# Patient Record
Sex: Female | Born: 1935 | Race: Black or African American | Hispanic: No | Marital: Married | Smoking: Never smoker
Health system: Southern US, Community
[De-identification: ages and names within clinical notes are randomized; demographics above are authoritative.]

## PROBLEM LIST (undated history)

## (undated) ENCOUNTER — Emergency Department (HOSPITAL_COMMUNITY): Discharge status: Absent without leave | Payer: Self-pay

## (undated) DIAGNOSIS — E0590 Thyrotoxicosis, unspecified without thyrotoxic crisis or storm: Secondary | ICD-10-CM

## (undated) DIAGNOSIS — K449 Diaphragmatic hernia without obstruction or gangrene: Secondary | ICD-10-CM

## (undated) DIAGNOSIS — K284 Chronic or unspecified gastrojejunal ulcer with hemorrhage: Secondary | ICD-10-CM

## (undated) DIAGNOSIS — I712 Thoracic aortic aneurysm, without rupture: Secondary | ICD-10-CM

## (undated) DIAGNOSIS — I10 Essential (primary) hypertension: Secondary | ICD-10-CM

## (undated) DIAGNOSIS — C801 Malignant (primary) neoplasm, unspecified: Secondary | ICD-10-CM

## (undated) DIAGNOSIS — I499 Cardiac arrhythmia, unspecified: Secondary | ICD-10-CM

## (undated) DIAGNOSIS — K274 Chronic or unspecified peptic ulcer, site unspecified, with hemorrhage: Secondary | ICD-10-CM

## (undated) HISTORY — DX: Chronic or unspecified gastrojejunal ulcer with hemorrhage: K28.4

## (undated) HISTORY — DX: Thoracic aortic aneurysm, without rupture: I71.2

## (undated) HISTORY — PX: UMBILICAL HERNIA REPAIR: SHX196

## (undated) HISTORY — DX: Cardiac arrhythmia, unspecified: I49.9

## (undated) HISTORY — PX: ANTERIOR CERVICAL DECOMP/DISCECTOMY FUSION: SHX1161

## (undated) HISTORY — PX: NECK SURGERY: SHX720

## (undated) HISTORY — DX: Thyrotoxicosis, unspecified without thyrotoxic crisis or storm: E05.90

## (undated) HISTORY — PX: ROTATOR CUFF REPAIR: SHX139

## (undated) HISTORY — PX: TUBAL LIGATION: SHX77

## (undated) HISTORY — DX: Diaphragmatic hernia without obstruction or gangrene: K44.9

## (undated) HISTORY — DX: Chronic or unspecified peptic ulcer, site unspecified, with hemorrhage: K27.4

---

## 1998-12-05 ENCOUNTER — Emergency Department (HOSPITAL_COMMUNITY): Admission: EM | Admit: 1998-12-05 | Discharge: 1998-12-05 | Payer: Self-pay | Admitting: Emergency Medicine

## 1998-12-05 ENCOUNTER — Encounter: Payer: Self-pay | Admitting: Emergency Medicine

## 1999-05-08 ENCOUNTER — Emergency Department (HOSPITAL_COMMUNITY): Admission: EM | Admit: 1999-05-08 | Discharge: 1999-05-08 | Payer: Self-pay | Admitting: Emergency Medicine

## 1999-10-08 ENCOUNTER — Other Ambulatory Visit: Admission: RE | Admit: 1999-10-08 | Discharge: 1999-10-08 | Payer: Self-pay | Admitting: *Deleted

## 2000-04-12 ENCOUNTER — Other Ambulatory Visit: Admission: RE | Admit: 2000-04-12 | Discharge: 2000-04-12 | Payer: Self-pay | Admitting: Obstetrics and Gynecology

## 2000-05-10 ENCOUNTER — Encounter (INDEPENDENT_AMBULATORY_CARE_PROVIDER_SITE_OTHER): Payer: Self-pay | Admitting: Specialist

## 2000-05-10 ENCOUNTER — Other Ambulatory Visit: Admission: RE | Admit: 2000-05-10 | Discharge: 2000-05-10 | Payer: Self-pay | Admitting: Obstetrics and Gynecology

## 2000-10-10 ENCOUNTER — Other Ambulatory Visit: Admission: RE | Admit: 2000-10-10 | Discharge: 2000-10-10 | Payer: Self-pay | Admitting: Obstetrics and Gynecology

## 2002-11-19 ENCOUNTER — Encounter: Payer: Self-pay | Admitting: Internal Medicine

## 2002-11-19 ENCOUNTER — Ambulatory Visit (HOSPITAL_COMMUNITY): Admission: RE | Admit: 2002-11-19 | Discharge: 2002-11-19 | Payer: Self-pay | Admitting: Internal Medicine

## 2002-11-27 ENCOUNTER — Encounter: Payer: Self-pay | Admitting: Internal Medicine

## 2002-11-27 ENCOUNTER — Ambulatory Visit (HOSPITAL_COMMUNITY): Admission: RE | Admit: 2002-11-27 | Discharge: 2002-11-27 | Payer: Self-pay | Admitting: Internal Medicine

## 2003-05-02 ENCOUNTER — Ambulatory Visit (HOSPITAL_COMMUNITY): Admission: RE | Admit: 2003-05-02 | Discharge: 2003-05-02 | Payer: Self-pay | Admitting: Internal Medicine

## 2003-05-02 ENCOUNTER — Encounter (INDEPENDENT_AMBULATORY_CARE_PROVIDER_SITE_OTHER): Payer: Self-pay | Admitting: Specialist

## 2003-05-02 ENCOUNTER — Encounter: Payer: Self-pay | Admitting: Internal Medicine

## 2003-09-19 ENCOUNTER — Emergency Department (HOSPITAL_COMMUNITY): Admission: EM | Admit: 2003-09-19 | Discharge: 2003-09-19 | Payer: Self-pay | Admitting: Emergency Medicine

## 2004-11-03 ENCOUNTER — Ambulatory Visit (HOSPITAL_COMMUNITY): Admission: RE | Admit: 2004-11-03 | Discharge: 2004-11-03 | Payer: Self-pay | Admitting: Orthopedic Surgery

## 2004-11-26 ENCOUNTER — Encounter: Admission: RE | Admit: 2004-11-26 | Discharge: 2004-11-26 | Payer: Self-pay | Admitting: Orthopedic Surgery

## 2004-12-17 ENCOUNTER — Ambulatory Visit: Payer: Self-pay | Admitting: *Deleted

## 2004-12-17 ENCOUNTER — Encounter (INDEPENDENT_AMBULATORY_CARE_PROVIDER_SITE_OTHER): Payer: Self-pay | Admitting: *Deleted

## 2005-03-01 ENCOUNTER — Encounter: Admission: RE | Admit: 2005-03-01 | Discharge: 2005-03-01 | Payer: Self-pay | Admitting: Orthopedic Surgery

## 2005-04-30 ENCOUNTER — Emergency Department (HOSPITAL_COMMUNITY): Admission: EM | Admit: 2005-04-30 | Discharge: 2005-04-30 | Payer: Self-pay | Admitting: Emergency Medicine

## 2005-09-07 ENCOUNTER — Encounter: Admission: RE | Admit: 2005-09-07 | Discharge: 2005-09-07 | Payer: Self-pay | Admitting: Internal Medicine

## 2005-10-11 ENCOUNTER — Encounter: Admission: RE | Admit: 2005-10-11 | Discharge: 2005-10-11 | Payer: Self-pay | Admitting: Internal Medicine

## 2006-02-19 ENCOUNTER — Emergency Department (HOSPITAL_COMMUNITY): Admission: EM | Admit: 2006-02-19 | Discharge: 2006-02-19 | Payer: Self-pay | Admitting: Family Medicine

## 2006-08-18 ENCOUNTER — Emergency Department (HOSPITAL_COMMUNITY): Admission: EM | Admit: 2006-08-18 | Discharge: 2006-08-18 | Payer: Self-pay | Admitting: Family Medicine

## 2006-08-28 ENCOUNTER — Emergency Department (HOSPITAL_COMMUNITY): Admission: EM | Admit: 2006-08-28 | Discharge: 2006-08-28 | Payer: Self-pay | Admitting: Family Medicine

## 2006-09-01 ENCOUNTER — Encounter: Admission: RE | Admit: 2006-09-01 | Discharge: 2006-09-01 | Payer: Self-pay | Admitting: Internal Medicine

## 2006-10-02 ENCOUNTER — Encounter: Admission: RE | Admit: 2006-10-02 | Discharge: 2006-10-02 | Payer: Self-pay | Admitting: Internal Medicine

## 2006-10-02 ENCOUNTER — Other Ambulatory Visit: Admission: RE | Admit: 2006-10-02 | Discharge: 2006-10-02 | Payer: Self-pay | Admitting: Interventional Radiology

## 2006-10-02 ENCOUNTER — Encounter (INDEPENDENT_AMBULATORY_CARE_PROVIDER_SITE_OTHER): Payer: Self-pay | Admitting: Specialist

## 2006-11-13 ENCOUNTER — Encounter (HOSPITAL_COMMUNITY): Admission: RE | Admit: 2006-11-13 | Discharge: 2007-01-30 | Payer: Self-pay | Admitting: Endocrinology

## 2007-03-05 IMAGING — CR DG CHEST 2V
2 series · 2 of 2 positions shown · non-contrast
Comparison: No priors for comparison.

CLINICAL DATA: Cough and congestion for a couple of weeks.
CHEST- 2 VIEWS - 08/28/06:

[view not recorded (1 of 2)]
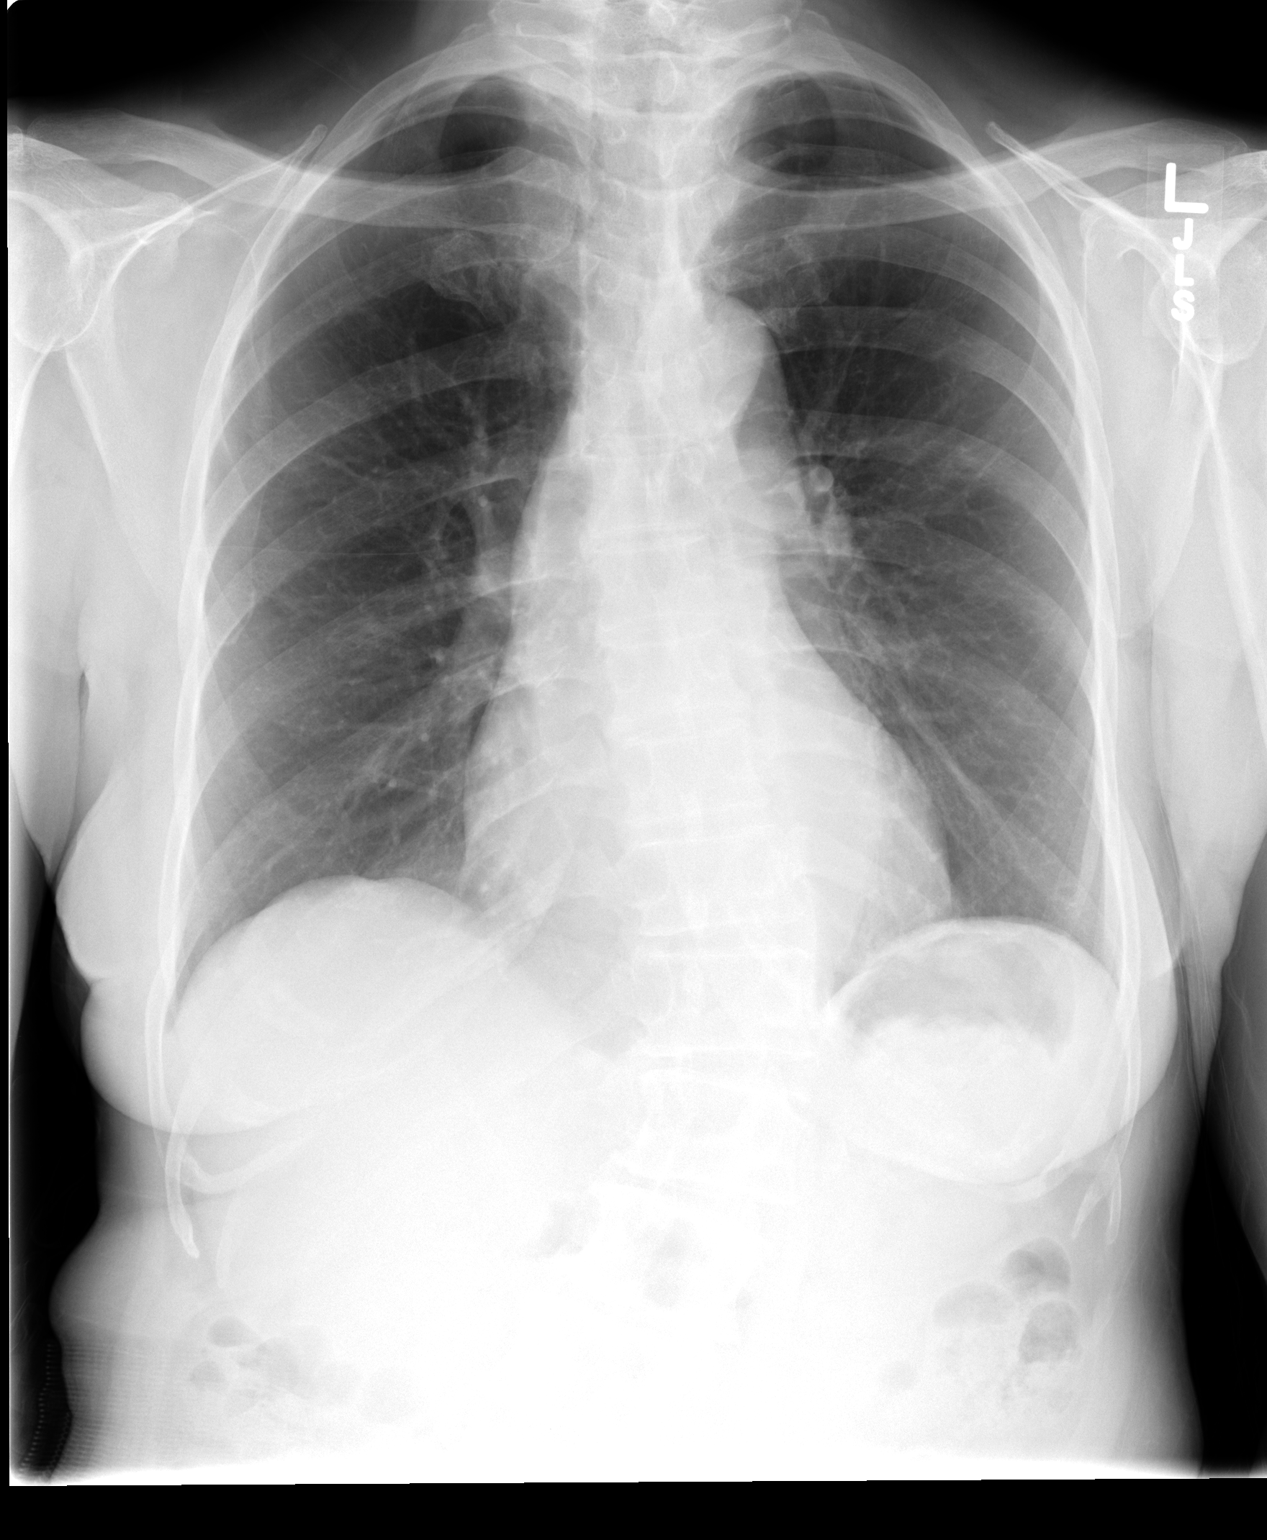

[view not recorded (2 of 2)]
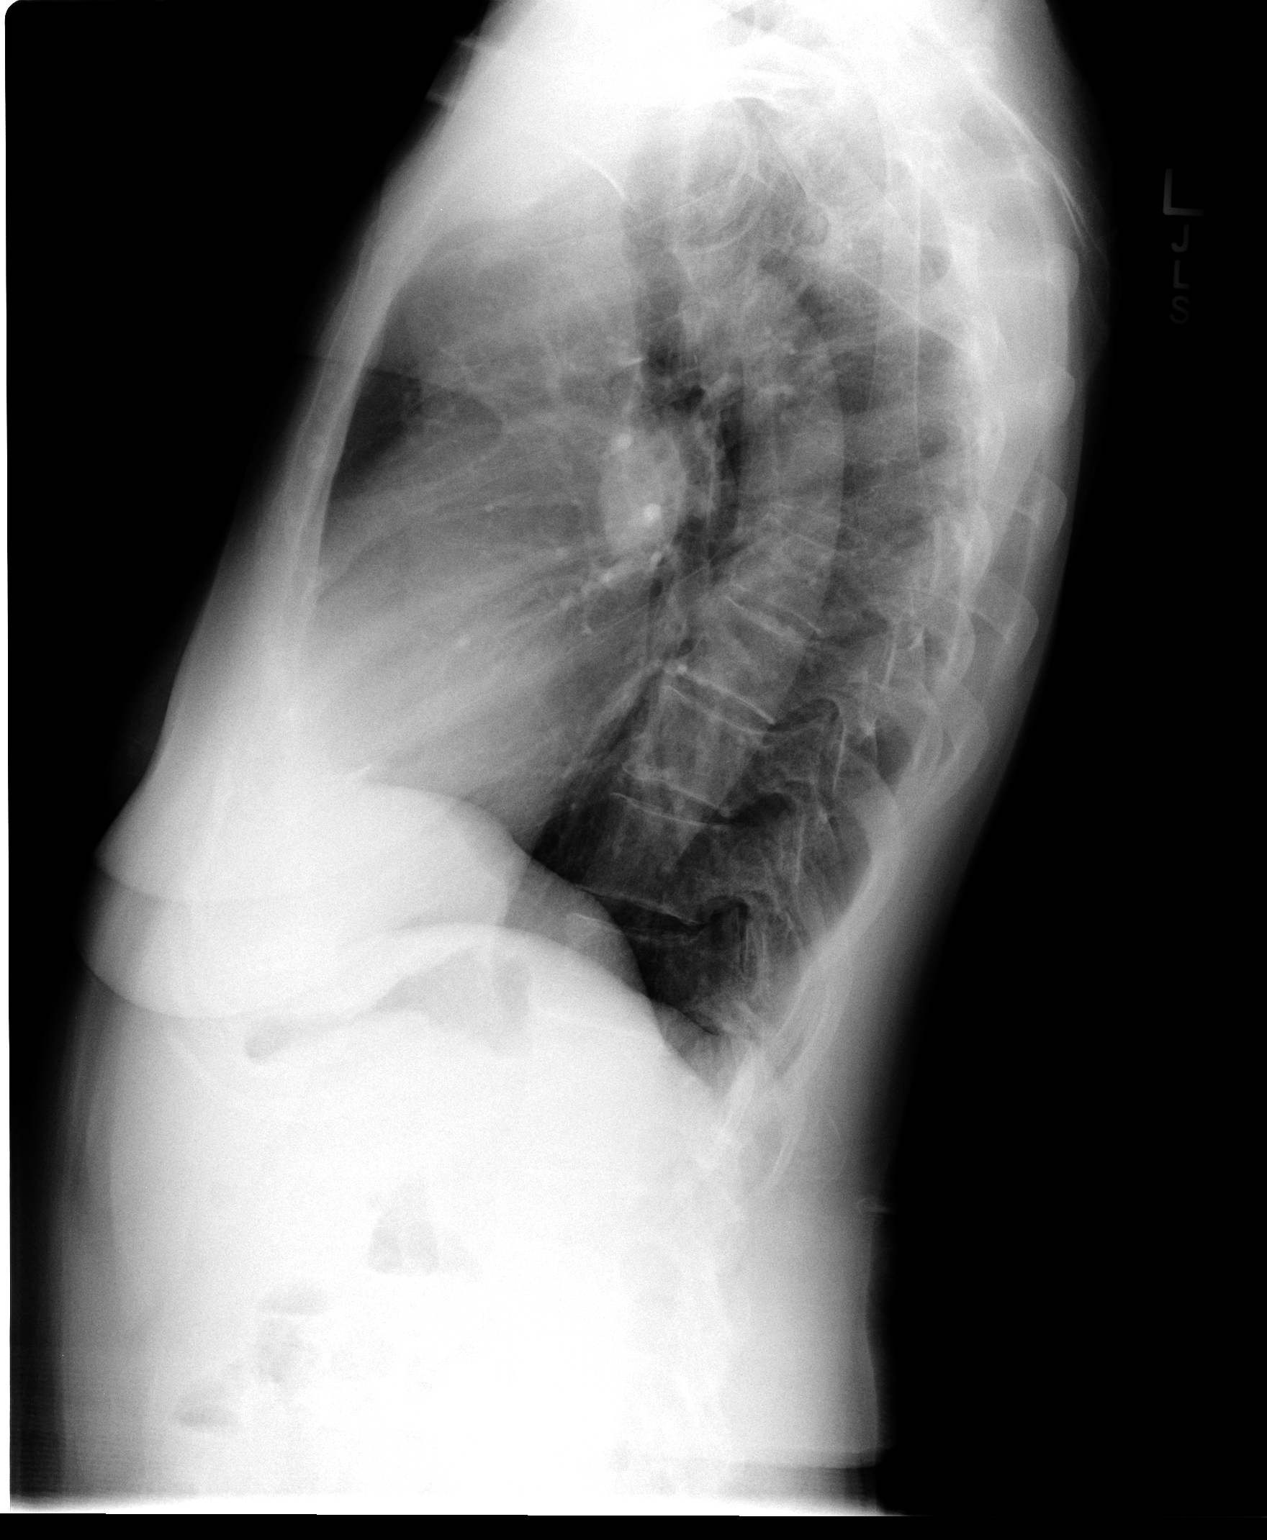

[2 of 2 positions shown; findings below may reference images not displayed]

FINDINGS: There is lower thoracic/upper lumbar rotoscoliosis convex to the left.  The lungs are well expanded and clear of an active process.  The cardiomediastinal silhouette is unremarkable.  Cardiac size is towards the upper limits of normal.
IMPRESSION: No active cardiopulmonary disease. 
Thoracolumbar scoliosis.

## 2007-08-14 ENCOUNTER — Encounter: Admission: RE | Admit: 2007-08-14 | Discharge: 2007-08-14 | Payer: Self-pay | Admitting: Internal Medicine

## 2008-05-05 ENCOUNTER — Emergency Department (HOSPITAL_COMMUNITY): Admission: EM | Admit: 2008-05-05 | Discharge: 2008-05-05 | Payer: Self-pay | Admitting: Family Medicine

## 2009-01-15 ENCOUNTER — Encounter: Admission: RE | Admit: 2009-01-15 | Discharge: 2009-01-15 | Payer: Self-pay | Admitting: Family Medicine

## 2009-01-28 ENCOUNTER — Encounter: Admission: RE | Admit: 2009-01-28 | Discharge: 2009-01-28 | Payer: Self-pay | Admitting: Family Medicine

## 2009-02-10 ENCOUNTER — Encounter: Admission: RE | Admit: 2009-02-10 | Discharge: 2009-02-10 | Payer: Self-pay | Admitting: Family Medicine

## 2009-02-10 ENCOUNTER — Encounter (INDEPENDENT_AMBULATORY_CARE_PROVIDER_SITE_OTHER): Payer: Self-pay | Admitting: Diagnostic Radiology

## 2009-02-10 HISTORY — PX: BREAST BIOPSY: SHX20

## 2009-05-25 ENCOUNTER — Encounter: Payer: Self-pay | Admitting: Cardiology

## 2010-05-25 ENCOUNTER — Emergency Department (HOSPITAL_COMMUNITY): Admission: EM | Admit: 2010-05-25 | Discharge: 2010-05-25 | Payer: Self-pay | Admitting: Family Medicine

## 2010-08-29 ENCOUNTER — Encounter: Payer: Self-pay | Admitting: Family Medicine

## 2010-08-29 ENCOUNTER — Encounter: Payer: Self-pay | Admitting: *Deleted

## 2010-09-08 ENCOUNTER — Encounter: Payer: Self-pay | Admitting: Family Medicine

## 2010-12-07 ENCOUNTER — Inpatient Hospital Stay (INDEPENDENT_AMBULATORY_CARE_PROVIDER_SITE_OTHER)
Admission: RE | Admit: 2010-12-07 | Discharge: 2010-12-07 | Disposition: A | Discharge status: Home / Self Care | Payer: Medicare Other | Source: Ambulatory Visit | Attending: Family Medicine | Admitting: Family Medicine

## 2010-12-07 DIAGNOSIS — J069 Acute upper respiratory infection, unspecified: Secondary | ICD-10-CM

## 2010-12-24 NOTE — Group Therapy Note (Signed)
NAME:  Rebecca Estes, Rebecca Estes              ACCOUNT NO.:  [id number]   MEDICAL RECORD NO.:  [id number]          PATIENT TYPE:  WOC   LOCATION:  [facility]                   FACILITY:  [facility]   PHYSICIAN:  Ellis Parents, MD    DATE OF BIRTH:  07-17-1934   DATE OF SERVICE:                                    CLINIC NOTE   HISTORY OF PRESENT ILLNESS:  A 77 year old multiparous, postmenopausal  patient who comes in for a routine Pap smear.  In addition, she complains of  some occasional mild back pain and lower abdominal fullness.  She denies any  urinary symptoms.  She has nocturia x 1.  She has no stress incontinence.  She voids well with good volumes.  Blood pressure 177/103, and the patient  is currently on an antihypertensive called Novax.  According to the patient,  she had a mammogram one year ago.   PHYSICAL EXAMINATION:  GENITOURINARY:  External genitalia are normal.  There  is a whitish discoloration on the interior surface of both of the labia  minora which is smooth and not ulcerated.  The vagina is atrophic.  The  cervix is clean.  The uterus is anterior, and normal size.  Both adnexa are  soft.  There is no evidence of any pelvic masses or induration.   MEDICAL DECISION MAKING:  The area on the labia minora appears to grossly  represent lichen sclerosus.  The patient was advised of this factor, and she  was also advised to return to her medical doctor to get her blood pressure  stabilized.      SA/MEDQ  D:  12/17/2004  T:  12/17/2004  Job:  [id number]

## 2011-03-10 ENCOUNTER — Emergency Department (HOSPITAL_COMMUNITY)
Admission: EM | Admit: 2011-03-10 | Discharge: 2011-03-11 | Disposition: A | Discharge status: Home / Self Care | Payer: Medicare Other | Attending: Emergency Medicine | Admitting: Emergency Medicine

## 2011-03-10 DIAGNOSIS — I10 Essential (primary) hypertension: Secondary | ICD-10-CM | POA: Insufficient documentation

## 2011-03-10 DIAGNOSIS — M810 Age-related osteoporosis without current pathological fracture: Secondary | ICD-10-CM | POA: Insufficient documentation

## 2011-03-10 DIAGNOSIS — K219 Gastro-esophageal reflux disease without esophagitis: Secondary | ICD-10-CM | POA: Insufficient documentation

## 2011-03-10 DIAGNOSIS — I6789 Other cerebrovascular disease: Secondary | ICD-10-CM | POA: Insufficient documentation

## 2011-03-10 DIAGNOSIS — I498 Other specified cardiac arrhythmias: Secondary | ICD-10-CM | POA: Insufficient documentation

## 2011-03-10 HISTORY — DX: Essential (primary) hypertension: I10

## 2011-03-11 ENCOUNTER — Emergency Department (HOSPITAL_COMMUNITY): Payer: Medicare Other

## 2011-03-11 ENCOUNTER — Encounter (HOSPITAL_COMMUNITY): Payer: Self-pay | Admitting: Radiology

## 2011-05-09 LAB — WET PREP, GENITAL
Trich, Wet Prep: NONE SEEN
Yeast Wet Prep HPF POC: NONE SEEN

## 2011-05-09 LAB — POCT URINALYSIS DIP (DEVICE)
Bilirubin Urine: NEGATIVE
Hgb urine dipstick: NEGATIVE
Ketones, ur: NEGATIVE
Protein, ur: NEGATIVE
Urobilinogen, UA: 0.2
pH: 5

## 2011-05-09 LAB — URINE CULTURE

## 2011-07-16 ENCOUNTER — Encounter (HOSPITAL_COMMUNITY): Payer: Self-pay | Admitting: Emergency Medicine

## 2011-07-16 ENCOUNTER — Emergency Department (INDEPENDENT_AMBULATORY_CARE_PROVIDER_SITE_OTHER)
Admission: EM | Admit: 2011-07-16 | Discharge: 2011-07-16 | Disposition: A | Discharge status: Home / Self Care | Payer: Medicare Other | Source: Home / Self Care | Attending: Family Medicine | Admitting: Family Medicine

## 2011-07-16 DIAGNOSIS — T310 Burns involving less than 10% of body surface: Secondary | ICD-10-CM

## 2011-07-16 MED ORDER — SILVER SULFADIAZINE 1 % EX CREA: TOPICAL_CREAM | Freq: Two times a day (BID) | CUTANEOUS | Status: AC
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
## 2011-10-14 ENCOUNTER — Encounter: Payer: Self-pay | Admitting: Internal Medicine

## 2012-10-25 ENCOUNTER — Encounter: Payer: Self-pay | Admitting: Cardiology

## 2015-04-06 ENCOUNTER — Encounter (HOSPITAL_COMMUNITY): Payer: Self-pay | Admitting: Emergency Medicine

## 2015-04-06 ENCOUNTER — Emergency Department (HOSPITAL_COMMUNITY): Payer: PPO

## 2015-04-06 ENCOUNTER — Emergency Department (HOSPITAL_COMMUNITY)
Admission: EM | Admit: 2015-04-06 | Discharge: 2015-04-06 | Disposition: A | Discharge status: Home / Self Care | Payer: PPO | Attending: Emergency Medicine | Admitting: Emergency Medicine

## 2015-04-06 DIAGNOSIS — M62838 Other muscle spasm: Secondary | ICD-10-CM | POA: Diagnosis not present

## 2015-04-06 DIAGNOSIS — Z8711 Personal history of peptic ulcer disease: Secondary | ICD-10-CM | POA: Insufficient documentation

## 2015-04-06 DIAGNOSIS — I10 Essential (primary) hypertension: Secondary | ICD-10-CM | POA: Insufficient documentation

## 2015-04-06 DIAGNOSIS — M810 Age-related osteoporosis without current pathological fracture: Secondary | ICD-10-CM | POA: Insufficient documentation

## 2015-04-06 DIAGNOSIS — Z8719 Personal history of other diseases of the digestive system: Secondary | ICD-10-CM | POA: Diagnosis not present

## 2015-04-06 DIAGNOSIS — Z79899 Other long term (current) drug therapy: Secondary | ICD-10-CM | POA: Diagnosis not present

## 2015-04-06 DIAGNOSIS — Z8349 Family history of other endocrine, nutritional and metabolic diseases: Secondary | ICD-10-CM | POA: Diagnosis not present

## 2015-04-06 DIAGNOSIS — Z720 Tobacco use: Secondary | ICD-10-CM | POA: Insufficient documentation

## 2015-04-06 DIAGNOSIS — M25512 Pain in left shoulder: Secondary | ICD-10-CM | POA: Diagnosis present

## 2015-04-06 MED ORDER — CYCLOBENZAPRINE HCL 10 MG PO TABS
10.0000 mg | ORAL_TABLET | Freq: Once | ORAL | Status: AC
Start: 2015-04-06 — End: 2015-04-06
  Administered 2015-04-06: 10 mg via ORAL
  Filled 2015-04-06: qty 1

## 2015-04-06 MED ORDER — CYCLOBENZAPRINE HCL 10 MG PO TABS: 10.0000 mg | ORAL_TABLET | Freq: Two times a day (BID) | ORAL | Status: DC | PRN

## 2015-04-06 NOTE — ED Provider Notes (Signed)
CSN: [id number]     Arrival date & time 04/06/15  0727 History   First MD Initiated Contact with Patient 04/06/15 [phone]     Chief Complaint  Patient presents with  . Shoulder Pain     (Consider location/radiation/quality/duration/timing/severity/associated sxs/prior Treatment) Patient is a 81 y.o. female presenting with shoulder pain. The history is provided by the patient.  Shoulder Pain Location:  Shoulder Time since incident:  6 days Injury: no   Shoulder location:  L shoulder Pain details:    Quality:  Aching   Radiates to:  Does not radiate   Severity:  Moderate   Timing:  Constant   Progression:  Unchanged Chronicity:  New Relieved by:  Nothing Worsened by:  Nothing tried Associated symptoms: no back pain, no fatigue and no fever     Past Medical History  Diagnosis Date  . Hypertension   . Hiatal hernia   . Arrhythmia   . Osteoporosis   . Bleeding ulcer   . Hyperthyroidism    Past Surgical History  Procedure Laterality Date  . Neck surgery    . Umbilical hernia repair    . Anterior cervical decomp/discectomy fusion      x3  . Rotator cuff repair      Left   Family History  Problem Relation Age of Onset  . Aneurysm Father 51    Brain  . Other Mother 49    Blood Infection  . Coronary artery disease Brother 62  . Stroke Brother 69  . Aneurysm Sister 42    Brain  . Cervical cancer Sister 5  . Kidney failure Sister 73    was on Dialysis  . Aneurysm Sister     Brain   Social History  Substance Use Topics  . Smoking status: Current Every Day Smoker  . Smokeless tobacco: None  . Alcohol Use: No   OB History    No data available     Review of Systems  Constitutional: Negative for fever and fatigue.  Respiratory: Negative for cough and shortness of breath.   Gastrointestinal: Negative for vomiting.  Musculoskeletal: Negative for back pain.  All other systems reviewed and are negative.     Allergies  Review of patient's allergies indicates  no known allergies.  Home Medications   Prior to Admission medications   Medication Sig Start Date End Date Taking? Authorizing Provider  alendronate (FOSAMAX) 10 MG tablet Take 10 mg by mouth daily before breakfast. Take with a full glass of water on an empty stomach.     Historical Provider, MD  amLODipine (NORVASC) 10 MG tablet Take 10 mg by mouth daily.      Historical Provider, MD  cholecalciferol (VITAMIN D) 1000 UNITS tablet Take 1,000 Units by mouth daily. Every 2 weeks     Historical Provider, MD  metoprolol (LOPRESSOR) 100 MG tablet Take 100 mg by mouth 1 day or 1 dose.      Historical Provider, MD   BP 167/81 mmHg  Pulse 76  Temp(Src) 97.8 F (36.6 C) (Oral)  Resp 18  Ht 5\' 5"  (1.651 m)  Wt 137 lb (62.143 kg)  BMI 22.80 kg/m2  SpO2 100% Physical Exam  Constitutional: She is oriented to person, place, and time. She appears well-developed and well-nourished. No distress.  HENT:  Head: Normocephalic and atraumatic.  Mouth/Throat: Oropharynx is clear and moist.  Eyes: EOM are normal. Pupils are equal, round, and reactive to light.  Neck: Normal range of motion. Neck supple.  Cardiovascular: Normal rate and regular rhythm.  Exam reveals no friction rub.   No murmur heard. Pulmonary/Chest: Effort normal and breath sounds normal. No respiratory distress. She has no wheezes. She has no rales.  Abdominal: Soft. She exhibits no distension. There is no tenderness. There is no rebound.  Musculoskeletal: Normal range of motion. She exhibits no edema.       Thoracic back: She exhibits tenderness (L lower trapezius, L lower latera trapezius.). She exhibits no spasm.  No rash  Neurological: She is alert and oriented to person, place, and time. No cranial nerve deficit. She exhibits normal muscle tone.  Skin: No rash noted. She is not diaphoretic.  Nursing note and vitals reviewed.   ED Course  Procedures (including critical care time) Labs Review Labs Reviewed - No data to  display  Imaging Review Dg Chest 2 View  04/06/2015   CLINICAL DATA:  Left scapular and posterior chest pain for 5 days. Initial encounter.  EXAM: CHEST  2 VIEW  COMPARISON:  PA and lateral chest 08/28/2006.  FINDINGS: The lungs are clear. Heart size is normal. No pneumothorax or pleural effusion. Convex left thoracolumbar scoliosis is unchanged.  IMPRESSION: No acute disease.   Electronically Signed   By: Inge Rise M.D.   On: 04/06/2015 08:29   I have personally reviewed and evaluated these images and lab results as part of my medical decision-making.   EKG Interpretation   Date/Time:  Monday April 06 2015 08:07:57 EDT Ventricular Rate:  67 PR Interval:  156 QRS Duration: 84 QT Interval:  406 QTC Calculation: 429 R Axis:   17 Text Interpretation:  Sinus rhythm Abnormal R-wave progression, early  transition No significant change since last tracing Confirmed by Mingo Amber   MD, Daune Divirgilio ([id number]) on 04/06/2015 8:11:00 AM      MDM   Final diagnoses:  Muscle spasm    81 year old female here with left posterior shoulder blade pain. Present for the past despite 6 days. Has been constant. Had mild relief with ibuprofen. Started after her son passed away. She denies any chest pain, fever, shortness of breath. No vomiting, abdominal pain. No falls. No exacerbating factors. Here vitals are stable. EKG is normal. She has mild left lower trapezius pain and muscular tenderness under the shoulder blade. Without any cardiac signs or symptoms, I feel like this is likely muscle tension secondary to stress with her son's recent death. We'll check a chest x-ray and give Flexeril.  CXR ok. Stable for discharge.     Evelina Bucy, MD 04/06/15 [phone]

## 2015-04-06 NOTE — ED Notes (Signed)
Pt states she normally gets this shoulder pain and she takes Ibuprofen and it goes away.  This morning the ibuprofen did not work.  Pt denies CP

## 2015-04-06 NOTE — ED Notes (Signed)
Discharge paperwork written on wrong patient MRN

## 2015-07-08 ENCOUNTER — Other Ambulatory Visit: Payer: Self-pay

## 2015-07-08 DIAGNOSIS — Z1231 Encounter for screening mammogram for malignant neoplasm of breast: Secondary | ICD-10-CM

## 2015-07-28 ENCOUNTER — Ambulatory Visit: Payer: PPO

## 2015-08-11 ENCOUNTER — Ambulatory Visit
Admission: RE | Admit: 2015-08-11 | Discharge: 2015-08-11 | Disposition: A | Discharge status: Home / Self Care | Payer: PPO | Source: Ambulatory Visit

## 2015-08-11 DIAGNOSIS — Z1231 Encounter for screening mammogram for malignant neoplasm of breast: Secondary | ICD-10-CM

## 2015-10-05 ENCOUNTER — Encounter (HOSPITAL_COMMUNITY): Payer: Self-pay | Admitting: Emergency Medicine

## 2015-10-05 ENCOUNTER — Emergency Department (INDEPENDENT_AMBULATORY_CARE_PROVIDER_SITE_OTHER)
Admission: EM | Admit: 2015-10-05 | Discharge: 2015-10-05 | Disposition: A | Discharge status: Home / Self Care | Payer: PPO | Source: Home / Self Care | Attending: Family Medicine | Admitting: Family Medicine

## 2015-10-05 ENCOUNTER — Emergency Department (INDEPENDENT_AMBULATORY_CARE_PROVIDER_SITE_OTHER): Payer: PPO

## 2015-10-05 DIAGNOSIS — R05 Cough: Secondary | ICD-10-CM | POA: Diagnosis not present

## 2015-10-05 DIAGNOSIS — J069 Acute upper respiratory infection, unspecified: Secondary | ICD-10-CM | POA: Diagnosis not present

## 2015-10-05 DIAGNOSIS — R509 Fever, unspecified: Secondary | ICD-10-CM | POA: Diagnosis not present

## 2015-10-05 MED ORDER — AMOXICILLIN 250 MG PO CAPS: 250.0000 mg | ORAL_CAPSULE | Freq: Three times a day (TID) | ORAL | Status: AC

## 2015-10-05 NOTE — ED Notes (Signed)
Pt has been suffering from a cough for one month.  She denies any other symptoms at this time and denies fever at any time.

## 2015-10-05 NOTE — ED Provider Notes (Signed)
CSN: [id number]     Arrival date & time 10/05/15  1426 History   First MD Initiated Contact with Patient 10/05/15 1623     Chief Complaint  Patient presents with  . Cough   (Consider location/radiation/quality/duration/timing/severity/associated sxs/prior Treatment) HPI Pt presents with the cc of cough for over 2 months Symptoms started months ago URI symptoms now has lingering cough Symptoms get worse with exertion Symptoms get better with rest and when she takes cough medicine No previous symptoms of this nature Pain score 1 Other symptoms include sputum production  Past Medical History  Diagnosis Date  . Hypertension   . Hiatal hernia   . Arrhythmia   . Osteoporosis   . Bleeding ulcer   . Hyperthyroidism    Past Surgical History  Procedure Laterality Date  . Neck surgery    . Umbilical hernia repair    . Anterior cervical decomp/discectomy fusion      x3  . Rotator cuff repair      Left   Family History  Problem Relation Age of Onset  . Aneurysm Father 8    Brain  . Other Mother 41    Blood Infection  . Coronary artery disease Brother 59  . Stroke Brother 28  . Aneurysm Sister 67    Brain  . Cervical cancer Sister 7  . Kidney failure Sister 61    was on Dialysis  . Aneurysm Sister     Brain   Social History  Substance Use Topics  . Smoking status: Former Research scientist (life sciences)  . Smokeless tobacco: None  . Alcohol Use: No   OB History    No data available     Review of Systems ROS +'ve cough  Denies: HEADACHE, NAUSEA, ABDOMINAL PAIN, CHEST PAIN, CONGESTION, DYSURIA, SHORTNESS OF BREATH  Allergies  Review of patient's allergies indicates no known allergies.  Home Medications   Prior to Admission medications   Medication Sig Start Date End Date Taking? Authorizing Provider  amLODipine (NORVASC) 10 MG tablet Take 10 mg by mouth daily.     Yes Historical Provider, MD  metoprolol (LOPRESSOR) 100 MG tablet Take 100 mg by mouth 1 day or 1 dose.     Yes  Historical Provider, MD  alendronate (FOSAMAX) 10 MG tablet Take 10 mg by mouth daily before breakfast. Take with a full glass of water on an empty stomach.     Historical Provider, MD  amoxicillin (AMOXIL) 250 MG capsule Take 1 capsule (250 mg total) by mouth 3 (three) times daily. 10/05/15 10/12/15  Konrad Felix, PA  cholecalciferol (VITAMIN D) 1000 UNITS tablet Take 1,000 Units by mouth daily. Every 2 weeks     Historical Provider, MD  cyclobenzaprine (FLEXERIL) 10 MG tablet Take 1 tablet (10 mg total) by mouth 2 (two) times daily as needed for muscle spasms. 04/06/15   Evelina Bucy, MD   Meds Ordered and Administered this Visit  Medications - No data to display  BP 152/85 mmHg  Pulse 57  Temp(Src) 98.2 F (36.8 C) (Oral)  Resp 16  SpO2 99% No data found.   Physical Exam NURSES NOTES AND VITAL SIGNS REVIEWED. CONSTITUTIONAL: Well developed, well nourished, no acute distress HEENT: normocephalic, atraumatic, right and left TM's are normal EYES: Conjunctiva normal NECK:normal ROM, supple, no adenopathy PULMONARY:No respiratory distress, normal effort, Lungs: CTAb/l, no wheezes, or increased work of breathing CARDIOVASCULAR: RRR, no murmur ABDOMEN: soft, ND, NT, +'ve BS MUSCULOSKELETAL: Normal ROM of all extremities,  SKIN: warm and dry  without rash PSYCHIATRIC: Mood and affect, behavior are normal  ED Course  Procedures (including critical care time)  Labs Review Labs Reviewed - No data to display  Imaging Review Dg Chest 2 View  10/05/2015  CLINICAL DATA:  Pt sick x 4 weeks, cough, chills, no fever, non smoker, hx of bronchitis, no asthma EXAM: CHEST  2 VIEW COMPARISON:  04/06/2015 FINDINGS: Cardiac silhouette top-normal in size. Aorta is mildly uncoiled. No mediastinal or hilar masses or evidence of adenopathy. Clear lungs.  No pleural effusion pneumothorax. Skeletal structures are intact. IMPRESSION: No active cardiopulmonary disease. Electronically Signed   By: Lajean Manes M.D.   On: 10/05/2015 16:34     Visual Acuity Review  Right Eye Distance:   Left Eye Distance:   Bilateral Distance:    Right Eye Near:   Left Eye Near:    Bilateral Near:        I have reviewed chest x-ray results with patient there is no acute pneumonia. Because of her age prophylaxis we'll treat with antibiotics. She should follow-up with her primary care provider also. MDM   1. URI (upper respiratory infection)    Patient is reassured that there are no issues that require transfer to higher level of care at this time.  Patient is advised to continue home symptomatic treatment. Prescription is sent to  pharmacy patient has indicated.  Patient is advised that if there are new or worsening symptoms or attend the emergency department, or contact primary care provider. Instructions of care provided discharged home in stable condition.    THIS NOTE WAS GENERATED USING A VOICE RECOGNITION SOFTWARE PROGRAM. ALL REASONABLE EFFORTS  WERE MADE TO PROOFREAD THIS DOCUMENT FOR ACCURACY.     Konrad Felix, [place] 10/05/15 [phone]

## 2015-10-05 NOTE — Discharge Instructions (Signed)

## 2016-03-10 ENCOUNTER — Encounter (HOSPITAL_COMMUNITY): Payer: Self-pay | Admitting: Emergency Medicine

## 2016-03-10 ENCOUNTER — Ambulatory Visit (HOSPITAL_COMMUNITY)
Admission: EM | Admit: 2016-03-10 | Discharge: 2016-03-10 | Disposition: A | Discharge status: Home / Self Care | Payer: PPO | Attending: Emergency Medicine | Admitting: Emergency Medicine

## 2016-03-10 DIAGNOSIS — R0982 Postnasal drip: Secondary | ICD-10-CM

## 2016-03-10 HISTORY — DX: Malignant (primary) neoplasm, unspecified: C80.1

## 2016-03-10 MED ORDER — LORATADINE 10 MG PO TABS: 10.0000 mg | ORAL_TABLET | Freq: Every day | ORAL | 0 refills | Status: DC

## 2016-03-10 MED ORDER — FLUTICASONE PROPIONATE 50 MCG/ACT NA SUSP: 2.0000 | Freq: Every day | NASAL | 0 refills | Status: DC

## 2016-03-10 MED ORDER — AMLODIPINE BESYLATE 10 MG PO TABS: 10.0000 mg | ORAL_TABLET | Freq: Every day | ORAL | 1 refills | Status: DC

## 2016-03-10 MED ORDER — METOPROLOL TARTRATE 100 MG PO TABS: 100.0000 mg | ORAL_TABLET | Freq: Every day | ORAL | 1 refills | Status: DC

## 2016-03-10 NOTE — ED Triage Notes (Signed)
PT reports a cold several weeks ago and now has ear and throat pain. PT also reports that she is off her BP med because her PCP has changed

## 2016-03-10 NOTE — ED Provider Notes (Signed)
[place]    CSN: [id number] Arrival date & time: 03/10/16  1438  First Provider Contact:  First MD Initiated Contact with Patient 03/10/16 1543        History   Chief Complaint Chief Complaint  Patient presents with  . Sore Throat    HPI LACARA Estes is a 82 y.o. female.   She is an 82 year old woman here for evaluation of throat drainage. She states since having an upper respiratory infection this winter, she has had persistent drainage. She states this is worse at night and gets better during the day. She reports feeling a lot of phlegm in her throat. Denies actual pain or difficulty swallowing. She also reports some discomfort in her ears, primarily the right ear. She states it feels stopped up. Denies any fevers. She does have a little bit of cough, but this is nonproductive. She is not currently taking anything. She also reports that she needs refills of her amlodipine and metoprolol as she is in the process of switching PCPs. She has no point with her new PCP September 11.    Past Medical History:  Diagnosis Date  . Arrhythmia   . Bleeding ulcer   . Cancer ([place])   . Hiatal hernia   . Hypertension   . Hyperthyroidism   . Osteoporosis     There are no active problems to display for this patient.   Past Surgical History:  Procedure Laterality Date  . ANTERIOR CERVICAL DECOMP/DISCECTOMY FUSION     x3  . NECK SURGERY    . ROTATOR CUFF REPAIR     Left  . UMBILICAL HERNIA REPAIR      OB History    No data available       Home Medications    Prior to Admission medications   Medication Sig Start Date End Date Taking? Authorizing Provider  alendronate (FOSAMAX) 10 MG tablet Take 10 mg by mouth daily before breakfast. Take with a full glass of water on an empty stomach.     Historical Provider, MD  amLODipine (NORVASC) 10 MG tablet Take 1 tablet (10 mg total) by mouth daily. 03/10/16   Melony Overly, MD  cholecalciferol (VITAMIN D) 1000 UNITS  tablet Take 1,000 Units by mouth daily. Every 2 weeks     Historical Provider, MD  cyclobenzaprine (FLEXERIL) 10 MG tablet Take 1 tablet (10 mg total) by mouth 2 (two) times daily as needed for muscle spasms. 04/06/15   Evelina Bucy, MD  fluticasone (FLONASE) 50 MCG/ACT nasal spray Place 2 sprays into both nostrils daily. 03/10/16   Melony Overly, MD  loratadine (CLARITIN) 10 MG tablet Take 1 tablet (10 mg total) by mouth daily. 03/10/16   Melony Overly, MD  metoprolol (LOPRESSOR) 100 MG tablet Take 1 tablet (100 mg total) by mouth daily. 03/10/16   Melony Overly, MD    Family History Family History  Problem Relation Age of Onset  . Aneurysm Father 13    Brain  . Other Mother 33    Blood Infection  . Coronary artery disease Brother 53  . Stroke Brother 64  . Aneurysm Sister 15    Brain  . Cervical cancer Sister 55  . Kidney failure Sister 43    was on Dialysis  . Aneurysm Sister     Brain    Social History Social History  Substance Use Topics  . Smoking status: Former Research scientist (life sciences)  . Smokeless tobacco: Never Used  . Alcohol  use No     Allergies   Review of patient's allergies indicates no known allergies.   Review of Systems Review of Systems  Constitutional: Negative for fever.  HENT: Positive for ear pain (right) and postnasal drip. Negative for congestion, rhinorrhea, sore throat, trouble swallowing and voice change.   Respiratory: Positive for cough. Negative for shortness of breath.      Physical Exam Triage Vital Signs ED Triage Vitals  Enc Vitals Group     BP 03/10/16 1530 167/84     Pulse Rate 03/10/16 1530 66     Resp 03/10/16 1530 16     Temp 03/10/16 1530 98.4 F (36.9 C)     Temp Source 03/10/16 1530 Oral     SpO2 03/10/16 1530 99 %     Weight --      Height --      Head Circumference --      Peak Flow --      Pain Score 03/10/16 1542 6     Pain Loc --      Pain Edu? --      Excl. in [place]? --    No data found.   Updated Vital Signs BP 167/84   Pulse 66    Temp 98.4 F (36.9 C) (Oral)   Resp 16   SpO2 99%   Visual Acuity Right Eye Distance:   Left Eye Distance:   Bilateral Distance:    Right Eye Near:   Left Eye Near:    Bilateral Near:     Physical Exam  Constitutional: She appears well-developed and well-nourished. No distress.  HENT:  Nose: Nose normal.  Mouth/Throat: No oropharyngeal exudate.  Right hip obscured by earwax. Left TM is normal. Oropharynx clear, with some postnasal drainage seen.  Cardiovascular: Normal rate, regular rhythm and normal heart sounds.   No murmur heard. Pulmonary/Chest: Effort normal and breath sounds normal. She has no wheezes. She has no rales.  Lymphadenopathy:    She has no cervical adenopathy.     UC Treatments / Results  Labs (all labs ordered are listed, but only abnormal results are displayed) Labs Reviewed - No data to display  EKG  EKG Interpretation None       Radiology No results found.  Procedures Procedures (including critical care time)  Medications Ordered in UC Medications - No data to display   Initial Impression / Assessment and Plan / UC Course  I have reviewed the triage vital signs and the nursing notes.  Pertinent labs & imaging results that were available during my care of the patient were reviewed by me and considered in my medical decision making (see chart for details).  Clinical Course    Symptoms likely coming from postnasal drainage. We'll treat with loratadine and Flonase. Right ear washed out. Prescriptions provided for her metoprolol and amlodipine to last till her new PCP appointment.  Final Clinical Impressions(s) / UC Diagnoses   Final diagnoses:  PND (post-nasal drip)    New Prescriptions New Prescriptions   FLUTICASONE (FLONASE) 50 MCG/ACT NASAL SPRAY    Place 2 sprays into both nostrils daily.   LORATADINE (CLARITIN) 10 MG TABLET    Take 1 tablet (10 mg total) by mouth daily.     Melony Overly, MD 03/10/16 [phone]

## 2016-03-10 NOTE — Discharge Instructions (Signed)
Your phlegm is coming from sinus drainage. Take loratadine daily for the next month. Use the Flonase daily for the next month.  I provided refills for your amlodipine and metoprolol to last until you see her new PCP.  Follow-up as needed.

## 2016-04-18 ENCOUNTER — Ambulatory Visit: Payer: PPO | Admitting: Family

## 2016-05-26 ENCOUNTER — Ambulatory Visit (INDEPENDENT_AMBULATORY_CARE_PROVIDER_SITE_OTHER)
Admission: RE | Admit: 2016-05-26 | Discharge: 2016-05-26 | Disposition: A | Discharge status: Home / Self Care | Payer: PPO | Source: Ambulatory Visit | Attending: Family | Admitting: Family

## 2016-05-26 ENCOUNTER — Ambulatory Visit (INDEPENDENT_AMBULATORY_CARE_PROVIDER_SITE_OTHER): Payer: PPO | Admitting: Family

## 2016-05-26 ENCOUNTER — Encounter: Payer: Self-pay | Admitting: Family

## 2016-05-26 ENCOUNTER — Other Ambulatory Visit (INDEPENDENT_AMBULATORY_CARE_PROVIDER_SITE_OTHER): Payer: PPO

## 2016-05-26 VITALS — BP 160/82 | HR 45 | Temp 98.3°F | Resp 16 | Ht 65.0 in | Wt 125.8 lb

## 2016-05-26 DIAGNOSIS — M810 Age-related osteoporosis without current pathological fracture: Secondary | ICD-10-CM

## 2016-05-26 DIAGNOSIS — E039 Hypothyroidism, unspecified: Secondary | ICD-10-CM

## 2016-05-26 DIAGNOSIS — I10 Essential (primary) hypertension: Secondary | ICD-10-CM

## 2016-05-26 DIAGNOSIS — Z136 Encounter for screening for cardiovascular disorders: Secondary | ICD-10-CM

## 2016-05-26 DIAGNOSIS — E0590 Thyrotoxicosis, unspecified without thyrotoxic crisis or storm: Secondary | ICD-10-CM | POA: Insufficient documentation

## 2016-05-26 LAB — COMPREHENSIVE METABOLIC PANEL
ALBUMIN: 4.3 g/dL (ref 3.5–5.2)
ALK PHOS: 63 U/L (ref 39–117)
ALT: 20 U/L (ref 0–35)
AST: 20 U/L (ref 0–37)
BILIRUBIN TOTAL: 0.4 mg/dL (ref 0.2–1.2)
BUN: 16 mg/dL (ref 6–23)
CALCIUM: 9.8 mg/dL (ref 8.4–10.5)
CO2: 26 meq/L (ref 19–32)
Chloride: 106 mEq/L (ref 96–112)
Creatinine, Ser: 0.69 mg/dL (ref 0.40–1.20)
GFR: 105.12 mL/min (ref >60.00)
Glucose, Bld: 98 mg/dL (ref 70–99)
Potassium: 4 mEq/L (ref 3.5–5.1)
Sodium: 143 mEq/L (ref 135–145)
Total Protein: 7.4 g/dL (ref 6.0–8.3)

## 2016-05-26 LAB — LIPID PANEL
CHOLESTEROL: 224 mg/dL — AB (ref 0–200)
HDL: 85 mg/dL (ref >39.00)
LDL Cholesterol: 124 mg/dL — ABNORMAL HIGH (ref 0–99)
NonHDL: 138.9
TRIGLYCERIDES: 76 mg/dL (ref 0.0–149.0)
Total CHOL/HDL Ratio: 3
VLDL: 15.2 mg/dL (ref 0.0–40.0)

## 2016-05-26 LAB — VITAMIN D 25 HYDROXY (VIT D DEFICIENCY, FRACTURES): VITD: 50.43 ng/mL (ref 30.00–100.00)

## 2016-05-26 LAB — TSH: TSH: 0.14 u[IU]/mL — ABNORMAL LOW (ref 0.35–4.50)

## 2016-05-26 NOTE — Assessment & Plan Note (Signed)
Per patient previously diagnosed with thyroid issues. Obtain TSH to determine current status. No current symptoms. Continue to monitor pending TSH results.

## 2016-05-26 NOTE — Patient Instructions (Addendum)
Thank you for choosing [facility].  SUMMARY AND INSTRUCTIONS:  Please continue to take your medications as prescribed.  Recommend Boost / Ensure to help with weight.  Continue with ice/heat for your hip as well as Tylenol as needed for discomfort.  We will contact you regarding the Prolia for your osteoporosis if indicated.    Medication:  Continue to take the amlodipine which has been sent to his pharmacy.   Your prescription(s) have been submitted to your pharmacy or been printed and provided for you. Please take as directed and contact our office if you believe you are having problem(s) with the medication(s) or have any questions.  Labs:  Please stop by the lab on the lower level of the building for your blood work. Your results will be released to [facility] (or called to you) after review, usually within 72 hours after test completion. If any changes need to be made, you will be notified at that same time.  1.) The lab is open from 7:30am to 5:30 pm Monday-Friday 2.) No appointment is necessary 3.) Fasting (if needed) is 6-8 hours after food and drink; black coffee and water are okay   Imaging / Radiology:  Please stop by radiology on the basement level of the building for your x-rays. Your results will be released to [facility] (or called to you) after review, usually within 72 hours after test completion. If any treatments or changes are necessary, you will be notified at that same time.  Follow up:  If your symptoms worsen or fail to improve, please contact our office for further instruction, or in case of emergency go directly to the emergency room at the closest medical facility.    Osteoporosis Osteoporosis is the thinning and loss of density in the bones. Osteoporosis makes the bones more brittle, fragile, and likely to break (fracture). Over time, osteoporosis can cause the bones to become so weak that they fracture after a simple fall. The bones most likely to  fracture are the bones in the hip, wrist, and spine. CAUSES  The exact cause is not known. RISK FACTORS Anyone can develop osteoporosis. You may be at greater risk if you have a family history of the condition or have poor nutrition. You may also have a higher risk if you are:   Female.   53 years old or older.  A smoker.  Not physically active.   White or Asian.  Slender. SIGNS AND SYMPTOMS  A fracture might be the first sign of the disease, especially if it results from a fall or injury that would not usually cause a bone to break. Other signs and symptoms include:   Low back and neck pain.  Stooped posture.  Height loss. DIAGNOSIS  To make a diagnosis, your health care provider may:  Take a medical history.  Perform a physical exam.  Order tests, such as:  A bone mineral density test.  A dual-energy X-ray absorptiometry test. TREATMENT  The goal of osteoporosis treatment is to strengthen your bones to reduce your risk of a fracture. Treatment may involve:  Making lifestyle changes, such as:  Eating a diet rich in calcium.  Doing weight-bearing and muscle-strengthening exercises.  Stopping tobacco use.  Limiting alcohol intake.  Taking medicine to slow the process of bone loss or to increase bone density.  Monitoring your levels of calcium and vitamin D. HOME CARE INSTRUCTIONS  Include calcium and vitamin D in your diet. Calcium is important for bone health, and vitamin D  helps the body absorb calcium.  Perform weight-bearing and muscle-strengthening exercises as directed by your health care provider.  Do not use any tobacco products, including cigarettes, chewing tobacco, and electronic cigarettes. If you need help quitting, ask your health care provider.  Limit your alcohol intake.  Take medicines only as directed by your health care provider.  Keep all follow-up visits as directed by your health care provider. This is important.  Take  precautions at home to lower your risk of falling, such as:  Keeping rooms well lit and clutter free.  Installing safety rails on stairs.  Using rubber mats in the bathroom and other areas that are often wet or slippery. SEEK IMMEDIATE MEDICAL CARE IF:  You fall or injure yourself.    This information is not intended to replace advice given to you by your health care provider. Make sure you discuss any questions you have with your health care provider.   Document Released: 05/04/2005 Document Revised: 08/15/2014 Document Reviewed: 01/02/2014 Elsevier Interactive Patient Education [employer].

## 2016-05-26 NOTE — Assessment & Plan Note (Signed)
Previously diagnosed with osteoporosis and prescribed Fosamax which she has not taken in several weeks. Obtain bone mineral density test. Hold Fosamax pending bone density test and possible starting of Prolia. Continue vitamin D supplementation. Obtain vitamin D levels.

## 2016-05-26 NOTE — Assessment & Plan Note (Signed)
Blood pressure remains above goal 150/90 with current regimen although has not been taking her amlodipine secondary to running out of it. Continue current dosage of amlodipine. Reduced dose of metoprolol to 50 mg given heart rate of 45. She is asymptomatic. Denies worse headache of her life or symptoms of end organ damage. Obtain complete metabolic profile to check kidney function. Encouraged to follow low-sodium diet. Continue to monitor.

## 2016-05-26 NOTE — Progress Notes (Signed)
Subjective:    Patient ID: Rebecca Estes, female    DOB: [birth year], 82 y.o.   MRN: [id number]  Chief Complaint  Patient presents with  . Establish Care    left hip pain that runs down leg to foot, fasting    HPI:  Rebecca Estes is a 82 y.o. female who  has a past medical history of Arrhythmia; Bleeding ulcer; Cancer ([place]); Hiatal hernia; Hypertension; Hyperthyroidism; and Osteoporosis. and presents today for an office visit to establish care.    1.) Hypertension - Currently prescribed metoprolol and amlodipine. Reports that she has been out of the amlodipine for about 1 week and takes the metoprolol on occasion. Metoprolol makes her sleepy on occasion. Denies worst headache of life or other symptoms of end organ damage.   BP Readings from Last 3 Encounters:  05/26/16 (!) 160/82  03/10/16 167/84  10/05/15 152/85     2.) Osteoporosis - Previously prescribed aldondranate and not currently taking it because she has been out of it. Takes it as prescribed and denies adverse side effects when she takes it. Does express deep achy pain located in her left hip. Believes that her last DEXA scan was about 4 years ago.   3.) Thyroid problems - Previously assessed for thyroid disorder and indicates that her thyroid function has been "off" however has not had any medication for it. Recalls a thyroid biopsy at one point of time. Denies temperature intolerance, fatigue, weight gain or changes to skin/hair/nails.    No Known Allergies    Outpatient Medications Prior to Visit  Medication Sig Dispense Refill  . alendronate (FOSAMAX) 10 MG tablet Take 10 mg by mouth daily before breakfast. Take with a full glass of water on an empty stomach.     Marland Kitchen amLODipine (NORVASC) 10 MG tablet Take 1 tablet (10 mg total) by mouth daily. 30 tablet 1  . cholecalciferol (VITAMIN D) 1000 UNITS tablet Take 1,000 Units by mouth daily. Every 2 weeks     . fluticasone (FLONASE) 50 MCG/ACT nasal spray Place 2  sprays into both nostrils daily. 16 g 0  . loratadine (CLARITIN) 10 MG tablet Take 1 tablet (10 mg total) by mouth daily. 30 tablet 0  . metoprolol (LOPRESSOR) 100 MG tablet Take 1 tablet (100 mg total) by mouth daily. 30 tablet 1  . cyclobenzaprine (FLEXERIL) 10 MG tablet Take 1 tablet (10 mg total) by mouth 2 (two) times daily as needed for muscle spasms. 20 tablet 0   No facility-administered medications prior to visit.      Past Medical History:  Diagnosis Date  . Arrhythmia   . Bleeding ulcer   . Cancer ([place])   . Hiatal hernia   . Hypertension   . Hyperthyroidism   . Osteoporosis       Past Surgical History:  Procedure Laterality Date  . ANTERIOR CERVICAL DECOMP/DISCECTOMY FUSION     x3  . NECK SURGERY    . ROTATOR CUFF REPAIR     Left  . UMBILICAL HERNIA REPAIR        Family History  Problem Relation Age of Onset  . Aneurysm Father 78    Brain  . Other Mother 31    Blood Infection  . Coronary artery disease Brother 38  . Stroke Brother 37  . Aneurysm Sister 46    Brain  . Cervical cancer Sister 59  . Kidney failure Sister 71    was on Dialysis  . Aneurysm Sister  Brain      Social History   Social History  . Marital status: Legally Separated    Spouse name: N/A  . Number of children: 5  . Years of education: 9   Occupational History  . Not on file.   Social History Main Topics  . Smoking status: Never Smoker  . Smokeless tobacco: Never Used  . Alcohol use No  . Drug use: No  . Sexual activity: Not on file   Other Topics Concern  . Not on file   Social History Narrative   Denies abuse and feels safe at home.       Review of Systems  Constitutional: Negative for chills and fever.  Eyes:       Negative for changes in vision  Respiratory: Negative for cough, chest tightness and wheezing.   Cardiovascular: Negative for chest pain, palpitations and leg swelling.  Neurological: Negative for dizziness, weakness and  light-headedness.       Objective:    BP (!) 160/82 (BP Location: Left Arm, Patient Position: Sitting, Cuff Size: Normal)   Pulse (!) 45   Temp 98.3 F (36.8 C) (Oral)   Resp 16   Ht 5\' 5"  (1.651 m)   Wt 125 lb 12.8 oz (57.1 kg)   SpO2 97%   BMI 20.93 kg/m  Nursing note and vital signs reviewed.  Physical Exam  Constitutional: She is oriented to person, place, and time. She appears well-developed and well-nourished. No distress.  Cardiovascular: Normal rate, regular rhythm, normal heart sounds and intact distal pulses.   Pulmonary/Chest: Effort normal and breath sounds normal.  Musculoskeletal:  Left hip - no obvious deformity, discoloration, or edema. No palpable tenderness able to be elicited. Muscle strength is 4+. Distal pulses and sensation are intact and appropriate. Negative Faber's and negative hip scouring.  Neurological: She is alert and oriented to person, place, and time.  Skin: Skin is warm and dry.  Psychiatric: She has a normal mood and affect. Her behavior is normal. Judgment and thought content normal.        Assessment & Plan:   Problem List Items Addressed This Visit      Cardiovascular and Mediastinum   Essential hypertension    Blood pressure remains above goal 150/90 with current regimen although has not been taking her amlodipine secondary to running out of it. Continue current dosage of amlodipine. Reduced dose of metoprolol to 50 mg given heart rate of 45. She is asymptomatic. Denies worse headache of her life or symptoms of end organ damage. Obtain complete metabolic profile to check kidney function. Encouraged to follow low-sodium diet. Continue to monitor.      Relevant Medications   aspirin EC 81 MG tablet   Other Relevant Orders   Comprehensive metabolic panel (Completed)     Endocrine   Hypothyroidism    Per patient previously diagnosed with thyroid issues. Obtain TSH to determine current status. No current symptoms. Continue to monitor  pending TSH results.      Relevant Orders   TSH (Completed)     Musculoskeletal and Integument   Osteoporosis    Previously diagnosed with osteoporosis and prescribed Fosamax which she has not taken in several weeks. Obtain bone mineral density test. Hold Fosamax pending bone density test and possible starting of Prolia. Continue vitamin D supplementation. Obtain vitamin D levels.      Relevant Orders   Vitamin D (25 hydroxy) (Completed)   DG Bone Density    Other Visit Diagnoses  Screening for heart disease    -  Primary   Relevant Orders   Lipid Profile (Completed)       I have discontinued Ms. Archie's cyclobenzaprine. I am also having her maintain her cholecalciferol, alendronate, amLODipine, metoprolol, loratadine, fluticasone, and aspirin EC.   Meds ordered this encounter  Medications  . aspirin EC 81 MG tablet    Sig: Take 81 mg by mouth daily.     Follow-up: Return in about 3 months (around 08/26/2016), or if symptoms worsen or fail to improve.  Mauricio Po, FNP

## 2016-05-30 ENCOUNTER — Telehealth: Payer: Self-pay | Admitting: Family

## 2016-05-30 MED ORDER — AMLODIPINE BESYLATE 10 MG PO TABS: 10.0000 mg | ORAL_TABLET | Freq: Every day | ORAL | 1 refills | Status: DC

## 2016-05-30 NOTE — Telephone Encounter (Signed)
Rx sent 

## 2016-05-30 NOTE — Telephone Encounter (Signed)
Pt called stating she is out of amLODipine (NORVASC) 10 MG tablet, please send this med into [facility] ASAP

## 2016-05-31 MED ORDER — AMLODIPINE BESYLATE 10 MG PO TABS: 10.0000 mg | ORAL_TABLET | Freq: Every day | ORAL | 1 refills | Status: DC

## 2016-05-31 NOTE — Addendum Note (Signed)
Addended by: Earnstine Regal on: 05/31/2016 11:52 AM   Modules accepted: Orders

## 2016-05-31 NOTE — Telephone Encounter (Signed)
Rec'd fax refill send to wrong pharmacy needing rx sent to [facility]. Resent to [facility]...Rebecca Estes

## 2016-06-01 ENCOUNTER — Other Ambulatory Visit: Payer: Self-pay

## 2016-06-01 DIAGNOSIS — E039 Hypothyroidism, unspecified: Secondary | ICD-10-CM

## 2016-06-11 ENCOUNTER — Emergency Department (HOSPITAL_COMMUNITY)
Admission: EM | Admit: 2016-06-11 | Discharge: 2016-06-11 | Disposition: A | Discharge status: Home / Self Care | Payer: PPO | Attending: Emergency Medicine | Admitting: Emergency Medicine

## 2016-06-11 ENCOUNTER — Encounter (HOSPITAL_COMMUNITY): Payer: Self-pay | Admitting: Emergency Medicine

## 2016-06-11 DIAGNOSIS — M62838 Other muscle spasm: Secondary | ICD-10-CM | POA: Insufficient documentation

## 2016-06-11 DIAGNOSIS — Z7982 Long term (current) use of aspirin: Secondary | ICD-10-CM | POA: Diagnosis not present

## 2016-06-11 DIAGNOSIS — M542 Cervicalgia: Secondary | ICD-10-CM | POA: Diagnosis not present

## 2016-06-11 DIAGNOSIS — E039 Hypothyroidism, unspecified: Secondary | ICD-10-CM | POA: Diagnosis not present

## 2016-06-11 DIAGNOSIS — I10 Essential (primary) hypertension: Secondary | ICD-10-CM | POA: Insufficient documentation

## 2016-06-11 MED ORDER — METHOCARBAMOL 500 MG PO TABS: 500.0000 mg | ORAL_TABLET | Freq: Three times a day (TID) | ORAL | 0 refills | Status: DC | PRN

## 2016-06-11 NOTE — ED Provider Notes (Signed)
[facility] DEPT Provider Note   CSN: [id number] Arrival date & time: 06/11/16  [id number]  History   Chief Complaint Chief Complaint  Patient presents with  . Neck Pain   HPI Rebecca Estes is a 82 y.o. female presenting today for neck pain.  HPI  Presenting with one week history of neck pain. Worsened on Wednesday. Reports she had a knot on the side of her neck on Wednesday, but this has resolved. She woke up this morning with worsening of pain to 10/10 in severity, but this has improved since she has been up and is now 3/10. Has been using [employer] with some improvement. She has also been using Aspirin as needed for the pain. Denies numbness or tingling in upper or lower extremities. Denies weakness. Denies chest pain, shortness of breath, or palpitations.   History of anterior cervical decompression/discectomy fusion eight to ten years ago.   History of Osteoporosis noted. DEXA on 05/26/16 with osteoporosis, moderate fracture risk. Vitamin D level normal at 50.43 on 05/26/16. Currently prescribed Alendronate, but does not take consistently.   Past Medical History:  Diagnosis Date  . Arrhythmia   . Bleeding ulcer   . Cancer ([place])   . Hiatal hernia   . Hypertension   . Hyperthyroidism   . Osteoporosis     Patient Active Problem List   Diagnosis Date Noted  . Essential hypertension 05/26/2016  . Osteoporosis 05/26/2016  . Hypothyroidism 05/26/2016    Past Surgical History:  Procedure Laterality Date  . ANTERIOR CERVICAL DECOMP/DISCECTOMY FUSION     x3  . NECK SURGERY    . ROTATOR CUFF REPAIR     Left  . TUBAL LIGATION    . UMBILICAL HERNIA REPAIR      OB History    No data available     Home Medications    Prior to Admission medications   Medication Sig Start Date End Date Taking? Authorizing Provider  alendronate (FOSAMAX) 10 MG tablet Take 10 mg by mouth daily before breakfast. Take with a full glass of water on an empty stomach.     Historical Provider,  MD  amLODipine (NORVASC) 10 MG tablet Take 1 tablet (10 mg total) by mouth daily. 05/31/16   Gold[place] Circle, FNP  aspirin EC 81 MG tablet Take 81 mg by mouth daily.    Historical Provider, MD  cholecalciferol (VITAMIN D) 1000 UNITS tablet Take 1,000 Units by mouth daily. Every 2 weeks     Historical Provider, MD  fluticasone (FLONASE) 50 MCG/ACT nasal spray Place 2 sprays into both nostrils daily. 03/10/16   Melony Overly, MD  loratadine (CLARITIN) 10 MG tablet Take 1 tablet (10 mg total) by mouth daily. 03/10/16   Melony Overly, MD  metoprolol (LOPRESSOR) 100 MG tablet Take 1 tablet (100 mg total) by mouth daily. 03/10/16   Melony Overly, MD   Family History Family History  Problem Relation Age of Onset  . Aneurysm Father 60    Brain  . Other Mother 17    Blood Infection  . Coronary artery disease Brother 17  . Stroke Brother 81  . Aneurysm Sister 49    Brain  . Cervical cancer Sister 73  . Kidney failure Sister 66    was on Dialysis  . Aneurysm Sister     Brain   Social History Social History  Substance Use Topics  . Smoking status: Never Smoker  . Smokeless tobacco: Never Used  . Alcohol use  No    Allergies   Review of patient's allergies indicates no known allergies.  Review of Systems Review of Systems  Respiratory: Negative for chest tightness and shortness of breath.   Cardiovascular: Negative for chest pain, palpitations and leg swelling.  Musculoskeletal: Positive for neck pain.  Skin: Negative for rash.  Neurological: Negative for dizziness, weakness, numbness and headaches.  Hematological: Negative for adenopathy.    Physical Exam Updated Vital Signs BP 154/91   Pulse 72   Temp 98.7 F (37.1 C) (Oral)   Resp 14   Ht 5\' 5"  (1.651 m)   Wt 56.7 kg   SpO2 100%   BMI 20.80 kg/m   Physical Exam  Constitutional: She appears well-developed and well-nourished. No distress.  HENT:  Head: Normocephalic and atraumatic.  Mouth/Throat: Oropharynx is clear and  moist.  Neck:  Tenderness over left trapezius, worse over insertion at base of skull. No midline tenderness. Negative Spurlings.  Cardiovascular: Normal rate and regular rhythm.   Murmur heard. Pulmonary/Chest: Effort normal. No respiratory distress. She has no wheezes.  Abdominal: Soft. She exhibits no distension. There is no tenderness.  Musculoskeletal:  Muscle strength 5/5 in upper and lower extremities.   Neurological: No cranial nerve deficit.  Sensation intact over upper and lower extremities.  Psychiatric: She has a normal mood and affect. Her behavior is normal.     EKG Interpretation None      Procedures Procedures (including critical care time)  Initial Impression / Assessment and Plan / ED Course  I have reviewed the triage vital signs and the nursing notes.  Pertinent labs & imaging results that were available during my care of the patient were reviewed by me and considered in my medical decision making (see chart for details).  Clinical Course  Pain very localized over trapezius and reproducible with palpation of left trapezius. Pain almost resolved at presentation to ED.  Final Clinical Impressions(s) / ED Diagnoses   Final diagnoses:  Trapezius muscle spasm   Stable for discharge. Recommend Tylenol and Salonpas patches over the counter for treatment of pain. May also use heat to help with muscle spasm. Follow up with primary physician. If no improvement consider muscle relaxer.   New Prescriptions New Prescriptions   No medications on file     [place], [place] 06/11/16 [id number]    Julianne Rice, MD 06/11/16 [phone]

## 2016-06-11 NOTE — ED Notes (Signed)
ED Provider at bedside. 

## 2016-06-11 NOTE — ED Triage Notes (Signed)
Pt arrives from home c/o L sided neck pain.  Pt reports hx of same with corrective fusion approx 8 years ago.  Pt reports "knot on sie of my neck" on Wednesday, pain improved with ice packs.  Pt denies CP, SOB, dizziness, N/V.  NAD noted at this time, resp e/u.

## 2016-06-11 NOTE — Discharge Instructions (Signed)
I suspect you have a mild cervical sprain with some underlying arthritis. This should continue to improve with time. You may use Tylenol or Salonpas Patches as needed for the pain. You may also try heat to help with the muscle spasm. Please follow up with your primary doctor. Please return if you develop numbness or tingling or if the pain pain signficantly worsens.

## 2016-06-13 ENCOUNTER — Inpatient Hospital Stay: Payer: PPO | Admitting: Family

## 2016-06-16 ENCOUNTER — Ambulatory Visit (INDEPENDENT_AMBULATORY_CARE_PROVIDER_SITE_OTHER): Payer: PPO | Admitting: Family

## 2016-06-16 ENCOUNTER — Encounter: Payer: Self-pay | Admitting: Family

## 2016-06-16 VITALS — BP 160/90 | HR 76 | Temp 98.3°F | Resp 16 | Ht 65.0 in | Wt 119.0 lb

## 2016-06-16 DIAGNOSIS — M810 Age-related osteoporosis without current pathological fracture: Secondary | ICD-10-CM

## 2016-06-16 DIAGNOSIS — M62838 Other muscle spasm: Secondary | ICD-10-CM | POA: Diagnosis not present

## 2016-06-16 DIAGNOSIS — K5900 Constipation, unspecified: Secondary | ICD-10-CM

## 2016-06-16 MED ORDER — ALENDRONATE SODIUM 70 MG PO TABS: 70.0000 mg | ORAL_TABLET | ORAL | 2 refills | Status: DC

## 2016-06-16 MED ORDER — METOPROLOL TARTRATE 100 MG PO TABS: 100.0000 mg | ORAL_TABLET | Freq: Every day | ORAL | 1 refills | Status: DC

## 2016-06-16 NOTE — Assessment & Plan Note (Signed)
Symptoms and exam remain consistent with trapezius muscle spasms of nontraumatic origin. Continue conservative treatment with over-the-counter medications as needed for symptom relief and supportive care. Discussed importance of ice/moist heat and home exercise therapy. Follow-up if symptoms worsen or do not improve.

## 2016-06-16 NOTE — Assessment & Plan Note (Signed)
Constipation most likely related to decreased physical activity and nutritional intake. No apparent abdominal pain or concern for obstruction. Continue over-the-counter medications as needed for symptom relief and supportive care. Follow-up if symptoms do not improve.

## 2016-06-16 NOTE — Patient Instructions (Addendum)
Thank you for choosing [facility].  SUMMARY AND INSTRUCTIONS:  Please continue to take Tylenol as needed for discomfort.   Moist heat and ice x 20 minutes every 2 hours as needed.  Stretches and exercises daily.  For constipation consider increasing fiber through Benefiber or Metamucil. May continue over the counter medications like Miralax, docusate sodium, milk of magnesia, or Ducolox products.   Medication:  Your prescription(s) have been submitted to your pharmacy or been printed and provided for you. Please take as directed and contact our office if you believe you are having problem(s) with the medication(s) or have any questions.  Follow up:  If your symptoms worsen or fail to improve, please contact our office for further instruction, or in case of emergency go directly to the emergency room at the closest medical facility.     Cervical Strain and Sprain With Rehab Cervical strain and sprain are injuries that commonly occur with "whiplash" injuries. Whiplash occurs when the neck is forcefully whipped backward or forward, such as during a motor vehicle accident or during contact sports. The muscles, ligaments, tendons, discs, and nerves of the neck are susceptible to injury when this occurs. RISK FACTORS Risk of having a whiplash injury increases if:  Osteoarthritis of the spine.  Situations that make head or neck accidents or trauma more likely.  High-risk sports (football, rugby, wrestling, hockey, auto racing, gymnastics, diving, contact karate, or boxing).  Poor strength and flexibility of the neck.  Previous neck injury.  Poor tackling technique.  Improperly fitted or padded equipment. SYMPTOMS   Pain or stiffness in the front or back of neck or both.  Symptoms may present immediately or up to 24 hours after injury.  Dizziness, headache, nausea, and vomiting.  Muscle spasm with soreness and stiffness in the neck.  Tenderness and swelling at the  injury site. PREVENTION  Learn and use proper technique (avoid tackling with the head, spearing, and head-butting; use proper falling techniques to avoid landing on the head).  Warm up and stretch properly before activity.  Maintain physical fitness:  Strength, flexibility, and endurance.  Cardiovascular fitness.  Wear properly fitted and padded protective equipment, such as padded soft collars, for participation in contact sports. PROGNOSIS  Recovery from cervical strain and sprain injuries is dependent on the extent of the injury. These injuries are usually curable in 1 week to 3 months with appropriate treatment.  RELATED COMPLICATIONS   Temporary numbness and weakness may occur if the nerve roots are damaged, and this may persist until the nerve has completely healed.  Chronic pain due to frequent recurrence of symptoms.  Prolonged healing, especially if activity is resumed too soon (before complete recovery). TREATMENT  Treatment initially involves the use of ice and medication to help reduce pain and inflammation. It is also important to perform strengthening and stretching exercises and modify activities that worsen symptoms so the injury does not get worse. These exercises may be performed at home or with a therapist. For patients who experience severe symptoms, a soft, padded collar may be recommended to be worn around the neck.  Improving your posture may help reduce symptoms. Posture improvement includes pulling your chin and abdomen in while sitting or standing. If you are sitting, sit in a firm chair with your buttocks against the back of the chair. While sleeping, try replacing your pillow with a small towel rolled to 2 inches in diameter, or use a cervical pillow or soft cervical collar. Poor sleeping positions delay healing.  For patients with nerve root damage, which causes numbness or weakness, the use of a cervical traction apparatus may be recommended. Surgery is rarely  necessary for these injuries. However, cervical strain and sprains that are present at birth (congenital) may require surgery. MEDICATION   If pain medication is necessary, nonsteroidal anti-inflammatory medications, such as aspirin and ibuprofen, or other minor pain relievers, such as acetaminophen, are often recommended.  Do not take pain medication for 7 days before surgery.  Prescription pain relievers may be given if deemed necessary by your caregiver. Use only as directed and only as much as you need. HEAT AND COLD:   Cold treatment (icing) relieves pain and reduces inflammation. Cold treatment should be applied for 10 to 15 minutes every 2 to 3 hours for inflammation and pain and immediately after any activity that aggravates your symptoms. Use ice packs or an ice massage.  Heat treatment may be used prior to performing the stretching and strengthening activities prescribed by your caregiver, physical therapist, or athletic trainer. Use a heat pack or a warm soak. SEEK MEDICAL CARE IF:   Symptoms get worse or do not improve in 2 weeks despite treatment.  New, unexplained symptoms develop (drugs used in treatment may produce side effects). EXERCISES RANGE OF MOTION (ROM) AND STRETCHING EXERCISES - Cervical Strain and Sprain These exercises may help you when beginning to rehabilitate your injury. In order to successfully resolve your symptoms, you must improve your posture. These exercises are designed to help reduce the forward-head and rounded-shoulder posture which contributes to this condition. Your symptoms may resolve with or without further involvement from your physician, physical therapist or athletic trainer. While completing these exercises, remember:   Restoring tissue flexibility helps normal motion to return to the joints. This allows healthier, less painful movement and activity.  An effective stretch should be held for at least 20 seconds, although you may need to begin  with shorter hold times for comfort.  A stretch should never be painful. You should only feel a gentle lengthening or release in the stretched tissue. STRETCH- Axial Extensors  Lie on your back on the floor. You may bend your knees for comfort. Place a rolled-up hand towel or dish towel, about 2 inches in diameter, under the part of your head that makes contact with the floor.  Gently tuck your chin, as if trying to make a "double chin," until you feel a gentle stretch at the base of your head.  Hold __________ seconds. Repeat __________ times. Complete this exercise __________ times per day.  STRETCH - Axial Extension   Stand or sit on a firm surface. Assume a good posture: chest up, shoulders drawn back, abdominal muscles slightly tense, knees unlocked (if standing) and feet hip width apart.  Slowly retract your chin so your head slides back and your chin slightly lowers. Continue to look straight ahead.  You should feel a gentle stretch in the back of your head. Be certain not to feel an aggressive stretch since this can cause headaches later.  Hold for __________ seconds. Repeat __________ times. Complete this exercise __________ times per day. STRETCH - Cervical Side Bend   Stand or sit on a firm surface. Assume a good posture: chest up, shoulders drawn back, abdominal muscles slightly tense, knees unlocked (if standing) and feet hip width apart.  Without letting your nose or shoulders move, slowly tip your right / left ear to your shoulder until your feel a gentle stretch in the muscles on  the opposite side of your neck.  Hold __________ seconds. Repeat __________ times. Complete this exercise __________ times per day. STRETCH - Cervical Rotators   Stand or sit on a firm surface. Assume a good posture: chest up, shoulders drawn back, abdominal muscles slightly tense, knees unlocked (if standing) and feet hip width apart.  Keeping your eyes level with the ground, slowly turn  your head until you feel a gentle stretch along the back and opposite side of your neck.  Hold __________ seconds. Repeat __________ times. Complete this exercise __________ times per day. RANGE OF MOTION - Neck Circles   Stand or sit on a firm surface. Assume a good posture: chest up, shoulders drawn back, abdominal muscles slightly tense, knees unlocked (if standing) and feet hip width apart.  Gently roll your head down and around from the back of one shoulder to the back of the other. The motion should never be forced or painful.  Repeat the motion 10-20 times, or until you feel the neck muscles relax and loosen. Repeat __________ times. Complete the exercise __________ times per day. STRENGTHENING EXERCISES - Cervical Strain and Sprain These exercises may help you when beginning to rehabilitate your injury. They may resolve your symptoms with or without further involvement from your physician, physical therapist, or athletic trainer. While completing these exercises, remember:   Muscles can gain both the endurance and the strength needed for everyday activities through controlled exercises.  Complete these exercises as instructed by your physician, physical therapist, or athletic trainer. Progress the resistance and repetitions only as guided.  You may experience muscle soreness or fatigue, but the pain or discomfort you are trying to eliminate should never worsen during these exercises. If this pain does worsen, stop and make certain you are following the directions exactly. If the pain is still present after adjustments, discontinue the exercise until you can discuss the trouble with your clinician. STRENGTH - Cervical Flexors, Isometric  Face a wall, standing about 6 inches away. Place a small pillow, a ball about 6-8 inches in diameter, or a folded towel between your forehead and the wall.  Slightly tuck your chin and gently push your forehead into the soft object. Push only with mild  to moderate intensity, building up tension gradually. Keep your jaw and forehead relaxed.  Hold 10 to 20 seconds. Keep your breathing relaxed.  Release the tension slowly. Relax your neck muscles completely before you start the next repetition. Repeat __________ times. Complete this exercise __________ times per day. STRENGTH- Cervical Lateral Flexors, Isometric   Stand about 6 inches away from a wall. Place a small pillow, a ball about 6-8 inches in diameter, or a folded towel between the side of your head and the wall.  Slightly tuck your chin and gently tilt your head into the soft object. Push only with mild to moderate intensity, building up tension gradually. Keep your jaw and forehead relaxed.  Hold 10 to 20 seconds. Keep your breathing relaxed.  Release the tension slowly. Relax your neck muscles completely before you start the next repetition. Repeat __________ times. Complete this exercise __________ times per day. STRENGTH - Cervical Extensors, Isometric   Stand about 6 inches away from a wall. Place a small pillow, a ball about 6-8 inches in diameter, or a folded towel between the back of your head and the wall.  Slightly tuck your chin and gently tilt your head back into the soft object. Push only with mild to moderate intensity, building up  tension gradually. Keep your jaw and forehead relaxed.  Hold 10 to 20 seconds. Keep your breathing relaxed.  Release the tension slowly. Relax your neck muscles completely before you start the next repetition. Repeat __________ times. Complete this exercise __________ times per day. POSTURE AND BODY MECHANICS CONSIDERATIONS - Cervical Strain and Sprain Keeping correct posture when sitting, standing or completing your activities will reduce the stress put on different body tissues, allowing injured tissues a chance to heal and limiting painful experiences. The following are general guidelines for improved posture. Your physician or physical  therapist will provide you with any instructions specific to your needs. While reading these guidelines, remember:  The exercises prescribed by your provider will help you have the flexibility and strength to maintain correct postures.  The correct posture provides the optimal environment for your joints to work. All of your joints have less wear and tear when properly supported by a spine with good posture. This means you will experience a healthier, less painful body.  Correct posture must be practiced with all of your activities, especially prolonged sitting and standing. Correct posture is as important when doing repetitive low-stress activities (typing) as it is when doing a single heavy-load activity (lifting). PROLONGED STANDING WHILE SLIGHTLY LEANING FORWARD When completing a task that requires you to lean forward while standing in one place for a long time, place either foot up on a stationary 2- to 4-inch high object to help maintain the best posture. When both feet are on the ground, the low back tends to lose its slight inward curve. If this curve flattens (or becomes too large), then the back and your other joints will experience too much stress, fatigue more quickly, and can cause pain.  RESTING POSITIONS Consider which positions are most painful for you when choosing a resting position. If you have pain with flexion-based activities (sitting, bending, stooping, squatting), choose a position that allows you to rest in a less flexed posture. You would want to avoid curling into a fetal position on your side. If your pain worsens with extension-based activities (prolonged standing, working overhead), avoid resting in an extended position such as sleeping on your stomach. Most people will find more comfort when they rest with their spine in a more neutral position, neither too rounded nor too arched. Lying on a non-sagging bed on your side with a pillow between your knees, or on your back with a  pillow under your knees will often provide some relief. Keep in mind, being in any one position for a prolonged period of time, no matter how correct your posture, can still lead to stiffness. WALKING Walk with an upright posture. Your ears, shoulders, and hips should all line up. OFFICE WORK When working at a desk, create an environment that supports good, upright posture. Without extra support, muscles fatigue and lead to excessive strain on joints and other tissues. CHAIR:  A chair should be able to slide under your desk when your back makes contact with the back of the chair. This allows you to work closely.  The chair's height should allow your eyes to be level with the upper part of your monitor and your hands to be slightly lower than your elbows.  Body position:  Your feet should make contact with the floor. If this is not possible, use a foot rest.  Keep your ears over your shoulders. This will reduce stress on your neck and low back.   This information is not intended to replace  advice given to you by your health care provider. Make sure you discuss any questions you have with your health care provider.   Document Released: 07/25/2005 Document Revised: 08/15/2014 Document Reviewed: 11/06/2008 Elsevier Interactive Patient Education [employer].

## 2016-06-16 NOTE — Progress Notes (Signed)
Subjective:    Patient ID: Rebecca Estes, female    DOB: September 01, 1933, 82 y.o.   MRN: [id number]  Chief Complaint  Patient presents with  . Hospitalization Follow-up    neck pain has improved a little since hospital visit, advise on the best medication to help regulate bowels    HPI:  Rebecca Estes is a 82 y.o. female who  has a past medical history of Arrhythmia; Bleeding ulcer; Cancer ([place]); Hiatal hernia; Hypertension; Hyperthyroidism; and Osteoporosis. and presents today for a hospitalization follow up.   Recently evaluated in the emergency department for worsening chronic neck pain with a severity of 10/10 but gradually improved prior to assessment. Modifying factors at the time included ice packs and aspirin which helped decrease the pain. Physical exam with tenderness over the left upper trapezius with negative Spurling's and good upper extremity strength and sensation. She was diagnosed with upper trapezius muscle spasms with the advice of Tylenol and Salonpas as needed.  Reports that the neck pain has improved since leaving the ED. She was prescribed Robaxin which did not help very much and states that it made her feel different. Describes as an old tricky type pain that generally comes and goes. There is occasional radiculopathy. Expresses now that she has been having decreased bowel movements a frequency about every 2-3 days. She reports not have eating well because of dental issues that have improved.  Allergies  Allergen Reactions  . Methocarbamol     Causes joint pain      Outpatient Medications Prior to Visit  Medication Sig Dispense Refill  . amLODipine (NORVASC) 10 MG tablet Take 1 tablet (10 mg total) by mouth daily. 90 tablet 1  . aspirin EC 81 MG tablet Take 81 mg by mouth daily.    . cholecalciferol (VITAMIN D) 1000 UNITS tablet Take 1,000 Units by mouth daily. Every 2 weeks     . fluticasone (FLONASE) 50 MCG/ACT nasal spray Place 2 sprays into both nostrils  daily. 16 g 0  . loratadine (CLARITIN) 10 MG tablet Take 1 tablet (10 mg total) by mouth daily. 30 tablet 0  . alendronate (FOSAMAX) 10 MG tablet Take 10 mg by mouth daily before breakfast. Take with a full glass of water on an empty stomach.     . methocarbamol (ROBAXIN) 500 MG tablet Take 1 tablet (500 mg total) by mouth every 8 (eight) hours as needed for muscle spasms. 20 tablet 0  . metoprolol (LOPRESSOR) 100 MG tablet Take 1 tablet (100 mg total) by mouth daily. 30 tablet 1   No facility-administered medications prior to visit.       Past Surgical History:  Procedure Laterality Date  . ANTERIOR CERVICAL DECOMP/DISCECTOMY FUSION     x3  . NECK SURGERY    . ROTATOR CUFF REPAIR     Left  . TUBAL LIGATION    . UMBILICAL HERNIA REPAIR        Past Medical History:  Diagnosis Date  . Arrhythmia   . Bleeding ulcer   . Cancer ([place])   . Hiatal hernia   . Hypertension   . Hyperthyroidism   . Osteoporosis       Review of Systems  Constitutional: Negative for chills and fever.  Gastrointestinal: Positive for constipation.  Musculoskeletal: Positive for neck pain and neck stiffness.  Neurological: Negative for weakness and numbness.      Objective:    BP (!) 160/90 (BP Location: Left Arm, Patient Position: Sitting, Cuff  Size: Normal)   Pulse 76   Temp 98.3 F (36.8 C) (Oral)   Resp 16   Ht 5\' 5"  (1.651 m)   Wt 119 lb (54 kg)   SpO2 98%   BMI 19.80 kg/m  Nursing note and vital signs reviewed.  Physical Exam  Constitutional: She is oriented to person, place, and time. She appears well-nourished. She appears cachectic. No distress.  Neck:  No obvious deformity, discoloration, or edema. There is mild tenderness along cervical spine and upper trapezius. Range of motion appears normal with some restriction in lateral bending. Negative cervical compression and Spurling's. Distal pulses and sensation are intact and appropriate.  Cardiovascular: Normal rate, regular  rhythm, normal heart sounds and intact distal pulses.   Pulmonary/Chest: Effort normal and breath sounds normal.  Neurological: She is alert and oriented to person, place, and time.  Skin: Skin is warm and dry.  Psychiatric: She has a normal mood and affect. Her behavior is normal. Judgment and thought content normal.       Assessment & Plan:   Problem List Items Addressed This Visit      Digestive   Constipation    Constipation most likely related to decreased physical activity and nutritional intake. No apparent abdominal pain or concern for obstruction. Continue over-the-counter medications as needed for symptom relief and supportive care. Follow-up if symptoms do not improve.        Musculoskeletal and Integument   Osteoporosis - Primary   Relevant Medications   alendronate (FOSAMAX) 70 MG tablet   Trapezius muscle spasm    Symptoms and exam remain consistent with trapezius muscle spasms of nontraumatic origin. Continue conservative treatment with over-the-counter medications as needed for symptom relief and supportive care. Discussed importance of ice/moist heat and home exercise therapy. Follow-up if symptoms worsen or do not improve.          I have discontinued Ms. Archie's alendronate and methocarbamol. I am also having her start on alendronate. Additionally, I am having her maintain her cholecalciferol, loratadine, fluticasone, aspirin EC, amLODipine, and metoprolol.   Meds ordered this encounter  Medications  . metoprolol (LOPRESSOR) 100 MG tablet    Sig: Take 1 tablet (100 mg total) by mouth daily.    Dispense:  30 tablet    Refill:  1  . alendronate (FOSAMAX) 70 MG tablet    Sig: Take 1 tablet (70 mg total) by mouth once a week. Take with a full glass of water on an empty stomach.    Dispense:  4 tablet    Refill:  2    Order Specific Question:   Supervising Provider    Answer:   Pricilla Holm A [id number]     Follow-up: Return if symptoms worsen or fail  to improve.  Mauricio Po, FNP

## 2016-08-28 ENCOUNTER — Ambulatory Visit (HOSPITAL_COMMUNITY)
Admission: EM | Admit: 2016-08-28 | Discharge: 2016-08-28 | Disposition: A | Discharge status: Home / Self Care | Payer: PPO | Attending: Emergency Medicine | Admitting: Emergency Medicine

## 2016-08-28 ENCOUNTER — Encounter (HOSPITAL_COMMUNITY): Payer: Self-pay | Admitting: *Deleted

## 2016-08-28 DIAGNOSIS — H6983 Other specified disorders of Eustachian tube, bilateral: Secondary | ICD-10-CM | POA: Diagnosis not present

## 2016-08-28 DIAGNOSIS — R059 Cough, unspecified: Secondary | ICD-10-CM

## 2016-08-28 DIAGNOSIS — R0982 Postnasal drip: Secondary | ICD-10-CM

## 2016-08-28 DIAGNOSIS — R05 Cough: Secondary | ICD-10-CM | POA: Diagnosis not present

## 2016-08-28 MED ORDER — CETIRIZINE HCL 10 MG PO TABS: 10.0000 mg | ORAL_TABLET | Freq: Every day | ORAL | 0 refills | Status: DC

## 2016-08-28 NOTE — ED Triage Notes (Signed)
Pt  Reports  l  Side  Chest   Hurting  X  1   Week    Pt  Reports     Pt  Reports  Pain is   Worse  On  Movement        Pt  Reports    Coughing  All  Last  Week          Yellowish   Mucous          X   Day

## 2016-08-28 NOTE — Discharge Instructions (Signed)
You have mild symptoms of a cold. This is causing some clogging of your ears. They should go away issue take your medicine and over the next few days. Take the Zyrtec 10 mg a day to help with drainage. He should drink plenty of fluids and stay well-hydrated. Follow-up with your doctor as needed next week.

## 2016-08-28 NOTE — ED Provider Notes (Signed)
CSN: [id number]     Arrival date & time 08/28/16  1201 History   First MD Initiated Contact with Patient 08/28/16 1241     No chief complaint on file.  (Consider location/radiation/quality/duration/timing/severity/associated sxs/prior Treatment) 83 year old female complaining of a cough that comes and goes and she also feels like her ears are occasionally stopped up. Denies shortness of breath or chest pain. Denies fever or chills. Symptoms started just a few days ago.      Past Medical History:  Diagnosis Date  . Arrhythmia   . Bleeding ulcer   . Cancer ([place])   . Hiatal hernia   . Hypertension   . Hyperthyroidism   . Osteoporosis    Past Surgical History:  Procedure Laterality Date  . ANTERIOR CERVICAL DECOMP/DISCECTOMY FUSION     x3  . NECK SURGERY    . ROTATOR CUFF REPAIR     Left  . TUBAL LIGATION    . UMBILICAL HERNIA REPAIR     Family History  Problem Relation Age of Onset  . Aneurysm Father 17    Brain  . Other Mother 41    Blood Infection  . Coronary artery disease Brother 72  . Stroke Brother 74  . Aneurysm Sister 47    Brain  . Cervical cancer Sister 51  . Kidney failure Sister 62    was on Dialysis  . Aneurysm Sister     Brain   Social History  Substance Use Topics  . Smoking status: Never Smoker  . Smokeless tobacco: Never Used  . Alcohol use No   OB History    No data available     Review of Systems  Constitutional: Positive for activity change. Negative for chills and fever.  HENT: Positive for postnasal drip. Negative for ear discharge, sore throat and trouble swallowing.   Respiratory: Positive for cough and shortness of breath.   Cardiovascular: Negative for chest pain.  Skin: Negative.   Neurological: Negative.   All other systems reviewed and are negative.   Allergies  Methocarbamol  Home Medications   Prior to Admission medications   Medication Sig Start Date End Date Taking? Authorizing Provider  alendronate (FOSAMAX) 70  MG tablet Take 1 tablet (70 mg total) by mouth once a week. Take with a full glass of water on an empty stomach. 06/16/16   Golden Circle, FNP  amLODipine (NORVASC) 10 MG tablet Take 1 tablet (10 mg total) by mouth daily. 05/31/16   Golden Circle, FNP  aspirin EC 81 MG tablet Take 81 mg by mouth daily.    Historical Provider, MD  cetirizine (ZYRTEC) 10 MG tablet Take 1 tablet (10 mg total) by mouth daily. For drainage 08/28/16   Janne Napoleon, NP  cholecalciferol (VITAMIN D) 1000 UNITS tablet Take 1,000 Units by mouth daily. Every 2 weeks     Historical Provider, MD  fluticasone (FLONASE) 50 MCG/ACT nasal spray Place 2 sprays into both nostrils daily. 03/10/16   Melony Overly, MD  loratadine (CLARITIN) 10 MG tablet Take 1 tablet (10 mg total) by mouth daily. 03/10/16   Melony Overly, MD  metoprolol (LOPRESSOR) 100 MG tablet Take 1 tablet (100 mg total) by mouth daily. 06/16/16   Golden Circle, FNP   Meds Ordered and Administered this Visit  Medications - No data to display  BP 154/92 (BP Location: Right Arm)   Pulse 74   Temp 98.7 F (37.1 C) (Oral)   Resp 16   SpO2 99%  No data found.   Physical Exam  Constitutional: She is oriented to person, place, and time. She appears well-developed and well-nourished. No distress.  HENT:  Head: Normocephalic and atraumatic.  Right Ear: External ear normal.  Left Ear: External ear normal.  Bilateral TMs with mild retraction. No erythema.  Oropharynx with faint posterior pharynx erythema. No swelling or exudate.  Eyes: EOM are normal.  Neck: Normal range of motion. Neck supple.  Cardiovascular: Normal rate and regular rhythm.   Murmur heard. Pulmonary/Chest: Effort normal and breath sounds normal. No respiratory distress. She has no rales.  Musculoskeletal: She exhibits no edema.  Lymphadenopathy:    She has no cervical adenopathy.  Neurological: She is alert and oriented to person, place, and time.  Skin: Skin is warm and dry.  Psychiatric:  She has a normal mood and affect.  Nursing note and vitals reviewed.   Urgent Care Course     Procedures (including critical care time)  Labs Review Labs Reviewed - No data to display  Imaging Review No results found.   Visual Acuity Review  Right Eye Distance:   Left Eye Distance:   Bilateral Distance:    Right Eye Near:   Left Eye Near:    Bilateral Near:         MDM   1. PND (post-nasal drip)   2. Cough   3. ETD (Eustachian tube dysfunction), bilateral    You have mild symptoms of a cold. This is causing some clogging of your ears. They should go away issue take your medicine and over the next few days. Take the Zyrtec 10 mg a day to help with drainage. He should drink plenty of fluids and stay well-hydrated. Follow-up with your doctor as needed next week. Meds ordered this encounter  Medications  . cetirizine (ZYRTEC) 10 MG tablet    Sig: Take 1 tablet (10 mg total) by mouth daily. For drainage    Dispense:  30 tablet    Refill:  0    Order Specific Question:   Supervising Provider    Answer:   Carmela Hurt        Janne Napoleon, NP 08/28/16 1257

## 2016-10-03 ENCOUNTER — Other Ambulatory Visit: Payer: Self-pay | Admitting: Family

## 2016-10-03 DIAGNOSIS — Z1231 Encounter for screening mammogram for malignant neoplasm of breast: Secondary | ICD-10-CM

## 2016-10-19 ENCOUNTER — Ambulatory Visit
Admission: RE | Admit: 2016-10-19 | Discharge: 2016-10-19 | Disposition: A | Discharge status: Home / Self Care | Payer: PPO | Source: Ambulatory Visit | Attending: Family | Admitting: Family

## 2016-10-19 DIAGNOSIS — Z1231 Encounter for screening mammogram for malignant neoplasm of breast: Secondary | ICD-10-CM | POA: Diagnosis not present

## 2016-10-28 ENCOUNTER — Telehealth: Payer: Self-pay

## 2016-10-28 NOTE — Telephone Encounter (Signed)
I will be submiting insurance info to get summary of benefits for prolia injections and will call patient back with those findings, can talk with Marely Apgar if any questions

## 2016-10-28 NOTE — Telephone Encounter (Signed)
Patient was previously considered for prolia/osteoporosis---not sure why she never came or got first injection----are you ok with me reaching out to patient to see if she would like to start injections---please advise, thanks

## 2016-10-28 NOTE — Telephone Encounter (Signed)
That is fine 

## 2016-11-03 ENCOUNTER — Telehealth: Payer: Self-pay

## 2016-11-03 NOTE — Telephone Encounter (Signed)
Rebecca Estes has submitted prolia information into patient's insurance---Rebecca Estes will call patient back after I get copay/coverage results 

## 2016-11-15 ENCOUNTER — Telehealth: Payer: Self-pay

## 2016-11-15 NOTE — Telephone Encounter (Signed)
Left message advising patient to call back to discuss summary of benefits for prolia injections----insurance has been verified and patient will owe estimated $250 copay----can talk with tamara if any further questions

## 2016-12-30 DIAGNOSIS — I10 Essential (primary) hypertension: Secondary | ICD-10-CM | POA: Diagnosis not present

## 2016-12-30 DIAGNOSIS — M810 Age-related osteoporosis without current pathological fracture: Secondary | ICD-10-CM | POA: Diagnosis not present

## 2016-12-30 DIAGNOSIS — K5900 Constipation, unspecified: Secondary | ICD-10-CM | POA: Diagnosis not present

## 2017-01-04 DIAGNOSIS — H40013 Open angle with borderline findings, low risk, bilateral: Secondary | ICD-10-CM | POA: Diagnosis not present

## 2017-01-04 DIAGNOSIS — H26491 Other secondary cataract, right eye: Secondary | ICD-10-CM | POA: Diagnosis not present

## 2017-01-04 DIAGNOSIS — Z961 Presence of intraocular lens: Secondary | ICD-10-CM | POA: Diagnosis not present

## 2017-01-04 NOTE — Telephone Encounter (Signed)
Tried to call patieint again to talk about prolia---mailbox is full, no way to leave message---I have called daughter, Rebecca Estes, and left message on her cellphone to have her mother call in about prolia---can talk with Rebecca Estes if patient or daughter calls back

## 2017-02-20 ENCOUNTER — Other Ambulatory Visit: Payer: Self-pay | Admitting: Family

## 2017-04-13 DIAGNOSIS — I10 Essential (primary) hypertension: Secondary | ICD-10-CM | POA: Diagnosis not present

## 2017-04-13 DIAGNOSIS — R634 Abnormal weight loss: Secondary | ICD-10-CM | POA: Diagnosis not present

## 2017-04-14 DIAGNOSIS — E039 Hypothyroidism, unspecified: Secondary | ICD-10-CM | POA: Diagnosis not present

## 2017-07-01 ENCOUNTER — Other Ambulatory Visit: Payer: Self-pay

## 2017-07-01 ENCOUNTER — Emergency Department (HOSPITAL_COMMUNITY): Payer: Medicare HMO

## 2017-07-01 ENCOUNTER — Emergency Department (HOSPITAL_COMMUNITY)
Admission: EM | Admit: 2017-07-01 | Discharge: 2017-07-01 | Disposition: A | Discharge status: Home / Self Care | Payer: Medicare HMO | Attending: Emergency Medicine | Admitting: Emergency Medicine

## 2017-07-01 ENCOUNTER — Encounter (HOSPITAL_COMMUNITY): Payer: Self-pay

## 2017-07-01 DIAGNOSIS — K5900 Constipation, unspecified: Secondary | ICD-10-CM

## 2017-07-01 DIAGNOSIS — E039 Hypothyroidism, unspecified: Secondary | ICD-10-CM | POA: Insufficient documentation

## 2017-07-01 DIAGNOSIS — K625 Hemorrhage of anus and rectum: Secondary | ICD-10-CM | POA: Diagnosis not present

## 2017-07-01 DIAGNOSIS — Z859 Personal history of malignant neoplasm, unspecified: Secondary | ICD-10-CM | POA: Diagnosis not present

## 2017-07-01 DIAGNOSIS — Z79899 Other long term (current) drug therapy: Secondary | ICD-10-CM | POA: Insufficient documentation

## 2017-07-01 DIAGNOSIS — I10 Essential (primary) hypertension: Secondary | ICD-10-CM | POA: Diagnosis not present

## 2017-07-01 LAB — COMPREHENSIVE METABOLIC PANEL
ALBUMIN: 3.8 g/dL (ref 3.5–5.0)
ALT: 23 U/L (ref 14–54)
AST: 21 U/L (ref 15–41)
Alkaline Phosphatase: 53 U/L (ref 38–126)
Anion gap: 8 (ref 5–15)
BUN: 17 mg/dL (ref 6–20)
CHLORIDE: 105 mmol/L (ref 101–111)
CO2: 26 mmol/L (ref 22–32)
CREATININE: 0.75 mg/dL (ref 0.44–1.00)
Calcium: 9.7 mg/dL (ref 8.9–10.3)
GFR calc Af Amer: >60 mL/min (ref >60)
GFR calc non Af Amer: >60 mL/min (ref >60)
GLUCOSE: 103 mg/dL — AB (ref 65–99)
POTASSIUM: 3.7 mmol/L (ref 3.5–5.1)
Sodium: 139 mmol/L (ref 135–145)
Total Bilirubin: 0.3 mg/dL (ref 0.3–1.2)
Total Protein: 6.6 g/dL (ref 6.5–8.1)

## 2017-07-01 LAB — CBC WITH DIFFERENTIAL/PLATELET
BASOS ABS: 0 10*3/uL (ref 0.0–0.1)
BASOS PCT: 0 %
EOS PCT: 1 %
Eosinophils Absolute: 0 10*3/uL (ref 0.0–0.7)
HCT: 37.2 % (ref 36.0–46.0)
Hemoglobin: 12.4 g/dL (ref 12.0–15.0)
LYMPHS PCT: 35 %
Lymphs Abs: 1.7 10*3/uL (ref 0.7–4.0)
MCH: 30.8 pg (ref 26.0–34.0)
MCHC: 33.3 g/dL (ref 30.0–36.0)
MCV: 92.5 fL (ref 78.0–100.0)
Monocytes Absolute: 0.4 10*3/uL (ref 0.1–1.0)
Monocytes Relative: 8 %
Neutro Abs: 2.8 10*3/uL (ref 1.7–7.7)
Neutrophils Relative %: 56 %
PLATELETS: 198 10*3/uL (ref 150–400)
RBC: 4.02 MIL/uL (ref 3.87–5.11)
RDW: 13.7 % (ref 11.5–15.5)
WBC: 5 10*3/uL (ref 4.0–10.5)

## 2017-07-01 LAB — POC OCCULT BLOOD, ED: Fecal Occult Bld: POSITIVE — AB

## 2017-07-01 LAB — MAGNESIUM: Magnesium: 1.9 mg/dL (ref 1.7–2.4)

## 2017-07-01 MED ORDER — DOCUSATE SODIUM 100 MG PO CAPS: 100.0000 mg | ORAL_CAPSULE | Freq: Two times a day (BID) | ORAL | 0 refills | Status: DC

## 2017-07-01 MED ORDER — POLYETHYLENE GLYCOL 3350 17 G PO PACK: 17.0000 g | PACK | Freq: Every day | ORAL | 0 refills | Status: DC

## 2017-07-01 MED ORDER — MAGNESIUM CITRATE PO SOLN: 1.0000 | Freq: Every day | ORAL | 5 refills | Status: DC | PRN

## 2017-07-01 MED ORDER — IOPAMIDOL (ISOVUE-300) INJECTION 61%
INTRAVENOUS | Status: AC
  Administered 2017-07-01: 100 mL
  Filled 2017-07-01: qty 100

## 2017-07-01 NOTE — ED Triage Notes (Signed)
Patient complains of constipation x 3 months, has been taking ex-lax for same and this am had rectal bleeding with a bowel movement and here for further evaluation

## 2017-07-01 NOTE — ED Provider Notes (Signed)
Emergency Department Provider Note   I have reviewed the triage vital signs and the nursing notes.   HISTORY  Chief Complaint Constipation   HPI Rebecca Estes is a 83 y.o. female with a history of cancer, hypertension, hypothyroidism, osteoporosis who presents to the emergency department today with constipation.  Patient states that over the last 6-9 months she has had this 25-30 pound weight loss.  She associates this to not having her correct dentures.  She says she has not been eating as much or drinking as much during this time.  She never had a colonoscopy.  Over the last couple months she has had intermittent worsening constipation.  She states that sugar 2 or 3 days without proven sometimes and she will take some small orange pill which will help her have a bowel movement.  She is tried this for the last couple of days and it did not work and she felt something hard at the edge of her rectum when she was straining to push out which worried her and brought her to the emergency department.  She has no other associated symptoms.  No other modifying symptoms.   Past Medical History:  Diagnosis Date  . Arrhythmia   . Bleeding ulcer   . Cancer ([place])   . Hiatal hernia   . Hypertension   . Hyperthyroidism   . Osteoporosis     Patient Active Problem List   Diagnosis Date Noted  . Trapezius muscle spasm 06/16/2016  . Constipation 06/16/2016  . Essential hypertension 05/26/2016  . Osteoporosis 05/26/2016  . Hypothyroidism 05/26/2016    Past Surgical History:  Procedure Laterality Date  . ANTERIOR CERVICAL DECOMP/DISCECTOMY FUSION     x3  . BREAST BIOPSY Left 02/10/2009  . NECK SURGERY    . ROTATOR CUFF REPAIR     Left  . TUBAL LIGATION    . UMBILICAL HERNIA REPAIR      Current Outpatient Rx  . Order #: [id number] Class: Normal  . Order #: [id number] Class: Normal  . Order #: [id number] Class: Historical Med  . Order #: [id number] Class: Historical Med  . Order #:  [id number] Class: Normal  . Order #: [id number] Class: Normal  . Order #: [id number] Class: Normal  . Order #: [id number] Class: Print  . Order #: [id number] Class: Normal  . Order #: [id number] Class: Print  . Order #: [id number] Class: Print    Allergies Methocarbamol  Family History  Problem Relation Age of Onset  . Aneurysm Father 90       Brain  . Other Mother 20       Blood Infection  . Coronary artery disease Brother 65  . Stroke Brother 12  . Aneurysm Sister 65       Brain  . Cervical cancer Sister 71  . Kidney failure Sister 55       was on Dialysis  . Aneurysm Sister        Brain    Social History Social History   Tobacco Use  . Smoking status: Never Smoker  . Smokeless tobacco: Never Used  Substance Use Topics  . Alcohol use: No  . Drug use: No    Review of Systems  All other systems negative except as documented in the HPI. All pertinent positives and negatives as reviewed in the HPI. ____________________________________________   PHYSICAL EXAM:  VITAL SIGNS: ED Triage Vitals [07/01/17 0921]  Enc Vitals Group     BP (!) 141/79     Pulse Rate 64     Resp  17     Temp 97.6 F (36.4 C)     Temp Source Oral     SpO2 100 %    Constitutional: Alert and oriented. Well appearing and in no acute distress. Eyes: Conjunctivae are normal. PERRL. EOMI. Head: Atraumatic. Nose: No congestion/rhinnorhea. Mouth/Throat: Mucous membranes are moist.  Oropharynx non-erythematous. Neck: No stridor.  No meningeal signs.   Cardiovascular: Normal rate, regular rhythm. Good peripheral circulation. Grossly normal heart sounds.   Respiratory: Normal respiratory effort.  No retractions. Lungs CTAB. Gastrointestinal: Soft and nontender. No distention.  Musculoskeletal: No lower extremity tenderness nor edema. No gross deformities of extremities. Neurologic:  Normal speech and language. No gross focal neurologic deficits are appreciated.  Skin:  Skin is warm, dry and intact. No rash  noted. GU: chaperoned by nursing tech, two small hemmorhoids, no obvious fissure. Did have streaks of blood on finger with DRE however.   ____________________________________________   LABS (all labs ordered are listed, but only abnormal results are displayed)  Labs Reviewed  COMPREHENSIVE METABOLIC PANEL - Abnormal; Notable for the following components:      Result Value   Glucose, Bld 103 (*)    All other components within normal limits  POC OCCULT BLOOD, ED - Abnormal; Notable for the following components:   Fecal Occult Bld POSITIVE (*)    All other components within normal limits  CBC WITH DIFFERENTIAL/PLATELET  MAGNESIUM   ____________________________________________  RADIOLOGY  Ct Abdomen Pelvis W Contrast  Result Date: 07/01/2017 CLINICAL DATA:  83 year old female with diffuse abdominal pain and intermittent constipation for a few weeks. Episode of bloody stool this morning. EXAM: CT ABDOMEN AND PELVIS WITH CONTRAST TECHNIQUE: Multidetector CT imaging of the abdomen and pelvis was performed using the standard protocol following bolus administration of intravenous contrast. CONTRAST:  157mL ISOVUE-300 IOPAMIDOL (ISOVUE-300) INJECTION 61% COMPARISON:  08/14/2007 FINDINGS: Lower chest: A 5 mm ground-glass density is identified in the right lung base. The lung bases are otherwise clear. Visualized portions of the mediastinum is grossly unremarkable. Hepatobiliary: Scattered hypodensities throughout the liver are similar to prior examination. The larger ones demonstrate fluid attenuation while some of the smaller ones are too small to definitively characterize though likely represent cysts. No other focal liver abnormality is seen. No gallstones or gallbladder wall thickening. There is mild prominence of the common bile duct up to 8.5 mm. Pancreas: Unremarkable. No pancreatic ductal dilatation or surrounding inflammatory changes. Spleen: Normal in size without focal abnormality.  Adrenals/Urinary Tract: Adrenal glands are unremarkable. A 1.4 cm hypodensity at the right intra pole demonstrates fluid attenuation and is consistent with a cyst. Kidneys are otherwise normal, without renal calculi, focal lesion, or hydronephrosis. Bladder is unremarkable. Stomach/Bowel: Stomach is within normal limits. Appendix appears normal. No evidence of bowel wall thickening, distention, or inflammatory changes. Note is made of a moderate stool burden, particularly within the rectum. Vascular/Lymphatic: No significant vascular findings are present. No enlarged abdominal or pelvic lymph nodes. Reproductive: Uterus and bilateral adnexa are unremarkable. Other: Note is again made of a fat containing supraumbilical hernia. No abdominopelvic ascites. Musculoskeletal: No acute or significant osseous findings. There are advanced multilevel discogenic degenerative changes with lumbar scoliosis. IMPRESSION: 1. Moderate stool burden, particularly within the rectum. No other acute intra-abdominal findings. 2. Mild dilatation of the common bile duct up to 8.5 mm. Recommendation is for correlation with LFTs. If LFTs are normal, findings likely represents chronic age related changes. If LFTs are abnormal, further evaluation with MRCP/ERCP recommended. This recommendation follows  ACR consensus guidelines: White Paper of the ACR Incidental Findings Committee II on Gallbladder and Biliary Findings. J Am Coll Radiol 2013:;10:953-956. 3. 5 mm ground-glass nodule at the right lung base. No follow-up recommended. This recommendation follows the consensus statement: Guidelines for Management of Incidental Pulmonary Nodules Detected on CT Images: From the Fleischner Society 2017; Radiology 2017; 284:228-243. 4. Scattered renal and hepatic hypodensities, some of which demonstrate fluid attenuation, and others of which are too small to definitively characterize. Findings are grossly similar to prior examination and most consistent  with simple cysts. 5. Other stable and chronic findings as detailed. Electronically Signed   By: Kristopher Oppenheim M.D.   On: 07/01/2017 11:31    ____________________________________________   PROCEDURES  Procedure(s) performed:   Procedures   ____________________________________________   INITIAL IMPRESSION / ASSESSMENT AND PLAN / ED COURSE  Pertinent labs & imaging results that were available during my care of the patient were reviewed by me and considered in my medical decision making (see chart for details).  With her recent weight loss, increasing constipation concern for possible malignancy especially since she is never had a colonoscopy that she knows of.  I believe the blood today is likely distal even though there is no fissure seen story supports it but will check a CT scan to make sure no evidence of malignancy. Labs for constipation.   Workup unremarkable.  Will start Colace twice a day and also some other instructions given to help with her persistent constipation.  She will need to follow-up with her primary doctor for further evaluation of the same.  She also needs to get new dentures to help with the weight loss.  No emergent conditions are suspected at this time as a cause for her symptoms.   ____________________________________________  FINAL CLINICAL IMPRESSION(S) / ED DIAGNOSES  Final diagnoses:  Constipation, unspecified constipation type  Rectal bleeding     MEDICATIONS GIVEN DURING THIS VISIT:  Medications  iopamidol (ISOVUE-300) 61 % injection (100 mLs  Contrast Given 07/01/17 1052)     NEW OUTPATIENT MEDICATIONS STARTED DURING THIS VISIT:  This SmartLink is deprecated. Use AVSMEDLIST instead to display the medication list for a patient.  Note:  This note was prepared with assistance of Dragon voice recognition software. Occasional wrong-word or sound-a-like substitutions may have occurred due to the inherent limitations of voice recognition  software.   Merrily Pew, MD 07/01/17 1236

## 2017-07-01 NOTE — Discharge Instructions (Signed)

## 2017-07-05 ENCOUNTER — Telehealth: Payer: Self-pay | Admitting: General Practice

## 2017-07-05 NOTE — Telephone Encounter (Signed)
I am not currently accepting new patients.  °

## 2017-07-05 NOTE — Telephone Encounter (Signed)
Copied from [place] [id number]. Topic: Appointment Scheduling - Prior Auth Required for Appointment >> Jul 05, 2017  3:40 PM Robina Ade, Helene Kelp D wrote: No appointment has been scheduled. Patient is requesting Dr. Billey Gosling appointment. Per scheduling protocol, this appointment requires a prior authorization prior to scheduling. Patient wants to see provider. She also needs a hospital f/u soon. Please call patient back, thanks. Route to department's PEC pool.  Patient use to see Terri Piedra. The last time she was seen was in Oct 2017. Do you accept transfer?

## 2017-07-10 NOTE — Telephone Encounter (Signed)
I have tried to call patient back to inform patient that Dr.Burns is not accepting patients. None the phone numbers are working. If patient calls back please have her set up an appointment with another provider.

## 2017-09-18 ENCOUNTER — Ambulatory Visit (INDEPENDENT_AMBULATORY_CARE_PROVIDER_SITE_OTHER): Payer: Medicare HMO | Admitting: Internal Medicine

## 2017-09-18 ENCOUNTER — Encounter: Payer: Self-pay | Admitting: Internal Medicine

## 2017-09-18 VITALS — BP 186/81 | HR 50 | Temp 97.8°F | Ht 65.0 in | Wt 111.6 lb

## 2017-09-18 DIAGNOSIS — R011 Cardiac murmur, unspecified: Secondary | ICD-10-CM | POA: Diagnosis not present

## 2017-09-18 DIAGNOSIS — Z7983 Long term (current) use of bisphosphonates: Secondary | ICD-10-CM | POA: Diagnosis not present

## 2017-09-18 DIAGNOSIS — J302 Other seasonal allergic rhinitis: Secondary | ICD-10-CM

## 2017-09-18 DIAGNOSIS — E039 Hypothyroidism, unspecified: Secondary | ICD-10-CM

## 2017-09-18 DIAGNOSIS — Z0000 Encounter for general adult medical examination without abnormal findings: Secondary | ICD-10-CM

## 2017-09-18 DIAGNOSIS — Z23 Encounter for immunization: Secondary | ICD-10-CM

## 2017-09-18 DIAGNOSIS — I10 Essential (primary) hypertension: Secondary | ICD-10-CM

## 2017-09-18 DIAGNOSIS — K5900 Constipation, unspecified: Secondary | ICD-10-CM | POA: Diagnosis not present

## 2017-09-18 DIAGNOSIS — R001 Bradycardia, unspecified: Secondary | ICD-10-CM | POA: Diagnosis not present

## 2017-09-18 DIAGNOSIS — Z79899 Other long term (current) drug therapy: Secondary | ICD-10-CM | POA: Diagnosis not present

## 2017-09-18 DIAGNOSIS — M810 Age-related osteoporosis without current pathological fracture: Secondary | ICD-10-CM

## 2017-09-18 DIAGNOSIS — M8580 Other specified disorders of bone density and structure, unspecified site: Secondary | ICD-10-CM

## 2017-09-18 LAB — GLUCOSE, CAPILLARY: Glucose-Capillary: 78 mg/dL (ref 65–99)

## 2017-09-18 LAB — POCT GLYCOSYLATED HEMOGLOBIN (HGB A1C): Hemoglobin A1C: 5.2

## 2017-09-18 MED ORDER — ALENDRONATE SODIUM 70 MG PO TABS: 70.0000 mg | ORAL_TABLET | ORAL | 2 refills | Status: DC

## 2017-09-18 MED ORDER — POLYETHYLENE GLYCOL 3350 17 G PO PACK: 17.0000 g | PACK | Freq: Every day | ORAL | 2 refills | Status: DC

## 2017-09-18 MED ORDER — METOPROLOL TARTRATE 50 MG PO TABS: 50.0000 mg | ORAL_TABLET | Freq: Every day | ORAL | 1 refills | Status: DC

## 2017-09-18 MED ORDER — FLUTICASONE PROPIONATE 50 MCG/ACT NA SUSP: 2.0000 | Freq: Every day | NASAL | 2 refills | Status: DC

## 2017-09-18 MED ORDER — AMLODIPINE BESYLATE 10 MG PO TABS: 10.0000 mg | ORAL_TABLET | Freq: Every day | ORAL | 1 refills | Status: DC

## 2017-09-18 NOTE — Patient Instructions (Addendum)
Thank you for coming to see me today. It was a pleasure. Today we talked about:   We updated your medication list and made a couple changes: 1. Decrease your metoprolol dose to 50mg  daily.  I sent in a new prescription for you 2. I refilled your amlodipine for blood pressure 3. STOP using the Afrin nasal spray every day as this can cause a rebounding of your symptoms 4.  START taking Flonase for allergies.  I have sent this to your pharmacy. 5. I have sent in MiraLax to your pharmacy.  If this is too expensive, Metamucil may be cheaper. 6. I  Have refilled your Fosamax  Please follow-up with [place] in 1 month for a blood pressure follow up appointment.  We also checked some basic labs today and will let you know if anything is abnormal.  If you have any questions or concerns, please do not hesitate to call the office at [phone].  Take Care,   Jule Ser, DO

## 2017-09-18 NOTE — Progress Notes (Signed)
CC: here for follow up HTN, osteopenia and establish care  HPI:  [name] is a 84 y.o. woman with a past medical history listed below here today for follow up of her HTN and osteopenia.  She is also here to establish care.  For details of today's visit and the status of her chronic medical issues please refer to the assessment and plan.   Past Medical History:  Diagnosis Date  . Arrhythmia   . Bleeding ulcer   . Cancer ([place])   . Hiatal hernia   . Hypertension   . Hyperthyroidism   . Osteoporosis    Review of Systems:  Please see pertinent ROS reviewed in HPI and problem based charting.   Physical Exam:  Vitals:   09/18/17 0847  BP: (!) 186/81  Pulse: (!) 50  Temp: 97.8 F (36.6 C)  TempSrc: Oral  SpO2: 100%  Weight: 111 lb 9.6 oz (50.6 kg)  Height: 5\' 5"  (1.651 m)   Physical Exam  Constitutional: She is oriented to person, place, and time and well-developed, well-nourished, and in no distress.  Thin, non-ill appearing, AA woman  HENT:  Head: Normocephalic and atraumatic.  Eyes: Conjunctivae and EOM are normal.  Neck: Normal range of motion. Neck supple. No thyromegaly present.  Cardiovascular: Intact distal pulses.  Murmur heard. Her rate is bradycardic with occasional skipped beats.  She has a soft 2/6 systolic murmur  Pulmonary/Chest: Effort normal and breath sounds normal. She has no wheezes. She has no rales.  Abdominal: Soft. Bowel sounds are normal. There is no tenderness. There is no rebound.  Musculoskeletal: Normal range of motion. She exhibits no edema.  Neurological: She is alert and oriented to person, place, and time. No cranial nerve deficit.  Skin: Skin is warm and dry.  Psychiatric: Mood and affect normal.     Assessment & Plan:   See Encounters Tab for problem based charting.  Patient discussed with Dr. Rebeca Alert.  Essential hypertension Assessment: Her BP is above goal today recorded at 186/81 with a HR of 50.  She is  asymptomatic and denies CP, SOB, lightheadedness, dizziness.  She overall feels very well.  She is prescribed metoprolol 100mg  daily and amlodipine 10mg  daily.  She took her metoprolol yesterday and it has been about 1 week since she last took amlodipine.  She has been on both medications for several years.  She has no known history of coronary artery disease or heart failure.  She had a normal nuclear stress test in 2010 where there were multiple PVC and PAC noted on EKG.  It is unclear if this is the reason for her to be on a beta blocker or not.  Plan: - Decrease metoprolol to 50mg  daily and new prescription sent - Refilled her amlodipine 10mg  daily - Check A1c today to ensure no DM - She is a never smoker - RTC in 1 month for BP follow up.  If no clear indication for beta blocker therapy, she may benefit from an alternative antihypertensive medication to gain better control  Hyperthyroidism Assessment: Hyperthyroidism noted on patient PMHx.  TSH checked in Oct 2017 with low result of 0.14 and no further labs from there.  She is on no medications for thyroid.  She does not have any symptoms of overt hypothyroid or hyperthyroidism.  Plan: - Check TSH and free T4  Osteoporosis Assessment: She has osteopenia based on DEXA from 2017 with T-score -2.5 and -1.0.  Her FRAX 10 year major osteoporotic  risk is 9.5%. 10 year hip fracture risk: 3.4%.  She is on Fosamax as a result of this FRAX score as well as calcium and vitamin D supplementation.  She has not had any recent falls.  Plan: - Continue Fosamax 70 mg weekly - Continue calcium and vitamin D supplementation  Constipation Assessment: She reports occasional constipation relieved with Miralax.  Plan: - Prescription sent in for Miralax PRN.  Advised that Metamucil is another good alternative.  Seasonal allergies Assessment: She has been using Afrin daily.  Medication list also included Zyrtec and Claritin which she reports not  taking.  Plan: - Advised to not use Afrin daily due to potential rebound effect - Prescription given for Flonase to use daily - Discontinue antihistamine and evaluate response to Flonase alone to avoid polypharmacy and potential unnecessary antihistamine use in this 84 year old patient.  Healthcare maintenance Assessment/Plan: - She was given PPSV 23 today after receiving PCV 13 in 2017. - She got the flu shot in Oct 2018. - She was given tetanus booster today - She may need shingles vaccine and this can be addressed at her next follow up appointment.

## 2017-09-18 NOTE — Assessment & Plan Note (Signed)
Assessment/Plan: - She was given PPSV 23 today after receiving PCV 13 in 2017. - She got the flu shot in Oct 2018. - She was given tetanus booster today - She may need shingles vaccine and this can be addressed at her next follow up appointment.

## 2017-09-18 NOTE — Assessment & Plan Note (Signed)
Assessment: Hyperthyroidism noted on patient PMHx.  TSH checked in Oct 2017 with low result of 0.14 and no further labs from there.  She is on no medications for thyroid.  She does not have any symptoms of overt hypothyroid or hyperthyroidism.  Plan: - Check TSH and free T4

## 2017-09-18 NOTE — Assessment & Plan Note (Signed)
Assessment: She has been using Afrin daily.  Medication list also included Zyrtec and Claritin which she reports not taking.  Plan: - Advised to not use Afrin daily due to potential rebound effect - Prescription given for Flonase to use daily - Discontinue antihistamine and evaluate response to Flonase alone to avoid polypharmacy and potential unnecessary antihistamine use in this 84 year old patient.

## 2017-09-18 NOTE — Assessment & Plan Note (Signed)
Assessment: She has osteopenia based on DEXA from 2017 with T-score -2.5 and -1.0.  Her FRAX 10 year major osteoporotic risk is 9.5%. 10 year hip fracture risk: 3.4%.  She is on Fosamax as a result of this FRAX score as well as calcium and vitamin D supplementation.  She has not had any recent falls.  Plan: - Continue Fosamax 70 mg weekly - Continue calcium and vitamin D supplementation

## 2017-09-18 NOTE — Assessment & Plan Note (Signed)
Assessment: Her BP is above goal today recorded at 186/81 with a HR of 50.  She is asymptomatic and denies CP, SOB, lightheadedness, dizziness.  She overall feels very well.  She is prescribed metoprolol 100mg  daily and amlodipine 10mg  daily.  She took her metoprolol yesterday and it has been about 1 week since she last took amlodipine.  She has been on both medications for several years.  She has no known history of coronary artery disease or heart failure.  She had a normal nuclear stress test in 2010 where there were multiple PVC and PAC noted on EKG.  It is unclear if this is the reason for her to be on a beta blocker or not.  Plan: - Decrease metoprolol to 50mg  daily and new prescription sent - Refilled her amlodipine 10mg  daily - Check A1c today to ensure no DM - She is a never smoker - RTC in 1 month for BP follow up.  If no clear indication for beta blocker therapy, she may benefit from an alternative antihypertensive medication to gain better control

## 2017-09-18 NOTE — Assessment & Plan Note (Signed)
Assessment: She reports occasional constipation relieved with Miralax.  Plan: - Prescription sent in for Miralax PRN.  Advised that Metamucil is another good alternative.

## 2017-09-19 LAB — T4, FREE: Free T4: 1.19 ng/dL (ref 0.82–1.77)

## 2017-09-19 LAB — TSH: TSH: 0.102 u[IU]/mL — ABNORMAL LOW (ref 0.450–4.500)

## 2017-09-19 NOTE — Progress Notes (Signed)
Internal Medicine Clinic Attending  Case discussed with Dr. Juleen China  at the time of the visit.  We reviewed the resident's history and exam and pertinent patient test results.  I agree with the assessment, diagnosis, and plan of care documented in the resident's note.  84 yo F here to establish care. Reported history of hypothyroidism, but unclear how much work up has been done in the past. Her TSH remains 0.1 today with normal FT4, with no overt symptoms, suggestive of subclinical hyperthyroidism (although would need T3 value to confirm). Given age and osteopenia, she is at risk for complications and thus deserves further work up. Reasonable first step would be TRAb titer, if elevated would suggest Graves and would be reasonable to treat. If TRAb is normal, would next evaluate with radioactive iodine uptake to evaluate for an autonomous nodule.   She is bradycardic and hypertensive today, will resume BP meds as per Dr. Alcario Drought plan and follow closely.  Oda Kilts, MD

## 2017-10-04 NOTE — Progress Notes (Signed)
Subjective:  HPI: Rebecca Estes is a 84 y.o. female who presents for follow up of HTN  Please see Assessment and Plan below for the status of her chronic medical problems.  Review of Systems: Review of Systems  Constitutional: Negative for fever and malaise/fatigue.  HENT: Negative for congestion, hearing loss and sinus pain.   Eyes: Negative for blurred vision.  Respiratory: Negative for cough and shortness of breath.   Cardiovascular: Positive for palpitations. Negative for chest pain, claudication, leg swelling and PND.  Gastrointestinal: Negative for abdominal pain, nausea and vomiting.  Genitourinary: Negative for frequency.  Musculoskeletal: Negative for myalgias.  Neurological: Negative for dizziness.  Psychiatric/Behavioral: Negative for depression.    Objective:  Physical Exam: Vitals:   10/05/17 0822  BP: (!) 162/80  Pulse: (!) 57  Temp: (!) 97.5 F (36.4 C)  TempSrc: Oral  SpO2: 100%  Weight: 108 lb 11.2 oz (49.3 kg)  Height: 5\' 5"  (1.651 m)   Physical Exam  Constitutional: She is well-developed, well-nourished, and in no distress.  thin  Neck: No thyromegaly present.  Cardiovascular: Normal rate and regular rhythm.  2/6 systolic murmur  Abdominal: Soft. Bowel sounds are normal.  Musculoskeletal: She exhibits no edema.  Nursing note and vitals reviewed.  Assessment & Plan:  Subclinical hyperthyroidism HPI: In review of her chart it appears she has a history of multinodular goiter, she is able to tell me she had a biopsy and was told everything was fine.  In my review of her previous records in the EHR, she had a thyroid uptake scan in 2004 with slightly increased uptake, it appears she had a FNA of a thryoid nodule in 2008 that was consistent with a benign hyperplastic nodule.  She notes that doctors have been monitoring her thyroid but she had never needed to take medication for it. She does report to me intermittent palpitations but otherwise no signs  or symptoms of hyperthyroidism.  A: Subclinical hyperthyroidism  P: Check TRA Monitor TSH and Free T4 q6 months.  Essential hypertension HPI: Asymptomatic, taking amlodipine 10mg  and metoprolol tartrate 50mg  daily.  A: Essential HTN uncontrolled  P:Unclear why she is on metoprolol tartrate only once daily.  Pulse is borderline low. - Continue amlodipine 10mg  daily -Change Metoprolol to succinate continue 50mg  once daily - Start Chlorthalidone 25mg  daily  Osteoporosis -Check Vit D  Palpitations HPI:  About 2-3 times a week she feels her heart starts racing, associated with a flutter filling in her chest.  She typically just rests till it goes away.    A: Palpitations  P; EKG obtained and reviewed: sinus bradycardia. -Arrange for cardiac event monitor - Given her systolic murmur will also obtain echo.   Medications Ordered Meds ordered this encounter  Medications  . metoprolol succinate (TOPROL XL) 50 MG 24 hr tablet    Sig: Take 1 tablet (50 mg total) by mouth daily. Take with or immediately following a meal.    Dispense:  90 tablet    Refill:  3    Discontinue metoprolol tartrate.  . chlorthalidone (HYGROTON) 25 MG tablet    Sig: Take 1 tablet (25 mg total) by mouth daily.    Dispense:  90 tablet    Refill:  3   Other Orders Orders Placed This Encounter  Procedures  . Cardiac event monitor    Standing Status:   Future    Standing Expiration Date:   10/05/2018  . EKG 12-Lead  . ECHOCARDIOGRAM COMPLETE    Palpitations,  systolic murmur    Standing Status:   Future    Standing Expiration Date:   01/03/2019    Order Specific Question:   Where should this test be performed    Answer:   [facility]    Order Specific Question:   Perflutren DEFINITY (image enhancing agent) should be administered unless hypersensitivity or allergy exist    Answer:   Administer Perflutren    Order Specific Question:   Expected Date:    Answer:   1 week  Unfortunately patient  left without labs being collected, will plan to obtain labs at follow up visit. Follow Up: Return in about 2 months (around 12/03/2017).

## 2017-10-05 ENCOUNTER — Other Ambulatory Visit: Payer: Self-pay

## 2017-10-05 ENCOUNTER — Encounter: Payer: Self-pay | Admitting: Internal Medicine

## 2017-10-05 ENCOUNTER — Ambulatory Visit (HOSPITAL_COMMUNITY)
Admission: RE | Admit: 2017-10-05 | Discharge: 2017-10-05 | Disposition: A | Discharge status: Home / Self Care | Payer: Medicare HMO | Source: Ambulatory Visit | Attending: Internal Medicine | Admitting: Internal Medicine

## 2017-10-05 ENCOUNTER — Ambulatory Visit (INDEPENDENT_AMBULATORY_CARE_PROVIDER_SITE_OTHER): Payer: Medicare HMO | Admitting: Internal Medicine

## 2017-10-05 VITALS — BP 162/80 | HR 57 | Temp 97.5°F | Ht 65.0 in | Wt 108.7 lb

## 2017-10-05 DIAGNOSIS — Z79899 Other long term (current) drug therapy: Secondary | ICD-10-CM | POA: Diagnosis not present

## 2017-10-05 DIAGNOSIS — R001 Bradycardia, unspecified: Secondary | ICD-10-CM | POA: Insufficient documentation

## 2017-10-05 DIAGNOSIS — R011 Cardiac murmur, unspecified: Secondary | ICD-10-CM | POA: Diagnosis not present

## 2017-10-05 DIAGNOSIS — M810 Age-related osteoporosis without current pathological fracture: Secondary | ICD-10-CM | POA: Diagnosis not present

## 2017-10-05 DIAGNOSIS — R002 Palpitations: Secondary | ICD-10-CM | POA: Insufficient documentation

## 2017-10-05 DIAGNOSIS — E0590 Thyrotoxicosis, unspecified without thyrotoxic crisis or storm: Secondary | ICD-10-CM | POA: Diagnosis not present

## 2017-10-05 DIAGNOSIS — I10 Essential (primary) hypertension: Secondary | ICD-10-CM | POA: Diagnosis not present

## 2017-10-05 DIAGNOSIS — Z86018 Personal history of other benign neoplasm: Secondary | ICD-10-CM | POA: Diagnosis not present

## 2017-10-05 MED ORDER — METOPROLOL SUCCINATE ER 50 MG PO TB24: 50.0000 mg | ORAL_TABLET | Freq: Every day | ORAL | 3 refills | Status: DC

## 2017-10-05 MED ORDER — CHLORTHALIDONE 25 MG PO TABS: 25.0000 mg | ORAL_TABLET | Freq: Every day | ORAL | 3 refills | Status: DC

## 2017-10-05 NOTE — Patient Instructions (Signed)
I want you to start taking chlorthalidone 25mg  daily for your blood pressure.  I have changed you metoprolol tartrate to metoprolol succinate (lasts 24 hours).

## 2017-10-05 NOTE — Assessment & Plan Note (Addendum)
HPI:  About 2-3 times a week she feels her heart starts racing, associated with a flutter filling in her chest.  She typically just rests till it goes away.    A: Palpitations  P; EKG obtained and reviewed: sinus bradycardia. -Arrange for cardiac event monitor - Given her systolic murmur will also obtain echo.

## 2017-10-05 NOTE — Assessment & Plan Note (Signed)
Check Vit D 

## 2017-10-05 NOTE — Assessment & Plan Note (Signed)
HPI: In review of her chart it appears she has a history of multinodular goiter, she is able to tell me she had a biopsy and was told everything was fine.  In my review of her previous records in the EHR, she had a thyroid uptake scan in 2004 with slightly increased uptake, it appears she had a FNA of a thryoid nodule in 2008 that was consistent with a benign hyperplastic nodule.  She notes that doctors have been monitoring her thyroid but she had never needed to take medication for it. She does report to me intermittent palpitations but otherwise no signs or symptoms of hyperthyroidism.  A: Subclinical hyperthyroidism  P: Check TRA Monitor TSH and Free T4 q6 months.

## 2017-10-05 NOTE — Assessment & Plan Note (Signed)
HPI: Asymptomatic, taking amlodipine 10mg  and metoprolol tartrate 50mg  daily.  A: Essential HTN uncontrolled  P:Unclear why she is on metoprolol tartrate only once daily.  Pulse is borderline low. - Continue amlodipine 10mg  daily -Change Metoprolol to succinate continue 50mg  once daily - Start Chlorthalidone 25mg  daily

## 2017-10-18 ENCOUNTER — Other Ambulatory Visit (HOSPITAL_COMMUNITY): Payer: Medicare HMO

## 2017-10-24 ENCOUNTER — Ambulatory Visit: Payer: Medicare HMO

## 2017-10-24 ENCOUNTER — Ambulatory Visit (HOSPITAL_COMMUNITY): Payer: Medicare HMO | Attending: Cardiology

## 2017-10-24 ENCOUNTER — Other Ambulatory Visit: Payer: Self-pay

## 2017-10-24 DIAGNOSIS — I081 Rheumatic disorders of both mitral and tricuspid valves: Secondary | ICD-10-CM | POA: Insufficient documentation

## 2017-10-24 DIAGNOSIS — R002 Palpitations: Secondary | ICD-10-CM

## 2017-10-24 DIAGNOSIS — I420 Dilated cardiomyopathy: Secondary | ICD-10-CM | POA: Diagnosis not present

## 2017-10-26 ENCOUNTER — Ambulatory Visit (INDEPENDENT_AMBULATORY_CARE_PROVIDER_SITE_OTHER): Payer: Medicare HMO

## 2017-10-26 DIAGNOSIS — R002 Palpitations: Secondary | ICD-10-CM | POA: Diagnosis not present

## 2017-11-06 ENCOUNTER — Encounter: Payer: Self-pay | Admitting: Internal Medicine

## 2017-11-06 DIAGNOSIS — I7121 Aneurysm of the ascending aorta, without rupture: Secondary | ICD-10-CM

## 2017-11-06 DIAGNOSIS — I712 Thoracic aortic aneurysm, without rupture: Secondary | ICD-10-CM

## 2017-11-06 HISTORY — DX: Thoracic aortic aneurysm, without rupture: I71.2

## 2017-11-06 HISTORY — DX: Aneurysm of the ascending aorta, without rupture: I71.21

## 2017-11-16 ENCOUNTER — Encounter: Payer: Self-pay | Admitting: Internal Medicine

## 2017-11-16 ENCOUNTER — Encounter: Payer: Medicare HMO | Admitting: Internal Medicine

## 2017-11-29 NOTE — Progress Notes (Signed)
Subjective:  HPI: [name] is a 84 y.o. female who presents for HTN and follow up of abnormal Echocardiogram.  Please see Assessment and Plan below for the status of her chronic medical problems.  Review of Systems: Review of Systems  Constitutional: Negative for fever and malaise/fatigue.  Respiratory: Negative for cough and shortness of breath.   Cardiovascular: Negative for chest pain, palpitations, claudication and leg swelling.  Musculoskeletal: Negative for joint pain and myalgias.  Neurological: Negative for sensory change, speech change and headaches.  Psychiatric/Behavioral: The patient has insomnia.     Objective:  Physical Exam: Vitals:   11/30/17 0828  BP: (!) 153/86  Pulse: (!) 53  Temp: (!) 97.5 F (36.4 C)  TempSrc: Oral  SpO2: 100%  Weight: 109 lb 1.6 oz (49.5 kg)  Height: 5\' 5"  (1.651 m)   Physical Exam  Constitutional: She appears well-developed and well-nourished.  Cardiovascular: Normal rate and regular rhythm.  2/6 systolic murmur  Pulmonary/Chest: Effort normal and breath sounds normal.  Abdominal: Soft. Bowel sounds are normal.  Musculoskeletal: She exhibits no edema.  Nursing note and vitals reviewed.  Assessment & Plan:  Thoracic ascending aortic aneurysm (HCC) HPI: had echo last month that revealed thoracic aortic aneurysm measuring 1mm.  She has a family history of intracerebral aneurysms of both her father and sister.  She is asymptomatic. She is a never smoker.  A: Thoracic ascending aortic aneurysm  P: She may be at higher risk given 2 first degree family members with history of Intracerebral aneurysms.  Will obtain CT angio of head as well as chest/abdomen to evaluate for other aneurysms. Needs better BP control Will start Pravastatin 40mg  daily  Essential hypertension HPI: she is taking chlorthalidone 25mg  daily as well as metoprolol xl 50mg  daily.  She was confused and stopped amlodipine when chlorthalidone was  started.  A: essential HTN, improved but not at goal  P: Continue metoprolol xl 50mg  daily Continue chlorthalidone 25mg  daily Restart amlodipine 10mg  daily  Osteoporosis No complaints, taking fosamax weekly  Palpitations She completed the echo, cardiac monitor still says pending, she reports she lost it at first but recently found and returned to cardiology office.  Notes palpitations improved dramatically on chlorthalidone.   Medications Ordered Meds ordered this encounter  Medications  . amLODipine (NORVASC) 10 MG tablet    Sig: Take 1 tablet (10 mg total) by mouth daily.    Dispense:  90 tablet    Refill:  3  . alendronate (FOSAMAX) 70 MG tablet    Sig: Take 1 tablet (70 mg total) by mouth once a week. Take with a full glass of water on an empty stomach.    Dispense:  12 tablet    Refill:  3  . pravastatin (PRAVACHOL) 40 MG tablet    Sig: Take 1 tablet (40 mg total) by mouth every evening.    Dispense:  90 tablet    Refill:  3   Other Orders Orders Placed This Encounter  Procedures  . CT Angio Abd/Pel w/ and/or w/o    Standing Status:   Future    Standing Expiration Date:   01/31/2019    Order Specific Question:   If indicated for the ordered procedure, I authorize the administration of contrast media per Radiology protocol    Answer:   Yes    Order Specific Question:   Preferred imaging location?    Answer:   [facility]    Order Specific Question:   Radiology Contrast  Protocol - do NOT remove file path    Answer:   \\[email or web address]  . CT ANGIO HEAD W OR WO CONTRAST    Standing Status:   Future    Standing Expiration Date:   01/31/2019    Order Specific Question:   If indicated for the ordered procedure, I authorize the administration of contrast media per Radiology protocol    Answer:   Yes    Order Specific Question:   Preferred imaging location?    Answer:   [facility]    Order Specific Question:   Radiology Contrast  Protocol - do NOT remove file path    Answer:   \\[email or web address]    Order Specific Question:   Reason for Exam additional comments    Answer:   Known thoracic aortic anyeruysm, family history of intracerebral aneurysm.  . BMP8+Anion Gap  . Lipid Profile   Follow Up: Return in about 2 months (around 01/30/2018).

## 2017-11-30 ENCOUNTER — Encounter: Payer: Self-pay | Admitting: Internal Medicine

## 2017-11-30 ENCOUNTER — Ambulatory Visit (INDEPENDENT_AMBULATORY_CARE_PROVIDER_SITE_OTHER): Payer: Medicare HMO | Admitting: Internal Medicine

## 2017-11-30 ENCOUNTER — Other Ambulatory Visit: Payer: Self-pay

## 2017-11-30 VITALS — BP 153/86 | HR 53 | Temp 97.5°F | Ht 65.0 in | Wt 109.1 lb

## 2017-11-30 DIAGNOSIS — M810 Age-related osteoporosis without current pathological fracture: Secondary | ICD-10-CM | POA: Diagnosis not present

## 2017-11-30 DIAGNOSIS — Z79899 Other long term (current) drug therapy: Secondary | ICD-10-CM

## 2017-11-30 DIAGNOSIS — R011 Cardiac murmur, unspecified: Secondary | ICD-10-CM

## 2017-11-30 DIAGNOSIS — G4700 Insomnia, unspecified: Secondary | ICD-10-CM

## 2017-11-30 DIAGNOSIS — R002 Palpitations: Secondary | ICD-10-CM | POA: Diagnosis not present

## 2017-11-30 DIAGNOSIS — Z8249 Family history of ischemic heart disease and other diseases of the circulatory system: Secondary | ICD-10-CM | POA: Diagnosis not present

## 2017-11-30 DIAGNOSIS — I712 Thoracic aortic aneurysm, without rupture, unspecified: Secondary | ICD-10-CM

## 2017-11-30 DIAGNOSIS — I10 Essential (primary) hypertension: Secondary | ICD-10-CM | POA: Diagnosis not present

## 2017-11-30 DIAGNOSIS — I7121 Aneurysm of the ascending aorta, without rupture: Secondary | ICD-10-CM

## 2017-11-30 DIAGNOSIS — Z7983 Long term (current) use of bisphosphonates: Secondary | ICD-10-CM | POA: Diagnosis not present

## 2017-11-30 MED ORDER — AMLODIPINE BESYLATE 10 MG PO TABS: 10.0000 mg | ORAL_TABLET | Freq: Every day | ORAL | 3 refills | Status: DC

## 2017-11-30 MED ORDER — PRAVASTATIN SODIUM 40 MG PO TABS: 40.0000 mg | ORAL_TABLET | Freq: Every evening | ORAL | 3 refills | Status: DC

## 2017-11-30 MED ORDER — ALENDRONATE SODIUM 70 MG PO TABS: 70.0000 mg | ORAL_TABLET | ORAL | 3 refills | Status: DC

## 2017-11-30 NOTE — Assessment & Plan Note (Signed)
No complaints, taking fosamax weekly

## 2017-11-30 NOTE — Assessment & Plan Note (Signed)
HPI: she is taking chlorthalidone 25mg  daily as well as metoprolol xl 50mg  daily.  She was confused and stopped amlodipine when chlorthalidone was started.  A: essential HTN, improved but not at goal  P: Continue metoprolol xl 50mg  daily Continue chlorthalidone 25mg  daily Restart amlodipine 10mg  daily

## 2017-11-30 NOTE — Assessment & Plan Note (Signed)
She completed the echo, cardiac monitor still says pending, she reports she lost it at first but recently found and returned to cardiology office.  Notes palpitations improved dramatically on chlorthalidone.

## 2017-11-30 NOTE — Assessment & Plan Note (Addendum)
HPI: had echo last month that revealed thoracic aortic aneurysm measuring 11mm.  She has a family history of intracerebral aneurysms of both her father and sister.  She is asymptomatic. She is a never smoker.  A: Thoracic ascending aortic aneurysm  P: She may be at higher risk given 2 first degree family members with history of Intracerebral aneurysms.  Will obtain CT angio of head as well as chest/abdomen to evaluate for other aneurysms. Needs better BP control Will start Pravastatin 40mg  daily

## 2017-11-30 NOTE — Patient Instructions (Addendum)
I ordered a CT scan to evaluate your aorta for other aneurysms.

## 2017-12-01 LAB — BMP8+ANION GAP
ANION GAP: 16 mmol/L (ref 10.0–18.0)
BUN / CREAT RATIO: 29 — AB (ref 12–28)
BUN: 23 mg/dL (ref 8–27)
CHLORIDE: 101 mmol/L (ref 96–106)
CO2: 25 mmol/L (ref 20–29)
CREATININE: 0.78 mg/dL (ref 0.57–1.00)
Calcium: 10.3 mg/dL (ref 8.7–10.3)
GFR calc Af Amer: 82 mL/min/{1.73_m2} (ref >59)
GFR calc non Af Amer: 71 mL/min/{1.73_m2} (ref >59)
GLUCOSE: 90 mg/dL (ref 65–99)
Potassium: 4.1 mmol/L (ref 3.5–5.2)
SODIUM: 142 mmol/L (ref 134–144)

## 2017-12-01 LAB — LIPID PANEL
CHOLESTEROL TOTAL: 231 mg/dL — AB (ref 100–199)
Chol/HDL Ratio: 2.2 ratio (ref 0.0–4.4)
HDL: 103 mg/dL (ref >39)
LDL CALC: 117 mg/dL — AB (ref 0–99)
TRIGLYCERIDES: 56 mg/dL (ref 0–149)
VLDL Cholesterol Cal: 11 mg/dL (ref 5–40)

## 2018-01-03 ENCOUNTER — Encounter: Payer: Self-pay | Admitting: *Deleted

## 2018-01-25 ENCOUNTER — Ambulatory Visit: Payer: Medicare HMO | Admitting: Internal Medicine

## 2018-01-31 NOTE — Progress Notes (Signed)
Subjective:  HPI: Rebecca Estes is a 84 y.o. female who presents for HTN and Thoracic anyeursym.  Please see Assessment and Plan below for the status of her chronic medical problems.  Review of Systems: Review of Systems  Constitutional: Negative for fever, malaise/fatigue and weight loss.  Respiratory: Negative for cough and shortness of breath.   Cardiovascular: Negative for chest pain.  Gastrointestinal: Negative for heartburn.  Genitourinary: Negative for dysuria.  Neurological: Negative for dizziness.  Psychiatric/Behavioral: Negative for depression. The patient has insomnia.     Objective:  Physical Exam: Vitals:   02/01/18 0835  BP: 133/69  Pulse: (!) 42  Temp: 97.7 F (36.5 C)  TempSrc: Oral  SpO2: 100%  Weight: 110 lb (49.9 kg)  Height: 5\' 5"  (1.651 m)   Physical Exam  Constitutional: She appears well-developed and well-nourished.  HENT:  Head: Normocephalic and atraumatic.  Cardiovascular: Regular rhythm. Bradycardia present.  Murmur heard.  Systolic murmur is present with a grade of 2/6. Pulmonary/Chest: Effort normal and breath sounds normal.  Abdominal: Soft. Bowel sounds are normal.  Musculoskeletal: She exhibits no edema.   Assessment & Plan:  Essential hypertension HPI: brings pill bottles has metoprolol tartrate 50mg  (taking once daily) and amlodipine 10mg  daily, no taking chlorthalidone.  No side effects reported. No syncope, near syncope, not further palpitations  A: essential HTN, at goal  P: Continue metoprolol xl 50mg  daily  (discussed change from tartrate to succinate)- patient is bradycardic but not symptomatic, recent cardiac monitor showed PACs. Discontinue  chlorthalidone 25mg  daily (not taking Continue  amlodipine 10mg  daily  Thoracic ascending aortic aneurysm (HCC) HPI: As previously noted she was found to have a toracic ascending aortic aneurysm. We had discussed obtaining imaging of her abdominal aorta and cerebral vasculature  (family history of cerebral anyursms) however she never went for these studies, did not fully understand why they were needed since she is asymptomatic.  A: TAA  P: -I have again discussed in detail why we need additional imaging for other anyrusms at this point it would also be appropriate to reimage her thoraic anyursm.  Therefore I have ordered CTA of Chest/Abd/Pelvis and head.  I reordered a BMP to check serum creatine.  Palpitations Report back from cardiac monitor, NSR, occasional PAC. Patient reports less palpitations.  Body mass index (BMI) 19.9 or less, adult Patient is underweight, discussed she would beneift from gaining about 10 lbs.  She does have a low TSH however this is being monitored, currently subclinical hyperthyroidism.  I think the bigger cause may be poor fitting dentures making protein difficult to chew.  We discussed seeing the dentist for evaluation of dentures and adding protein shake supplements.   Medications Ordered Meds ordered this encounter  Medications  . metoprolol succinate (TOPROL XL) 50 MG 24 hr tablet    Sig: Take 1 tablet (50 mg total) by mouth daily. Take with or immediately following a meal.    Dispense:  90 tablet    Refill:  3    Discontinue metoprolol tartrate.  Marland Kitchen amLODipine (NORVASC) 10 MG tablet    Sig: Take 1 tablet (10 mg total) by mouth daily.    Dispense:  90 tablet    Refill:  3   Other Orders Orders Placed This Encounter  Procedures  . CTA CHEST/ABD/PEL DISSECTION W&/OR WO & GATING    Standing Status:   Future    Standing Expiration Date:   04/04/2019    Order Specific Question:   If indicated for  the ordered procedure, I authorize the administration of contrast media per Radiology protocol    Answer:   Yes  . CT ANGIO HEAD W OR WO CONTRAST    Standing Status:   Future    Standing Expiration Date:   04/04/2019    Order Specific Question:   If indicated for the ordered procedure, I authorize the administration of contrast media  per Radiology protocol    Answer:   Yes    Order Specific Question:   Preferred imaging location?    Answer:   [facility]    Order Specific Question:   Radiology Contrast Protocol - do NOT remove file path    Answer:   \\[email or web address]  . BMP8+Anion Gap   Follow Up: Return 4-6 months with Dr Heber [place].

## 2018-02-01 ENCOUNTER — Encounter: Payer: Self-pay | Admitting: Internal Medicine

## 2018-02-01 ENCOUNTER — Ambulatory Visit (INDEPENDENT_AMBULATORY_CARE_PROVIDER_SITE_OTHER): Payer: Medicare HMO | Admitting: Internal Medicine

## 2018-02-01 ENCOUNTER — Other Ambulatory Visit: Payer: Self-pay

## 2018-02-01 VITALS — BP 133/69 | HR 42 | Temp 97.7°F | Ht 65.0 in | Wt 110.0 lb

## 2018-02-01 DIAGNOSIS — Z972 Presence of dental prosthetic device (complete) (partial): Secondary | ICD-10-CM | POA: Diagnosis not present

## 2018-02-01 DIAGNOSIS — R002 Palpitations: Secondary | ICD-10-CM | POA: Diagnosis not present

## 2018-02-01 DIAGNOSIS — I10 Essential (primary) hypertension: Secondary | ICD-10-CM | POA: Diagnosis not present

## 2018-02-01 DIAGNOSIS — Z681 Body mass index (BMI) 19 or less, adult: Secondary | ICD-10-CM

## 2018-02-01 DIAGNOSIS — G4700 Insomnia, unspecified: Secondary | ICD-10-CM

## 2018-02-01 DIAGNOSIS — R011 Cardiac murmur, unspecified: Secondary | ICD-10-CM | POA: Diagnosis not present

## 2018-02-01 DIAGNOSIS — R636 Underweight: Secondary | ICD-10-CM | POA: Diagnosis not present

## 2018-02-01 DIAGNOSIS — I712 Thoracic aortic aneurysm, without rupture, unspecified: Secondary | ICD-10-CM

## 2018-02-01 DIAGNOSIS — R001 Bradycardia, unspecified: Secondary | ICD-10-CM | POA: Diagnosis not present

## 2018-02-01 DIAGNOSIS — Z79899 Other long term (current) drug therapy: Secondary | ICD-10-CM | POA: Diagnosis not present

## 2018-02-01 DIAGNOSIS — Z8249 Family history of ischemic heart disease and other diseases of the circulatory system: Secondary | ICD-10-CM

## 2018-02-01 DIAGNOSIS — K0889 Other specified disorders of teeth and supporting structures: Secondary | ICD-10-CM

## 2018-02-01 DIAGNOSIS — I7121 Aneurysm of the ascending aorta, without rupture: Secondary | ICD-10-CM

## 2018-02-01 MED ORDER — METOPROLOL SUCCINATE ER 50 MG PO TB24: 50.0000 mg | ORAL_TABLET | Freq: Every day | ORAL | 3 refills | Status: DC

## 2018-02-01 MED ORDER — AMLODIPINE BESYLATE 10 MG PO TABS: 10.0000 mg | ORAL_TABLET | Freq: Every day | ORAL | 3 refills | Status: DC

## 2018-02-01 NOTE — Patient Instructions (Signed)
I want to see you back in about 6 months. I do want you to get those CT scans of your blood vessels done.

## 2018-02-02 LAB — BMP8+ANION GAP
Anion Gap: 15 mmol/L (ref 10.0–18.0)
BUN / CREAT RATIO: 31 — AB (ref 12–28)
BUN: 20 mg/dL (ref 8–27)
CALCIUM: 9.7 mg/dL (ref 8.7–10.3)
CHLORIDE: 107 mmol/L — AB (ref 96–106)
CO2: 21 mmol/L (ref 20–29)
Creatinine, Ser: 0.65 mg/dL (ref 0.57–1.00)
GFR calc non Af Amer: 83 mL/min/{1.73_m2} (ref >59)
GFR, EST AFRICAN AMERICAN: 96 mL/min/{1.73_m2} (ref >59)
GLUCOSE: 93 mg/dL (ref 65–99)
POTASSIUM: 4.2 mmol/L (ref 3.5–5.2)
SODIUM: 143 mmol/L (ref 134–144)

## 2018-02-02 NOTE — Assessment & Plan Note (Signed)
Report back from cardiac monitor, NSR, occasional PAC. Patient reports less palpitations.

## 2018-02-02 NOTE — Assessment & Plan Note (Signed)
HPI: brings pill bottles has metoprolol tartrate 50mg  (taking once daily) and amlodipine 10mg  daily, no taking chlorthalidone.  No side effects reported. No syncope, near syncope, not further palpitations  A: essential HTN, at goal  P: Continue metoprolol xl 50mg  daily  (discussed change from tartrate to succinate)- patient is bradycardic but not symptomatic, recent cardiac monitor showed PACs. Discontinue  chlorthalidone 25mg  daily (not taking Continue  amlodipine 10mg  daily

## 2018-02-02 NOTE — Assessment & Plan Note (Signed)
HPI: As previously noted she was found to have a toracic ascending aortic aneurysm. We had discussed obtaining imaging of her abdominal aorta and cerebral vasculature (family history of cerebral anyursms) however she never went for these studies, did not fully understand why they were needed since she is asymptomatic.  A: TAA  P: -I have again discussed in detail why we need additional imaging for other anyrusms at this point it would also be appropriate to reimage her thoraic anyursm.  Therefore I have ordered CTA of Chest/Abd/Pelvis and head.  I reordered a BMP to check serum creatine.

## 2018-02-02 NOTE — Assessment & Plan Note (Signed)
Patient is underweight, discussed she would beneift from gaining about 10 lbs.  She does have a low TSH however this is being monitored, currently subclinical hyperthyroidism.  I think the bigger cause may be poor fitting dentures making protein difficult to chew.  We discussed seeing the dentist for evaluation of dentures and adding protein shake supplements.

## 2018-02-23 ENCOUNTER — Ambulatory Visit (HOSPITAL_COMMUNITY): Payer: Medicare HMO

## 2018-03-05 ENCOUNTER — Ambulatory Visit (HOSPITAL_COMMUNITY)
Admission: RE | Admit: 2018-03-05 | Discharge: 2018-03-05 | Disposition: A | Discharge status: Home / Self Care | Payer: Medicare HMO | Source: Ambulatory Visit | Attending: Internal Medicine | Admitting: Internal Medicine

## 2018-03-05 ENCOUNTER — Encounter (HOSPITAL_COMMUNITY): Payer: Self-pay

## 2018-03-05 DIAGNOSIS — I712 Thoracic aortic aneurysm, without rupture, unspecified: Secondary | ICD-10-CM

## 2018-03-05 DIAGNOSIS — I719 Aortic aneurysm of unspecified site, without rupture: Secondary | ICD-10-CM | POA: Diagnosis not present

## 2018-03-05 DIAGNOSIS — I7121 Aneurysm of the ascending aorta, without rupture: Secondary | ICD-10-CM

## 2018-03-05 DIAGNOSIS — I739 Peripheral vascular disease, unspecified: Secondary | ICD-10-CM | POA: Diagnosis not present

## 2018-03-05 DIAGNOSIS — J320 Chronic maxillary sinusitis: Secondary | ICD-10-CM | POA: Insufficient documentation

## 2018-03-05 MED ORDER — IOPAMIDOL (ISOVUE-370) INJECTION 76%
50.0000 mL | Freq: Once | INTRAVENOUS | Status: AC | PRN
  Administered 2018-03-05: 50 mL via INTRAVENOUS

## 2018-03-05 MED ORDER — IOPAMIDOL (ISOVUE-370) INJECTION 76%
INTRAVENOUS | Status: AC
  Filled 2018-03-05: qty 125

## 2018-03-05 MED ORDER — IOPAMIDOL (ISOVUE-370) INJECTION 76%
75.0000 mL | Freq: Once | INTRAVENOUS | Status: AC | PRN
  Administered 2018-03-05: 75 mL via INTRAVENOUS

## 2018-03-22 ENCOUNTER — Ambulatory Visit (HOSPITAL_COMMUNITY)
Admission: RE | Admit: 2018-03-22 | Discharge: 2018-03-22 | Disposition: A | Discharge status: Home / Self Care | Payer: Medicare HMO | Source: Ambulatory Visit | Attending: Internal Medicine | Admitting: Internal Medicine

## 2018-03-22 DIAGNOSIS — I712 Thoracic aortic aneurysm, without rupture, unspecified: Secondary | ICD-10-CM

## 2018-03-22 DIAGNOSIS — I700 Atherosclerosis of aorta: Secondary | ICD-10-CM | POA: Insufficient documentation

## 2018-03-22 MED ORDER — IOPAMIDOL (ISOVUE-370) INJECTION 76%
100.0000 mL | Freq: Once | INTRAVENOUS | Status: AC | PRN
  Administered 2018-03-22: 100 mL via INTRAVENOUS

## 2018-04-15 ENCOUNTER — Encounter: Payer: Self-pay | Admitting: Internal Medicine

## 2018-04-24 ENCOUNTER — Other Ambulatory Visit: Payer: Self-pay

## 2018-04-24 ENCOUNTER — Ambulatory Visit (INDEPENDENT_AMBULATORY_CARE_PROVIDER_SITE_OTHER): Payer: Medicare HMO | Admitting: Internal Medicine

## 2018-04-24 VITALS — BP 156/72 | HR 50 | Temp 98.0°F | Ht 65.0 in | Wt 103.0 lb

## 2018-04-24 DIAGNOSIS — R29898 Other symptoms and signs involving the musculoskeletal system: Secondary | ICD-10-CM

## 2018-04-24 DIAGNOSIS — M810 Age-related osteoporosis without current pathological fracture: Secondary | ICD-10-CM | POA: Diagnosis not present

## 2018-04-24 DIAGNOSIS — I712 Thoracic aortic aneurysm, without rupture: Secondary | ICD-10-CM

## 2018-04-24 DIAGNOSIS — I491 Atrial premature depolarization: Secondary | ICD-10-CM | POA: Diagnosis not present

## 2018-04-24 DIAGNOSIS — I10 Essential (primary) hypertension: Secondary | ICD-10-CM

## 2018-04-24 DIAGNOSIS — Z7983 Long term (current) use of bisphosphonates: Secondary | ICD-10-CM

## 2018-04-24 DIAGNOSIS — Z79899 Other long term (current) drug therapy: Secondary | ICD-10-CM | POA: Diagnosis not present

## 2018-04-24 MED ORDER — ALENDRONATE SODIUM 70 MG PO TABS: 70.0000 mg | ORAL_TABLET | ORAL | 3 refills | Status: DC

## 2018-04-24 NOTE — Patient Instructions (Signed)
I have refilled your fosamax.  Let us know if you start having any new symptoms including chest pain, shortness of breath, episodes of passing out, weakness on one side of your body

## 2018-04-24 NOTE — Assessment & Plan Note (Addendum)
Patient with short, intermittent episodes of leg fatigue with ambulation, however is otherwise a very active 84 yo. She denies classic claudication or cardiac symptoms. She has a stable ascending aortic aneurysm; had reassuring echo (10/2017) other than ascending aorta dilation and increased PA pressure to 50 and halter monitor which only captured PACs in 10/2017.   Etiology could be atypical presentation of cardiac disease or arrhythmia.  Will monitor for now and she will call if she has any new symptoms, symptoms are progressive and more frequent, interfere with daily activities; otherwise f/u in 3 months with PCP. Can consider stress test in the future.

## 2018-04-24 NOTE — Assessment & Plan Note (Signed)
Refill provided for alendronate. She has not had any falls since last visit.

## 2018-04-24 NOTE — Progress Notes (Signed)
   CC: fatigue  HPI:  [name] is a 84 y.o. with a PMH of thoracic ascending aortic aneurysm, osteoporosis, HTN, PACs presenting to clinic for evaluation of fatigue.  Patient is accompanied by her new fiance.  Patient states that she is very active at baseline and will walk at least 2 miles every day over hilly terrain and does not have issues with leg cramps, chest pain, shortness of breath, or fatigue most days. However she reports occasionally she will walk ~100-200 yards and have to stop due to bil leg fatigue which resolves soon after sitting down; she can carry on with the rest of her day w/o symptoms. She denies any other symptoms during these episodes; further more she in general denies palpitations, headaches, vision or hearing changes, dizziness, nausea, vomiting.   Please see problem based Assessment and Plan for status of patients chronic conditions.  Past Medical History:  Diagnosis Date  . Arrhythmia   . Bleeding ulcer   . Cancer ([place])   . Hiatal hernia   . Hypertension   . Hyperthyroidism   . Osteoporosis   . Thoracic ascending aortic aneurysm ([place]) 11/06/2017   39mm diagnosed by echo 10/24/17.    Review of Systems:   Per HPI  Physical Exam:  Vitals:   04/24/18 0943 04/24/18 1114  BP: (!) 159/72 (!) 156/72  Pulse: 65 (!) 50  Temp: 98 F (36.7 C)   TempSrc: Oral   SpO2: 100%   Weight: 103 lb (46.7 kg)   Height: 5\' 5"  (1.651 m)    GENERAL- alert, co-operative, appears as stated age, not in any distress. HEENT- EOMI, oral mucosa appears moist. CARDIAC- RRR, no murmurs, rubs or gallops. RESP- Moving equal volumes of air, and clear to auscultation bilaterally, no wheezes or crackles. ABDOMEN- Soft, nontender, bowel sounds present. NEURO- CN 2-12 grossly intact, strength and sensation intact throughout EXTREMITIES- pulse 2+ radial and PT/DP, symmetric, no pedal edema. SKIN- Warm, dry, no rash or lesion. PSYCH- Normal mood and affect, appropriate  thought content and speech.  Assessment & Plan:   See Encounters Tab for problem based charting.   Patient discussed with Dr. Moshe Cipro, MD Internal Medicine PGY-3

## 2018-04-24 NOTE — Assessment & Plan Note (Signed)
BP mildly elevated but patient had not yet taken her amlodipine and metoprolol. Will re-assess at f/u visit.

## 2018-04-25 NOTE — Progress Notes (Signed)
Internal Medicine Clinic Attending  Case discussed with Dr. Svalina at the time of the visit.  We reviewed the resident's history and exam and pertinent patient test results.  I agree with the assessment, diagnosis, and plan of care documented in the resident's note.  Alexander Raines, M.D., Ph.D.  

## 2018-05-10 ENCOUNTER — Ambulatory Visit (HOSPITAL_COMMUNITY)
Admission: EM | Admit: 2018-05-10 | Discharge: 2018-05-10 | Disposition: A | Discharge status: Home / Self Care | Payer: Medicare HMO | Attending: Family Medicine | Admitting: Family Medicine

## 2018-05-10 ENCOUNTER — Encounter (HOSPITAL_COMMUNITY): Payer: Self-pay | Admitting: Emergency Medicine

## 2018-05-10 DIAGNOSIS — J302 Other seasonal allergic rhinitis: Secondary | ICD-10-CM

## 2018-05-10 MED ORDER — TETRACAINE HCL 0.5 % OP SOLN
OPHTHALMIC | Status: AC
  Filled 2018-05-10: qty 4

## 2018-05-10 MED ORDER — FLUTICASONE PROPIONATE 50 MCG/ACT NA SUSP: 1.0000 | Freq: Every day | NASAL | 2 refills | Status: DC

## 2018-05-10 MED ORDER — CETIRIZINE HCL 5 MG PO TABS: 5.0000 mg | ORAL_TABLET | Freq: Every day | ORAL | 1 refills | Status: DC

## 2018-05-10 NOTE — ED Triage Notes (Signed)
Pt here for URI sx with cough worse at night

## 2018-05-10 NOTE — ED Provider Notes (Signed)
[facility]    CSN: [id number] Arrival date & time: 05/10/18  3664     History   Chief Complaint Chief Complaint  Patient presents with  . URI    HPI Rebecca Estes is a 84 y.o. female.    URI  Presenting symptoms: congestion, cough, rhinorrhea and sore throat   Presenting symptoms: no ear pain, no facial pain, no fatigue and no fever   Severity:  Mild Onset quality:  Gradual Duration:  2 weeks Timing:  Intermittent Progression:  Waxing and waning Chronicity:  New Relieved by:  OTC medications Worsened by:  Nothing Associated symptoms: sinus pain and swollen glands   Associated symptoms: no arthralgias, no headaches, no myalgias, no neck pain, no sneezing and no wheezing   Risk factors: no recent illness, no recent travel and no sick contacts     Past Medical History:  Diagnosis Date  . Arrhythmia   . Bleeding ulcer   . Cancer ([place])   . Hiatal hernia   . Hypertension   . Hyperthyroidism   . Osteoporosis   . Thoracic ascending aortic aneurysm ([place]) 11/06/2017   64mm diagnosed by echo 10/24/17.    Patient Active Problem List   Diagnosis Date Noted  . Leg fatigue 04/24/2018  . Body mass index (BMI) 19.9 or less, adult 02/01/2018  . Difficulty chewing due to dentures 02/01/2018  . Thoracic ascending aortic aneurysm ([place]) 11/06/2017  . Palpitations 10/05/2017  . Seasonal allergies 09/18/2017  . Healthcare maintenance 09/18/2017  . Essential hypertension 05/26/2016  . Osteoporosis 05/26/2016  . Subclinical hyperthyroidism 05/26/2016    Past Surgical History:  Procedure Laterality Date  . ANTERIOR CERVICAL DECOMP/DISCECTOMY FUSION     x3  . BREAST BIOPSY Left 02/10/2009  . NECK SURGERY    . ROTATOR CUFF REPAIR     Left  . TUBAL LIGATION    . UMBILICAL HERNIA REPAIR      OB History   None      Home Medications    Prior to Admission medications   Medication Sig Start Date End Date Taking? Authorizing Provider  alendronate (FOSAMAX)  70 MG tablet Take 1 tablet (70 mg total) by mouth once a week. Take with a full glass of water on an empty stomach. 04/24/18   Alphonzo Grieve, MD  amLODipine (NORVASC) 10 MG tablet Take 1 tablet (10 mg total) by mouth daily. 02/01/18   Lucious Groves, DO  aspirin EC 325 MG tablet Take 325 mg by mouth every morning.     [provider]  cetirizine (ZYRTEC) 5 MG tablet Take 1 tablet (5 mg total) by mouth daily. 05/10/18   Loura Halt A, NP  Cholecalciferol (VITAMIN D3) 1000 units CAPS Vitamin D3 1,000 unit capsule  Take 1 capsule every day by oral route.    [provider]  fluticasone (FLONASE) 50 MCG/ACT nasal spray Place 1 spray into both nostrils daily. 05/10/18   Loura Halt A, NP  metoprolol succinate (TOPROL XL) 50 MG 24 hr tablet Take 1 tablet (50 mg total) by mouth daily. Take with or immediately following a meal. 02/01/18 02/01/19  Lucious Groves, DO  polyethylene glycol (MIRALAX / GLYCOLAX) packet Take 17 g by mouth daily. 09/18/17   Jule Ser, DO  pravastatin (PRAVACHOL) 40 MG tablet Take 1 tablet (40 mg total) by mouth every evening. 11/30/17 11/30/18  Lucious Groves, DO    Family History Family History  Problem Relation Age of Onset  . Aneurysm  Father 29       Brain  . Other Mother 36       Blood Infection  . Coronary artery disease Brother 68  . Stroke Brother 5  . Aneurysm Sister 24       Brain  . Cervical cancer Sister 33  . Kidney failure Sister 39       was on Dialysis  . Aneurysm Sister        Brain    Social History Social History   Tobacco Use  . Smoking status: Never Smoker  . Smokeless tobacco: Never Used  Substance Use Topics  . Alcohol use: No  . Drug use: No     Allergies   Methocarbamol   Review of Systems Review of Systems  Constitutional: Negative for fatigue and fever.  HENT: Positive for congestion, rhinorrhea, sinus pain and sore throat. Negative for ear pain and sneezing.   Respiratory: Positive for cough. Negative  for wheezing.   Musculoskeletal: Negative for arthralgias, myalgias and neck pain.  Neurological: Negative for headaches.     Physical Exam Triage Vital Signs ED Triage Vitals  Enc Vitals Group     BP 05/10/18 1037 138/76     Pulse Rate 05/10/18 1037 69     Resp 05/10/18 1037 18     Temp 05/10/18 1037 97.7 F (36.5 C)     Temp Source 05/10/18 1037 Oral     SpO2 05/10/18 1037 95 %     Weight --      Height --      Head Circumference --      Peak Flow --      Pain Score 05/10/18 1038 0     Pain Loc --      Pain Edu? --      Excl. in [place]? --    No data found.  Updated Vital Signs BP 138/76 (BP Location: Left Arm)   Pulse 69   Temp 97.7 F (36.5 C) (Oral)   Resp 18   SpO2 95%   Visual Acuity Right Eye Distance:   Left Eye Distance:   Bilateral Distance:    Right Eye Near:   Left Eye Near:    Bilateral Near:     Physical Exam  Constitutional: She is oriented to person, place, and time. She appears well-developed and well-nourished.  Very pleasant. Non toxic or ill appearing.     HENT:  Bilateral TMs normal.  External ears normal.  Without posterior oropharyngeal erythema, tonsillar swelling or exudates. No lesions.  Moderate drainage to posterior oropharynx. Nasal turbinate swelling Clear drainage from nares.  No lymphadenopathy.     Eyes: Conjunctivae are normal.  Neck: Normal range of motion.  Cardiovascular: Normal rate, regular rhythm and normal heart sounds.  Pulmonary/Chest: Effort normal and breath sounds normal.  Lungs clear in all fields. No dyspnea or distress. No retractions or nasal flaring.    Musculoskeletal: Normal range of motion.  Lymphadenopathy:    She has no cervical adenopathy.  Neurological: She is alert and oriented to person, place, and time.  Skin: Skin is warm and dry.  Psychiatric: She has a normal mood and affect.  Nursing note and vitals reviewed.    UC Treatments / Results  Labs (all labs ordered are listed, but only  abnormal results are displayed) Labs Reviewed - No data to display  EKG None  Radiology No results found.  Procedures Procedures (including critical care time)  Medications Ordered in UC Medications -  No data to display  Initial Impression / Assessment and Plan / UC Course  I have reviewed the triage vital signs and the nursing notes.  Pertinent labs & imaging results that were available during my care of the patient were reviewed by me and considered in my medical decision making (see chart for details).     I believe this is allergy related Flonase and Zyrtec for symptoms Continue the Robitussin for cough as needed Follow up as needed for continued or worsening symptoms  Final Clinical Impressions(s) / UC Diagnoses   Final diagnoses:  Seasonal allergies     Discharge Instructions     It was nice meeting you!!  I believe that your symptoms are related to allergies.  We will try Flonase nasal spray and zyrtec for symptoms Continue the Robitussin for cough Follow up as needed for continued or worsening symptoms     ED Prescriptions    Medication Sig Dispense Auth. Provider   fluticasone (FLONASE) 50 MCG/ACT nasal spray Place 1 spray into both nostrils daily. 16 g Jaishawn Witzke A, NP   cetirizine (ZYRTEC) 5 MG tablet Take 1 tablet (5 mg total) by mouth daily. 30 tablet Loura Halt A, NP     Controlled Substance Prescriptions [place] Controlled Substance Registry consulted? Not Applicable   Orvan July, NP 05/10/18 1117

## 2018-05-10 NOTE — Discharge Instructions (Addendum)
It was nice meeting you!!  I believe that your symptoms are related to allergies.  We will try Flonase nasal spray and zyrtec for symptoms Continue the Robitussin for cough Follow up as needed for continued or worsening symptoms

## 2018-07-24 ENCOUNTER — Encounter: Payer: Self-pay | Admitting: Internal Medicine

## 2018-07-24 ENCOUNTER — Ambulatory Visit (INDEPENDENT_AMBULATORY_CARE_PROVIDER_SITE_OTHER): Payer: Medicare HMO | Admitting: Internal Medicine

## 2018-07-24 ENCOUNTER — Other Ambulatory Visit: Payer: Self-pay

## 2018-07-24 VITALS — BP 153/81 | HR 120 | Temp 98.0°F | Ht 62.0 in | Wt 103.1 lb

## 2018-07-24 DIAGNOSIS — M25511 Pain in right shoulder: Secondary | ICD-10-CM | POA: Diagnosis not present

## 2018-07-24 DIAGNOSIS — M25512 Pain in left shoulder: Secondary | ICD-10-CM

## 2018-07-24 DIAGNOSIS — G8929 Other chronic pain: Secondary | ICD-10-CM | POA: Diagnosis not present

## 2018-07-24 DIAGNOSIS — I700 Atherosclerosis of aorta: Secondary | ICD-10-CM | POA: Insufficient documentation

## 2018-07-24 MED ORDER — METOPROLOL SUCCINATE ER 50 MG PO TB24: 50.0000 mg | ORAL_TABLET | Freq: Every day | ORAL | 3 refills | Status: DC

## 2018-07-24 MED ORDER — DICLOFENAC SODIUM 1 % TD GEL: 2.0000 g | Freq: Four times a day (QID) | TRANSDERMAL | 2 refills | Status: DC

## 2018-07-24 MED ORDER — AMLODIPINE BESYLATE 10 MG PO TABS: 10.0000 mg | ORAL_TABLET | Freq: Every day | ORAL | 3 refills | Status: DC

## 2018-07-24 NOTE — Patient Instructions (Signed)
Ms. Rebecca Estes,  I am sorry to hear about your shoulder pain. I want you to try using Voltaren gel up to four times daily on your shoulders. You can also try taking Tylenol 650 mg every 6 hours as needed. If your pain worsens I want you to come back to the clinic and consider getting a steroid injection.

## 2018-07-24 NOTE — Assessment & Plan Note (Addendum)
Patient reported she has been having bilateral shoulder pain for the past 6 months that is worsened over the last 2 months.  She described the pain has dull and worse at night.  She said it improves in the morning and worsens throughout the day.  She reported aspirin makes it feel better.  She did have a rotator cuff injury to the left shoulder many years ago that was operated on.  Denies any recent injuries to her shoulders.  We discussed that this is most likely arthritic changes and I recommended that she try Voltaren gel along with NSAIDs.  Point-of-care ultrasound showed arthritic changes in the left shoulder and some inflammation of the right shoulder.  She was instructed to come back to clinic if her pain worsens for possible steroid injection.  Plan: - Voltaren gel up to 4 times daily on bilateral shoulders and Tylenol as needed - If pain worsens patient was instructed to return to clinic for possible steroid injection

## 2018-07-31 NOTE — Progress Notes (Deleted)
Hey Dr. Heber Franklin, not sure how that happened! Just sent it! Thanks

## 2018-07-31 NOTE — Progress Notes (Signed)
   CC: bilateral shoulder pain  HPI:  Rebecca Estes is a 84 y.o. female with a PMHx listed below presenting for bilateral shoulder pain. For details of today's visit and the status of his chronic medical issues please refer to the assessment and plan.   Past Medical History:  Diagnosis Date  . Arrhythmia   . Bleeding ulcer   . Cancer ([place])   . Hiatal hernia   . Hypertension   . Hyperthyroidism   . Osteoporosis   . Thoracic ascending aortic aneurysm ([place]) 11/06/2017   75mm diagnosed by echo 10/24/17.   Review of Systems:   Review of Systems  Musculoskeletal: Positive for joint pain and myalgias. Negative for back pain, falls and neck pain.  Neurological: Negative for tingling, tremors, sensory change, focal weakness and weakness.  All other systems reviewed and are negative.    Physical Exam:  Vitals:   07/24/18 0938  BP: (!) 153/81  Pulse: (!) 120  Temp: 98 F (36.7 C)  TempSrc: Oral  Weight: 103 lb 1.6 oz (46.8 kg)  Height: 5\' 2"  (1.575 m)   Physical Exam  Constitutional: She is oriented to person, place, and time and well-developed, well-nourished, and in no distress.  Cardiovascular: Normal rate, regular rhythm and normal heart sounds.  No murmur heard. Pulmonary/Chest: Effort normal and breath sounds normal. No respiratory distress. She has no wheezes.  Abdominal: Soft. Bowel sounds are normal. She exhibits no distension. There is no abdominal tenderness.  Musculoskeletal: Normal range of motion.        General: No tenderness, deformity or edema.  Neurological: She is alert and oriented to person, place, and time.  Skin: Skin is warm and dry.  Psychiatric: Memory, affect and judgment normal.    Assessment & Plan:   See Encounters Tab for problem based charting.  Patient seen with Dr. Angelia Mould

## 2018-08-03 NOTE — Progress Notes (Signed)
Internal Medicine Clinic Attending  I saw and evaluated the patient.  I personally confirmed the key portions of the history and exam documented by Dr. Laural Golden and I reviewed pertinent patient test results.  The assessment, diagnosis, and plan were formulated together and I agree with the documentation in the resident's note.     Also of note, I had the chance to discuss with her the findings of her CT a few months ago re ? Of right silent sinus syndrome.  She has no current sinus symptoms no diplopia, no tendneress of sinuses on exam.  EOMI and no changes of appearance of eyes.  Discussed possible referral to ENT for eval but given she is asymptomatic and this was a possible incidental finding she would rather just follow.

## 2018-09-19 ENCOUNTER — Encounter (HOSPITAL_COMMUNITY): Payer: Self-pay | Admitting: Emergency Medicine

## 2018-09-19 ENCOUNTER — Ambulatory Visit (HOSPITAL_COMMUNITY)
Admission: EM | Admit: 2018-09-19 | Discharge: 2018-09-19 | Disposition: A | Discharge status: Home / Self Care | Payer: Medicare HMO | Attending: Internal Medicine | Admitting: Internal Medicine

## 2018-09-19 DIAGNOSIS — B9789 Other viral agents as the cause of diseases classified elsewhere: Secondary | ICD-10-CM

## 2018-09-19 DIAGNOSIS — J069 Acute upper respiratory infection, unspecified: Secondary | ICD-10-CM

## 2018-09-19 MED ORDER — DEXTROMETHORPHAN HBR 10 MG/15ML PO SYRP: 10.0000 mL | ORAL_SOLUTION | ORAL | 0 refills | Status: DC | PRN

## 2018-09-19 NOTE — ED Triage Notes (Signed)
Pt c/o cough x3 days, states "my eyes are running my nose is running and I dont like this cough".

## 2018-09-19 NOTE — ED Provider Notes (Signed)
[facility]    CSN: [id number] Arrival date & time: 09/19/18  1016     History   Chief Complaint Chief Complaint  Patient presents with  . Cough    HPI Rebecca Estes is a 85 y.o. female with a history of hypertension, controlled and hyperlipidemia on statin therapy comes to the urgent care department on account of cough  of 3 days duration.  Cough is nonproductive.  Patient has tried some home remedies with no relief.  No fever or chills.  No chest pain or chest pressure.  No wheezing.  It is associated with runny nose and watering eyes.  Patient denies any change in appetite.  No nausea, vomiting or diarrhea.  HPI  Past Medical History:  Diagnosis Date  . Arrhythmia   . Bleeding ulcer   . Cancer ([place])   . Hiatal hernia   . Hypertension   . Hyperthyroidism   . Osteoporosis   . Thoracic ascending aortic aneurysm ([place]) 11/06/2017   23mm diagnosed by echo 10/24/17.    Patient Active Problem List   Diagnosis Date Noted  . Aortic atherosclerosis ([place]) 07/24/2018  . Chronic pain of both shoulders 07/24/2018  . Leg fatigue 04/24/2018  . Body mass index (BMI) 19.9 or less, adult 02/01/2018  . Difficulty chewing due to dentures 02/01/2018  . Thoracic ascending aortic aneurysm ([place]) 11/06/2017  . Palpitations 10/05/2017  . Seasonal allergies 09/18/2017  . Healthcare maintenance 09/18/2017  . Essential hypertension 05/26/2016  . Osteoporosis 05/26/2016  . Subclinical hyperthyroidism 05/26/2016    Past Surgical History:  Procedure Laterality Date  . ANTERIOR CERVICAL DECOMP/DISCECTOMY FUSION     x3  . BREAST BIOPSY Left 02/10/2009  . NECK SURGERY    . ROTATOR CUFF REPAIR     Left  . TUBAL LIGATION    . UMBILICAL HERNIA REPAIR      OB History   No obstetric history on file.      Home Medications    Prior to Admission medications   Medication Sig Start Date End Date Taking? Authorizing Provider  alendronate (FOSAMAX) 70 MG tablet Take 1 tablet  (70 mg total) by mouth once a week. Take with a full glass of water on an empty stomach. 04/24/18   Alphonzo Grieve, MD  amLODipine (NORVASC) 10 MG tablet Take 1 tablet (10 mg total) by mouth daily. 07/24/18   Rehman, Areeg N, DO  aspirin EC 325 MG tablet Take 325 mg by mouth every morning.     [provider]  cetirizine (ZYRTEC) 5 MG tablet Take 1 tablet (5 mg total) by mouth daily. 05/10/18   Loura Halt A, NP  Cholecalciferol (VITAMIN D3) 1000 units CAPS Vitamin D3 1,000 unit capsule  Take 1 capsule every day by oral route.    [provider]  diclofenac sodium (VOLTAREN) 1 % GEL Apply 2 g topically 4 (four) times daily. 07/24/18   Rehman, Areeg N, DO  fluticasone (FLONASE) 50 MCG/ACT nasal spray Place 1 spray into both nostrils daily. 05/10/18   Loura Halt A, NP  metoprolol succinate (TOPROL XL) 50 MG 24 hr tablet Take 1 tablet (50 mg total) by mouth daily. Take with or immediately following a meal. 07/24/18 07/24/19  Rehman, Areeg N, DO  polyethylene glycol (MIRALAX / GLYCOLAX) packet Take 17 g by mouth daily. 09/18/17   Jule Ser, DO  pravastatin (PRAVACHOL) 40 MG tablet Take 1 tablet (40 mg total) by mouth every evening. 11/30/17 11/30/18  Lucious Groves,  DO    Family History Family History  Problem Relation Age of Onset  . Aneurysm Father 89       Brain  . Other Mother 58       Blood Infection  . Coronary artery disease Brother 64  . Stroke Brother 72  . Aneurysm Sister 65       Brain  . Cervical cancer Sister 25  . Kidney failure Sister 5       was on Dialysis  . Aneurysm Sister        Brain    Social History Social History   Tobacco Use  . Smoking status: Never Smoker  . Smokeless tobacco: Never Used  Substance Use Topics  . Alcohol use: No  . Drug use: No     Allergies   Methocarbamol   Review of Systems Review of Systems  Constitutional: Negative for activity change, appetite change and fever.  HENT: Positive for congestion. Negative  for ear pain, mouth sores, postnasal drip, sinus pressure, sneezing, sore throat and trouble swallowing.   Respiratory: Negative for chest tightness, shortness of breath and wheezing.      Physical Exam Triage Vital Signs ED Triage Vitals  Enc Vitals Group     BP 09/19/18 1102 (!) 144/74     Pulse Rate 09/19/18 1102 60     Resp 09/19/18 1102 18     Temp 09/19/18 1102 98.2 F (36.8 C)     Temp src --      SpO2 09/19/18 1102 100 %     Weight --      Height --      Head Circumference --      Peak Flow --      Pain Score 09/19/18 1103 0     Pain Loc --      Pain Edu? --      Excl. in [place]? --    No data found.  Updated Vital Signs BP (!) 144/74   Pulse 60   Temp 98.2 F (36.8 C)   Resp 18   SpO2 100%   Visual Acuity Right Eye Distance:   Left Eye Distance:   Bilateral Distance:    Right Eye Near:   Left Eye Near:    Bilateral Near:     Physical Exam Constitutional:      General: She is not in acute distress.    Appearance: She is not ill-appearing.  HENT:     Right Ear: Tympanic membrane normal.     Left Ear: Tympanic membrane normal.     Nose: Nose normal. No rhinorrhea.     Mouth/Throat:     Mouth: Mucous membranes are moist.     Pharynx: No oropharyngeal exudate or posterior oropharyngeal erythema.  Eyes:     General:        Right eye: No discharge.        Left eye: No discharge.     Extraocular Movements: Extraocular movements intact.     Conjunctiva/sclera: Conjunctivae normal.  Pulmonary:     Effort: Pulmonary effort is normal. No respiratory distress.     Breath sounds: No wheezing or rhonchi.  Abdominal:     General: There is no distension.     Palpations: There is no mass.  Lymphadenopathy:     Cervical: No cervical adenopathy.  Neurological:     Mental Status: She is alert.      UC Treatments / Results  Labs (all labs ordered are listed, but  only abnormal results are displayed) Labs Reviewed - No data to  display  EKG None  Radiology No results found.  Procedures Procedures (including critical care time)  Medications Ordered in UC Medications - No data to display  Initial Impression / Assessment and Plan / UC Course  I have reviewed the triage vital signs and the nursing notes.  Pertinent labs & imaging results that were available during my care of the patient were reviewed by me and considered in my medical decision making (see chart for details).     1.  Acute viral nasopharyngitis: Tylenol/NSAIDs for pain/fever Continue oral fluid intake  2.  Hypertension, suboptimally controlled: Continue current antihypertensive medication regimen  3.  Hyperlipidemia-controlled on pravastatin. Final Clinical Impressions(s) / UC Diagnoses   Final diagnoses:  None   Discharge Instructions   None    ED Prescriptions    None     Controlled Substance Prescriptions [place] Controlled Substance Registry consulted? No   Chase Picket, MD 09/20/18 [phone]

## 2018-10-10 ENCOUNTER — Ambulatory Visit (INDEPENDENT_AMBULATORY_CARE_PROVIDER_SITE_OTHER): Payer: Medicare HMO | Admitting: Internal Medicine

## 2018-10-10 ENCOUNTER — Other Ambulatory Visit: Payer: Self-pay

## 2018-10-10 ENCOUNTER — Encounter: Payer: Self-pay | Admitting: Internal Medicine

## 2018-10-10 VITALS — BP 147/78 | HR 59 | Temp 97.6°F | Ht 62.0 in | Wt 103.0 lb

## 2018-10-10 DIAGNOSIS — M19011 Primary osteoarthritis, right shoulder: Secondary | ICD-10-CM

## 2018-10-10 DIAGNOSIS — G8929 Other chronic pain: Secondary | ICD-10-CM | POA: Diagnosis not present

## 2018-10-10 DIAGNOSIS — R05 Cough: Secondary | ICD-10-CM | POA: Diagnosis not present

## 2018-10-10 DIAGNOSIS — M19012 Primary osteoarthritis, left shoulder: Secondary | ICD-10-CM

## 2018-10-10 DIAGNOSIS — M25512 Pain in left shoulder: Principal | ICD-10-CM

## 2018-10-10 DIAGNOSIS — K439 Ventral hernia without obstruction or gangrene: Secondary | ICD-10-CM

## 2018-10-10 DIAGNOSIS — M25511 Pain in right shoulder: Principal | ICD-10-CM

## 2018-10-10 DIAGNOSIS — R059 Cough, unspecified: Secondary | ICD-10-CM

## 2018-10-10 NOTE — Progress Notes (Signed)
   CC: left arm pain, cough and abdominal hernia  HPI:  [name] is a 85 y.o. female with history noted below that presents to the acute care clinic for follow-up on left arm pain, cough and abdominal hernia. Please see problem based charting for the status of patient's chronic medical conditions.  Past Medical History:  Diagnosis Date  . Arrhythmia   . Bleeding ulcer   . Cancer ([place])   . Hiatal hernia   . Hypertension   . Hyperthyroidism   . Osteoporosis   . Thoracic ascending aortic aneurysm ([place]) 11/06/2017   77mm diagnosed by echo 10/24/17.    Review of Systems:  Review of Systems  Constitutional: Negative for chills and fever.  Respiratory: Positive for cough. Negative for sputum production and shortness of breath.   Cardiovascular: Negative for chest pain.     Physical Exam:  Vitals:   10/10/18 0911  BP: (!) 147/78  Pulse: (!) 59  Temp: 97.6 F (36.4 C)  TempSrc: Oral  SpO2: 100%  Weight: 103 lb (46.7 kg)  Height: 5\' 2"  (1.575 m)   Physical Exam  Constitutional: She is well-developed, well-nourished, and in no distress.  Cardiovascular: Normal rate, regular rhythm and normal heart sounds. Exam reveals no gallop and no friction rub.  No murmur heard. Pulmonary/Chest: Effort normal and breath sounds normal. No respiratory distress. She has no wheezes. She has no rales.  Abdominal: Soft. She exhibits no distension and no mass. There is no abdominal tenderness. There is no rebound and no guarding.  Musculoskeletal:        General: No edema.     Comments: Decreased range of motion on abduction of the left shoulder  Skin: Skin is warm and dry. No erythema.     Assessment & Plan:   See encounters tab for problem based medical decision making.   Patient discussed with Dr. Angelia Mould

## 2018-10-10 NOTE — Patient Instructions (Signed)
Rebecca Estes,  It is a pleasure meeting you today. A referral has been made to surgery to assess your hernia.  Medication has been sent to your pharmacy to help with her cough.  Please call the clinic if your symptoms worsen.

## 2018-10-12 DIAGNOSIS — K439 Ventral hernia without obstruction or gangrene: Secondary | ICD-10-CM | POA: Insufficient documentation

## 2018-10-12 DIAGNOSIS — R05 Cough: Secondary | ICD-10-CM | POA: Insufficient documentation

## 2018-10-12 DIAGNOSIS — R059 Cough, unspecified: Secondary | ICD-10-CM | POA: Insufficient documentation

## 2018-10-12 MED ORDER — BENZONATATE 100 MG PO CAPS: 100.0000 mg | ORAL_CAPSULE | Freq: Three times a day (TID) | ORAL | 0 refills | Status: DC | PRN

## 2018-10-12 NOTE — Assessment & Plan Note (Signed)
History of present illness:   Patient was seen at urgent care on 09/19/2018 for nonproductive cough. Vitals were stable and she was afebrile at that time.  She was treated for acute viral nasopharyngitis with Tylenol.  She states she currently takes over-the-counter cough medication for her symptoms. She denies sputum production, fever/chills or shortness of breath.  She states she occasionally has a cough that has been nagging for the past month.  Assessment: Persistent cough Patient appears well on exam. Lungs are clear. Her cough is improving and occasionally happens. This is likely from her viral URI a few weeks earlier. At this time will prescribe Tessalon Perles.  Plan -Tessalon Perles

## 2018-10-12 NOTE — Assessment & Plan Note (Signed)
History of present illness:  Patient states that she continues to have left arm pain that is worse when she lays on it at night and moves in her bed. She was seen in the acute care clinic in December 2019 and prescribed Voltaren gel. She states she has tried it with little benefit. She mentioned that she was offered a steroid injection and would like that today.  Assessment: Osteoarthritis of left shoulder Patient may be having referred pain from her osteoarthritis. The pain is located to the middle of the arm with no acute findings noted on exam. Patient has decreased of motion on abduction of her left arm. At this time will try a steroid injection to see if this will help with her symptoms.  Plan -Steroid injection of left shoulder  PROCEDURE NOTE  PROCEDURE: left shoulder joint steroid injection.  PREOPERATIVE DIAGNOSIS: osteoarthritis of left shoulder.  POSTOPERATIVE DIAGNOSIS: osteoarthritis of left shoulder.  PROCEDURE: The patient was apprised of the risks and the benefits of the procedure and informed consent was obtained, as witnessed by Dr. Joni Reining. Time-out procedure was performed, with confirmation of the patient's name, date of birth, and correct identification of the left shoulder to be injected. The patient's shoulder was then marked at the appropriate site for injection placement. The shoulder was sterilely prepped with Betadine. A 1 mg solution of Kenalog was drawn up into a 3 mL syringe with a 2 mL of 1% lidocaine. The patient was injected with a 25 gauge needle at the lateral  aspect of her  left shoulder. There were no complications. The patient tolerated the procedure well. There was minimal bleeding. The patient was instructed to ice her shoulder upon leaving clinic and refrain from overuse over the next 3 days. The patient was instructed to go to the emergency room with any usual pain, swelling, or redness occurred in the injected area. The patient was given a followup  appointment to evaluate response to the injection to his increased range of motion and reduction of pain.  The procedure was supervised by attending physician, Dr. Angelia Mould.

## 2018-10-12 NOTE — Assessment & Plan Note (Signed)
History of present illness: Patient states she had abdominal hernia repair over 30 years ago. She states that occasionally her hernia protrudes and states out for periods of time and this elicits pain. She is currently not having those symptoms. She would like referral to general surgery for further evaluation.  Assessment: Abdominal hernia On exam hernia was not appreciated.   Plan -Gen. Surgery referral

## 2018-10-15 ENCOUNTER — Telehealth: Payer: Self-pay | Admitting: *Deleted

## 2018-10-15 NOTE — Telephone Encounter (Signed)
UNABLE TO GET IN CONTACT WITH PATIENT. HER PHONE LINE IS STAYING CONSTANTLY BUSY.. WILL ATTEMPT TO CONTACT PATIENT TOMORROW.

## 2018-10-15 NOTE — Progress Notes (Signed)
Internal Medicine Clinic Attending  I saw and evaluated the patient.  I personally confirmed the key portions of the history and exam documented by Dr. Heber [place] and I reviewed pertinent patient test results.  The assessment, diagnosis, and plan were formulated together and I agree with the documentation in the resident's note.   I was present for the entire procedure.

## 2018-10-16 ENCOUNTER — Telehealth: Payer: Self-pay | Admitting: *Deleted

## 2018-10-16 NOTE — Telephone Encounter (Signed)
STILL UNABLE TO CONTACT PATIENT BY PHONE. LINE STILL CONTINUE TO STAY BUST\Y. UNABLE TO LEAVE MESSAGE. DAUGHTER'S PHONE NUMBER IS NOT IN SERVICE.

## 2018-10-17 ENCOUNTER — Encounter: Payer: Self-pay | Admitting: *Deleted

## 2018-10-17 ENCOUNTER — Telehealth: Payer: Self-pay | Admitting: *Deleted

## 2018-10-17 NOTE — Telephone Encounter (Signed)
Still unable to get in contact with patient by phone.  Phone line continue to stay busy.  Daughter's phone not in service. Sending letter.

## 2018-11-19 NOTE — Addendum Note (Signed)
Addended by: Hulan Fray on: 11/19/2018 03:44 PM   Modules accepted: Orders

## 2018-11-27 ENCOUNTER — Telehealth: Payer: Self-pay | Admitting: *Deleted

## 2018-11-27 DIAGNOSIS — K439 Ventral hernia without obstruction or gangrene: Secondary | ICD-10-CM

## 2018-11-27 NOTE — Telephone Encounter (Signed)
Dr. Belia Heman place the surgical referral and chose the [facility] office when she entered the referral. I eventually was able to clarify with Dr. Heber [place], she did not necessarily need referral to go to [facility] and we could not reach pt by phone. I sent pt a letter to contact our office. Apparently she just walked in. We will now focus on sending her referral to [facility], which will need to be authorized by [place] for her insurance and then scheduled per [facility] COVID restriction scheduling criteria. Yvonna Alanis, RN, 11/27/18, 4:16P

## 2018-11-27 NOTE — Telephone Encounter (Signed)
I am unclear from the last referral what exactly happened when she was referred. It seems Epic took the referral over to [place], I have placed a new referral can we see that it gets to [facility] [place] [facility].

## 2018-11-27 NOTE — Telephone Encounter (Signed)
Pt walked in today, she states she wants to have [facility]nia surgery [facility], she states she was told she just needs to inform her pcp and the pcp office will arrange everything. Informing lela s., dr Heber [place], sharon p.

## 2018-11-29 ENCOUNTER — Telehealth: Payer: Self-pay | Admitting: *Deleted

## 2018-11-29 ENCOUNTER — Other Ambulatory Visit: Payer: Self-pay | Admitting: Internal Medicine

## 2018-11-29 DIAGNOSIS — M25511 Pain in right shoulder: Principal | ICD-10-CM

## 2018-11-29 DIAGNOSIS — M25512 Pain in left shoulder: Principal | ICD-10-CM

## 2018-11-29 DIAGNOSIS — G8929 Other chronic pain: Secondary | ICD-10-CM

## 2018-11-29 NOTE — Telephone Encounter (Signed)
done

## 2018-12-04 ENCOUNTER — Other Ambulatory Visit: Payer: Self-pay | Admitting: *Deleted

## 2018-12-04 MED ORDER — PRAVASTATIN SODIUM 40 MG PO TABS: 40.0000 mg | ORAL_TABLET | Freq: Every evening | ORAL | 3 refills | Status: DC

## 2018-12-04 NOTE — Telephone Encounter (Signed)
Spoke with the patient.  Appt sch for 12/27/2018 with PCP.

## 2018-12-10 DIAGNOSIS — K439 Ventral hernia without obstruction or gangrene: Secondary | ICD-10-CM | POA: Diagnosis not present

## 2018-12-27 ENCOUNTER — Encounter: Payer: Self-pay | Admitting: Internal Medicine

## 2018-12-27 ENCOUNTER — Ambulatory Visit (INDEPENDENT_AMBULATORY_CARE_PROVIDER_SITE_OTHER): Payer: Medicare HMO | Admitting: Internal Medicine

## 2018-12-27 ENCOUNTER — Other Ambulatory Visit: Payer: Self-pay

## 2018-12-27 VITALS — BP 135/79 | HR 60 | Temp 98.2°F | Ht 65.0 in | Wt 107.3 lb

## 2018-12-27 DIAGNOSIS — K439 Ventral hernia without obstruction or gangrene: Secondary | ICD-10-CM

## 2018-12-27 DIAGNOSIS — M810 Age-related osteoporosis without current pathological fracture: Secondary | ICD-10-CM | POA: Diagnosis not present

## 2018-12-27 DIAGNOSIS — Z7983 Long term (current) use of bisphosphonates: Secondary | ICD-10-CM

## 2018-12-27 DIAGNOSIS — I10 Essential (primary) hypertension: Secondary | ICD-10-CM | POA: Diagnosis not present

## 2018-12-27 DIAGNOSIS — E0590 Thyrotoxicosis, unspecified without thyrotoxic crisis or storm: Secondary | ICD-10-CM

## 2018-12-27 DIAGNOSIS — R7989 Other specified abnormal findings of blood chemistry: Secondary | ICD-10-CM | POA: Diagnosis not present

## 2018-12-27 DIAGNOSIS — R252 Cramp and spasm: Secondary | ICD-10-CM | POA: Diagnosis not present

## 2018-12-27 DIAGNOSIS — Z79899 Other long term (current) drug therapy: Secondary | ICD-10-CM | POA: Diagnosis not present

## 2018-12-27 NOTE — Assessment & Plan Note (Signed)
HPI: She continues to take Fosamax once weekly.  Assessment osteoporosis  Plan Recheck vitamin D level.  It looks like she has been on Fosamax since at least 2017.  I discussed that normally after about 5-year interval we can consider a drug holiday.  It is been 3 years since her last DEXA scan.  In the next 6 months to a year out like to repeat a DEXA reasonable to postpone for now due to the coronavirus pandemic.

## 2018-12-27 NOTE — Progress Notes (Signed)
  Subjective:  HPI: [name] is a 85 y.o. female who presents for f/u HTN  Please see Assessment and Plan below for the status of her chronic medical problems.  Review of Systems: Review of Systems  Constitutional: Negative for fever, malaise/fatigue and weight loss.  Cardiovascular: Negative for chest pain.  Gastrointestinal: Negative for abdominal pain.  Genitourinary: Negative for dysuria.  Neurological: Negative for dizziness.    Objective:  Physical Exam: Vitals:   12/27/18 0943  BP: 135/79  Pulse: 60  Temp: 98.2 F (36.8 C)  SpO2: 100%  Weight: 107 lb 4.8 oz (48.7 kg)  Height: 5\' 5"  (1.651 m)   Body mass index is 17.86 kg/m. Physical Exam Constitutional:      Appearance: She is not ill-appearing.     Comments: thin  Cardiovascular:     Rate and Rhythm: Normal rate and regular rhythm.  Abdominal:     General: Abdomen is flat. There is no distension.     Palpations: Abdomen is soft.     Hernia: No hernia is present.  Musculoskeletal:     Right lower leg: No edema.     Left lower leg: No edema.    Assessment & Plan:  Essential hypertension HPI: brings pill bottles has metoprolol tartrate 50mg  (taking once daily) and amlodipine 10mg  daily.  No side effects reported. No syncope, or near syncope.  A: essential HTN, at goal  P: Continue metoprolol xl 50mg  daily  Continue  amlodipine 10mg  daily  Subclinical hyperthyroidism Repeat TSH and Free T4  Osteoporosis HPI: She continues to take Fosamax once weekly.  Assessment osteoporosis  Plan Recheck vitamin D level.  It looks like she has been on Fosamax since at least 2017.  I discussed that normally after about 5-year interval we can consider a drug holiday.  It is been 3 years since her last DEXA scan.  In the next 6 months to a year out like to repeat a DEXA reasonable to postpone for now due to the coronavirus pandemic.  Muscle cramp, nocturnal She notes occasional cramping of her muscles.   Sometimes it is her left arm sometimes it is her legs acutely worse at night and improved with movement.  Assessment muscle cramping  Plan recheck electrolytes, vitamin D, TSH.  Hernia of abdominal wall She was seen by general surgery.  She reports that more recently she is not been bothered by her hernia.  She reports for the most part is easily reducible when it does protrude.  She feels comforted that the surgeon told her that if it becomes painful on cannot be reduced she can give him a call and be seen quickly.  Assessment hernia  Plan stable continue to monitor patient following with general surgery if symptomatic.   Medications Ordered No orders of the defined types were placed in this encounter.  Other Orders Orders Placed This Encounter  Procedures  . Vitamin D (25 hydroxy)  . CMP14 + Anion Gap  . Lipid Profile  . CBC with Diff  . Ferritin  . Magnesium  . TSH  . T4, Free   Follow Up: Return 4-6 months.

## 2018-12-27 NOTE — Assessment & Plan Note (Signed)
She notes occasional cramping of her muscles.  Sometimes it is her left arm sometimes it is her legs acutely worse at night and improved with movement.  Assessment muscle cramping  Plan recheck electrolytes, vitamin D, TSH.

## 2018-12-27 NOTE — Assessment & Plan Note (Signed)
HPI: brings pill bottles has metoprolol tartrate 50mg  (taking once daily) and amlodipine 10mg  daily.  No side effects reported. No syncope, or near syncope.  A: essential HTN, at goal  P: Continue metoprolol xl 50mg  daily  Continue  amlodipine 10mg  daily

## 2018-12-27 NOTE — Assessment & Plan Note (Signed)
Repeat TSH and Free T4

## 2018-12-27 NOTE — Assessment & Plan Note (Signed)
She was seen by general surgery.  She reports that more recently she is not been bothered by her hernia.  She reports for the most part is easily reducible when it does protrude.  She feels comforted that the surgeon told her that if it becomes painful on cannot be reduced she can give him a call and be seen quickly.  Assessment hernia  Plan stable continue to monitor patient following with general surgery if symptomatic.

## 2018-12-28 LAB — CMP14 + ANION GAP
ALT: 20 IU/L (ref 0–32)
AST: 20 IU/L (ref 0–40)
Albumin/Globulin Ratio: 2 (ref 1.2–2.2)
Albumin: 4.5 g/dL (ref 3.6–4.6)
Alkaline Phosphatase: 59 IU/L (ref 39–117)
Anion Gap: 14 mmol/L (ref 10.0–18.0)
BUN/Creatinine Ratio: 23 (ref 12–28)
BUN: 17 mg/dL (ref 8–27)
Bilirubin Total: 0.3 mg/dL (ref 0.0–1.2)
CO2: 24 mmol/L (ref 20–29)
Calcium: 10 mg/dL (ref 8.7–10.3)
Chloride: 103 mmol/L (ref 96–106)
Creatinine, Ser: 0.74 mg/dL (ref 0.57–1.00)
GFR calc Af Amer: 87 mL/min/{1.73_m2} (ref >59)
GFR calc non Af Amer: 75 mL/min/{1.73_m2} (ref >59)
Globulin, Total: 2.3 g/dL (ref 1.5–4.5)
Glucose: 89 mg/dL (ref 65–99)
Potassium: 4.7 mmol/L (ref 3.5–5.2)
Sodium: 141 mmol/L (ref 134–144)
Total Protein: 6.8 g/dL (ref 6.0–8.5)

## 2018-12-28 LAB — CBC WITH DIFFERENTIAL/PLATELET
Basophils Absolute: 0 10*3/uL (ref 0.0–0.2)
Basos: 0 %
EOS (ABSOLUTE): 0 10*3/uL (ref 0.0–0.4)
Eos: 0 %
Hematocrit: 34.4 % (ref 34.0–46.6)
Hemoglobin: 12.1 g/dL (ref 11.1–15.9)
Immature Grans (Abs): 0 10*3/uL (ref 0.0–0.1)
Immature Granulocytes: 0 %
Lymphocytes Absolute: 1.8 10*3/uL (ref 0.7–3.1)
Lymphs: 39 %
MCH: 30.9 pg (ref 26.6–33.0)
MCHC: 35.2 g/dL (ref 31.5–35.7)
MCV: 88 fL (ref 79–97)
Monocytes Absolute: 0.4 10*3/uL (ref 0.1–0.9)
Monocytes: 9 %
Neutrophils Absolute: 2.2 10*3/uL (ref 1.4–7.0)
Neutrophils: 52 %
Platelets: 223 10*3/uL (ref 150–450)
RBC: 3.91 x10E6/uL (ref 3.77–5.28)
RDW: 13.2 % (ref 11.7–15.4)
WBC: 4.5 10*3/uL (ref 3.4–10.8)

## 2018-12-28 LAB — LIPID PANEL
Chol/HDL Ratio: 2.1 ratio (ref 0.0–4.4)
Cholesterol, Total: 219 mg/dL — ABNORMAL HIGH (ref 100–199)
HDL: 102 mg/dL (ref >39)
LDL Calculated: 105 mg/dL — ABNORMAL HIGH (ref 0–99)
Triglycerides: 58 mg/dL (ref 0–149)
VLDL Cholesterol Cal: 12 mg/dL (ref 5–40)

## 2018-12-28 LAB — T4, FREE: Free T4: 1.17 ng/dL (ref 0.82–1.77)

## 2018-12-28 LAB — TSH: TSH: 0.127 u[IU]/mL — ABNORMAL LOW (ref 0.450–4.500)

## 2018-12-28 LAB — MAGNESIUM: Magnesium: 2.1 mg/dL (ref 1.6–2.3)

## 2018-12-28 LAB — FERRITIN: Ferritin: 361 ng/mL — ABNORMAL HIGH (ref 15–150)

## 2018-12-28 LAB — VITAMIN D 25 HYDROXY (VIT D DEFICIENCY, FRACTURES): Vit D, 25-Hydroxy: 47.2 ng/mL (ref 30.0–100.0)

## 2019-02-08 ENCOUNTER — Other Ambulatory Visit: Payer: Self-pay

## 2019-02-08 ENCOUNTER — Encounter (HOSPITAL_COMMUNITY): Payer: Self-pay | Admitting: Emergency Medicine

## 2019-02-08 ENCOUNTER — Ambulatory Visit (HOSPITAL_COMMUNITY)
Admission: EM | Admit: 2019-02-08 | Discharge: 2019-02-08 | Disposition: A | Discharge status: Home / Self Care | Payer: Medicare HMO | Attending: Family Medicine | Admitting: Family Medicine

## 2019-02-08 DIAGNOSIS — M546 Pain in thoracic spine: Secondary | ICD-10-CM

## 2019-02-08 MED ORDER — KETOROLAC TROMETHAMINE 30 MG/ML IJ SOLN
30.0000 mg | Freq: Once | INTRAMUSCULAR | Status: AC
  Administered 2019-02-08: 13:00:00 30 mg via INTRAMUSCULAR

## 2019-02-08 MED ORDER — KETOROLAC TROMETHAMINE 30 MG/ML IJ SOLN
INTRAMUSCULAR | Status: AC
  Filled 2019-02-08: qty 1

## 2019-02-08 NOTE — ED Provider Notes (Signed)
[facility]    CSN: [id number] Arrival date & time: 02/08/19  1100     History   Chief Complaint Chief Complaint  Patient presents with  . Back Pain  . Cough    HPI Rebecca Estes is a 85 y.o. female with history of scoliosis, osteoporosis, hypertension presenting for acute concern of left-sided back pain.  Patient states that she noticed this worse yesterday morning, thought that she slept on it weird.  Patient states that she notices her dull ache more at night.  Patient denies radiation of pain, chest pain, shortness of breath.  Patient states certain movements with her left arm will exacerbate this pain.  Has tried ibuprofen OTC without much relief.  Of note, patient is currently routinely active, going for 2 mile walks daily.  Patient denies shortness of chest pain or dyspnea during activity, despite back pain.  Patient denies fall or trauma to the area.    Past Medical History:  Diagnosis Date  . Arrhythmia   . Bleeding ulcer   . Cancer ([place])   . Hiatal hernia   . Hypertension   . Hyperthyroidism   . Osteoporosis   . Thoracic ascending aortic aneurysm ([place]) 11/06/2017   69mm diagnosed by echo 10/24/17.    Patient Active Problem List   Diagnosis Date Noted  . Muscle cramp, nocturnal 12/27/2018  . Cough 10/12/2018  . Hernia of abdominal wall 10/12/2018  . Aortic atherosclerosis ([place]) 07/24/2018  . Chronic pain of both shoulders 07/24/2018  . Leg fatigue 04/24/2018  . Body mass index (BMI) 19.9 or less, adult 02/01/2018  . Difficulty chewing due to dentures 02/01/2018  . Thoracic ascending aortic aneurysm ([place]) 11/06/2017  . Palpitations 10/05/2017  . Seasonal allergies 09/18/2017  . Healthcare maintenance 09/18/2017  . Essential hypertension 05/26/2016  . Osteoporosis 05/26/2016  . Subclinical hyperthyroidism 05/26/2016    Past Surgical History:  Procedure Laterality Date  . ANTERIOR CERVICAL DECOMP/DISCECTOMY FUSION     x3  . BREAST BIOPSY  Left 02/10/2009  . NECK SURGERY    . ROTATOR CUFF REPAIR     Left  . TUBAL LIGATION    . UMBILICAL HERNIA REPAIR      OB History   No obstetric history on file.      Home Medications    Prior to Admission medications   Medication Sig Start Date End Date Taking? Authorizing Provider  alendronate (FOSAMAX) 70 MG tablet Take 1 tablet (70 mg total) by mouth once a week. Take with a full glass of water on an empty stomach. 04/24/18   Alphonzo Grieve, MD  amLODipine (NORVASC) 10 MG tablet Take 1 tablet (10 mg total) by mouth daily. 07/24/18   Rehman, Areeg N, DO  aspirin EC 325 MG tablet Take 325 mg by mouth every morning.     [provider]  benzonatate (TESSALON) 100 MG capsule TAKE 1 CAPSULE 3 TIMES DAILY AS NEEDED FOR COUGH. 11/29/18   Lucious Groves, DO  cetirizine (ZYRTEC) 5 MG tablet Take 1 tablet (5 mg total) by mouth daily. 05/10/18   Loura Halt A, NP  Cholecalciferol (VITAMIN D3) 1000 units CAPS Vitamin D3 1,000 unit capsule  Take 1 capsule every day by oral route.    [provider]  Dextromethorphan HBr 10 MG/15ML SYRP Take 10 mLs (6.6667 mg total) by mouth every 4 (four) hours as needed. 09/19/18   Lamptey, Myrene Galas, MD  diclofenac sodium (VOLTAREN) 1 % GEL APPLY 2 GRAMS TOPICALLY FOUR  TIMES DAILY 11/29/18   Lucious Groves, DO  docusate sodium (COLACE) 100 MG capsule Take 100 mg by mouth 2 (two) times daily.    [provider]  fluticasone (FLONASE) 50 MCG/ACT nasal spray Place 1 spray into both nostrils daily. 05/10/18   Loura Halt A, NP  metoprolol succinate (TOPROL XL) 50 MG 24 hr tablet Take 1 tablet (50 mg total) by mouth daily. Take with or immediately following a meal. 07/24/18 07/24/19  Rehman, Areeg N, DO  polyethylene glycol (MIRALAX / GLYCOLAX) packet Take 17 g by mouth daily. Patient not taking: Reported on 12/27/2018 09/18/17   Jule Ser, DO  pravastatin (PRAVACHOL) 40 MG tablet Take 1 tablet (40 mg total) by mouth every evening.  12/04/18 12/04/19  Lucious Groves, DO    Family History Family History  Problem Relation Age of Onset  . Aneurysm Father 73       Brain  . Other Mother 1       Blood Infection  . Coronary artery disease Brother 46  . Stroke Brother 4  . Aneurysm Sister 35       Brain  . Cervical cancer Sister 71  . Kidney failure Sister 55       was on Dialysis  . Aneurysm Sister        Brain    Social History Social History   Tobacco Use  . Smoking status: Never Smoker  . Smokeless tobacco: Never Used  Substance Use Topics  . Alcohol use: No  . Drug use: No     Allergies   Methocarbamol   Review of Systems As per HPI   Physical Exam Triage Vital Signs ED Triage Vitals [02/08/19 1143]  Enc Vitals Group     BP (!) 144/92     Pulse Rate 72     Resp 18     Temp 98.4 F (36.9 C)     Temp Source Oral     SpO2 100 %     Weight      Height      Head Circumference      Peak Flow      Pain Score 5     Pain Loc      Pain Edu?      Excl. in [place]?    No data found.  Updated Vital Signs BP (!) 144/92 (BP Location: Right Arm)   Pulse 72   Temp 98.4 F (36.9 C) (Oral)   Resp 18   SpO2 100%   Visual Acuity Right Eye Distance:   Left Eye Distance:   Bilateral Distance:    Right Eye Near:   Left Eye Near:    Bilateral Near:     Physical Exam Constitutional:      General: She is not in acute distress. HENT:     Head: Normocephalic and atraumatic.  Eyes:     General: No scleral icterus.    Pupils: Pupils are equal, round, and reactive to light.  Cardiovascular:     Rate and Rhythm: Normal rate.  Pulmonary:     Effort: Pulmonary effort is normal.  Musculoskeletal:     Comments: Left scapula without significant bony abnormality, ecchymosis, edema.  No lesions or bony tenderness to palpation.  Patient does have scoliosis and is tender between left scapula and spine.  No vertebral or paraspinal tenderness.  Full active range of motion of both upper extremities  bilaterally symmetric with 5/5 strength.  Grip strength 5/5 bilaterally.  Neurovascularly intact bilaterally.  Skin:    Coloration: Skin is not jaundiced or pale.  Neurological:     Mental Status: She is alert and oriented to person, place, and time.      UC Treatments / Results  Labs (all labs ordered are listed, but only abnormal results are displayed) Labs Reviewed - No data to display  EKG   Radiology No results found.  Procedures Procedures (including critical care time)  Medications Ordered in UC Medications  ketorolac (TORADOL) 30 MG/ML injection 30 mg (30 mg Intramuscular Given 02/08/19 1230)  ketorolac (TORADOL) 30 MG/ML injection (has no administration in time range)    Initial Impression / Assessment and Plan / UC Course  I have reviewed the triage vital signs and the nursing notes.  Pertinent labs & imaging results that were available during my care of the patient were reviewed by me and considered in my medical decision making (see chart for details).     85 year old female with history of scoliosis, hypertension presenting for left-sided back pain.  Patient's blood pressure is elevated, did not yet take her blood pressure medication and is asymptomatic.  Sickle exam is reassuring, likely musculoskeletal complicated by underlying scoliosis.  Patient given low-dose Toradol in office which he tolerated well.  Patient encouraged to use heating pad/hot compresses in conjunction with OTC NSAIDs or Tylenol for additional relief.  Return precautions discussed, patient verbalized understanding. Final Clinical Impressions(s) / UC Diagnoses   Final diagnoses:  Acute left-sided thoracic back pain     Discharge Instructions     May use heating pad and ibuprofen for additional relief. Return if your pain worsens, you get fever, chest pain, or difficulty breathing.    ED Prescriptions    None     Controlled Substance Prescriptions [place] Controlled Substance Registry  consulted? Not Applicable   Quincy Sheehan, [place] 02/08/19 1744

## 2019-02-08 NOTE — Discharge Instructions (Signed)
May use heating pad and ibuprofen for additional relief. Return if your pain worsens, you get fever, chest pain, or difficulty breathing.

## 2019-02-08 NOTE — ED Triage Notes (Signed)
Pt here with mid back pain and some cough; denies fever

## 2019-02-11 ENCOUNTER — Encounter (HOSPITAL_COMMUNITY): Payer: Self-pay

## 2019-02-11 ENCOUNTER — Emergency Department (HOSPITAL_COMMUNITY): Payer: Medicare HMO

## 2019-02-11 ENCOUNTER — Other Ambulatory Visit: Payer: Self-pay

## 2019-02-11 ENCOUNTER — Emergency Department (HOSPITAL_COMMUNITY)
Admission: EM | Admit: 2019-02-11 | Discharge: 2019-02-11 | Disposition: A | Discharge status: Home / Self Care | Payer: Medicare HMO | Attending: Emergency Medicine | Admitting: Emergency Medicine

## 2019-02-11 DIAGNOSIS — I10 Essential (primary) hypertension: Secondary | ICD-10-CM | POA: Diagnosis not present

## 2019-02-11 DIAGNOSIS — Z7982 Long term (current) use of aspirin: Secondary | ICD-10-CM | POA: Diagnosis not present

## 2019-02-11 DIAGNOSIS — M25512 Pain in left shoulder: Secondary | ICD-10-CM | POA: Diagnosis not present

## 2019-02-11 DIAGNOSIS — Z79899 Other long term (current) drug therapy: Secondary | ICD-10-CM | POA: Insufficient documentation

## 2019-02-11 DIAGNOSIS — M62830 Muscle spasm of back: Secondary | ICD-10-CM | POA: Diagnosis not present

## 2019-02-11 DIAGNOSIS — W19XXXD Unspecified fall, subsequent encounter: Secondary | ICD-10-CM | POA: Insufficient documentation

## 2019-02-11 DIAGNOSIS — S4992XA Unspecified injury of left shoulder and upper arm, initial encounter: Secondary | ICD-10-CM | POA: Diagnosis not present

## 2019-02-11 MED ORDER — LIDOCAINE 5 % EX PTCH: 1.0000 | MEDICATED_PATCH | CUTANEOUS | 0 refills | Status: DC

## 2019-02-11 NOTE — ED Provider Notes (Signed)
[facility] EMERGENCY DEPARTMENT Provider Note   CSN: [id number] Arrival date & time: 02/11/19  1617    History   Chief Complaint Chief Complaint  Patient presents with  . Fall    HPI Rebecca Estes is a 85 y.o. female presented for evaluation after fall.  Patient states 1 week ago she fell, landing on her buttock.  She was evaluated at that time, has a healing contusion of her right buttock.  However, 3 days ago she started develop intermittent muscle spasms over her left shoulder blade.  Spasms are more likely to happen when she bends forward, no association with exertion.  She has been taking Tylenol, ibuprofen, and using heat without improvement of symptoms.  She denies associated shortness of breath, palpitations, diaphoresis, nausea, vomiting.  Patient states every once in a while feels like the pain shoots forward.  He denies numbness or tingling of the left arm.  She denies neck pain.  She has a history of arrhythmia, hypertension, hypothyroidism, thoracic aneurysm.     HPI  Past Medical History:  Diagnosis Date  . Arrhythmia   . Bleeding ulcer   . Cancer ([place])   . Hiatal hernia   . Hypertension   . Hyperthyroidism   . Osteoporosis   . Thoracic ascending aortic aneurysm ([place]) 11/06/2017   6mm diagnosed by echo 10/24/17.    Patient Active Problem List   Diagnosis Date Noted  . Muscle cramp, nocturnal 12/27/2018  . Cough 10/12/2018  . Hernia of abdominal wall 10/12/2018  . Aortic atherosclerosis ([place]) 07/24/2018  . Chronic pain of both shoulders 07/24/2018  . Leg fatigue 04/24/2018  . Body mass index (BMI) 19.9 or less, adult 02/01/2018  . Difficulty chewing due to dentures 02/01/2018  . Thoracic ascending aortic aneurysm ([place]) 11/06/2017  . Palpitations 10/05/2017  . Seasonal allergies 09/18/2017  . Healthcare maintenance 09/18/2017  . Essential hypertension 05/26/2016  . Osteoporosis 05/26/2016  . Subclinical hyperthyroidism 05/26/2016     Past Surgical History:  Procedure Laterality Date  . ANTERIOR CERVICAL DECOMP/DISCECTOMY FUSION     x3  . BREAST BIOPSY Left 02/10/2009  . NECK SURGERY    . ROTATOR CUFF REPAIR     Left  . TUBAL LIGATION    . UMBILICAL HERNIA REPAIR       OB History   No obstetric history on file.      Home Medications    Prior to Admission medications   Medication Sig Start Date End Date Taking? Authorizing Provider  alendronate (FOSAMAX) 70 MG tablet Take 1 tablet (70 mg total) by mouth once a week. Take with a full glass of water on an empty stomach. 04/24/18   Alphonzo Grieve, MD  amLODipine (NORVASC) 10 MG tablet Take 1 tablet (10 mg total) by mouth daily. 07/24/18   Rehman, Areeg N, DO  aspirin EC 325 MG tablet Take 325 mg by mouth every morning.     [provider]  benzonatate (TESSALON) 100 MG capsule TAKE 1 CAPSULE 3 TIMES DAILY AS NEEDED FOR COUGH. 11/29/18   Lucious Groves, DO  cetirizine (ZYRTEC) 5 MG tablet Take 1 tablet (5 mg total) by mouth daily. 05/10/18   Loura Halt A, NP  Cholecalciferol (VITAMIN D3) 1000 units CAPS Vitamin D3 1,000 unit capsule  Take 1 capsule every day by oral route.    [provider]  Dextromethorphan HBr 10 MG/15ML SYRP Take 10 mLs (6.6667 mg total) by mouth every 4 (four) hours as needed.  09/19/18   LampteyMyrene Galas, MD  diclofenac sodium (VOLTAREN) 1 % GEL APPLY 2 GRAMS TOPICALLY FOUR TIMES DAILY 11/29/18   Lucious Groves, DO  docusate sodium (COLACE) 100 MG capsule Take 100 mg by mouth 2 (two) times daily.    [provider]  fluticasone (FLONASE) 50 MCG/ACT nasal spray Place 1 spray into both nostrils daily. 05/10/18   Bast, Tressia Miners A, NP  lidocaine (LIDODERM) 5 % Place 1 patch onto the skin daily. Remove & Discard patch within 12 hours or as directed by MD 02/11/19   Tamir Wallman, PA-C  metoprolol succinate (TOPROL XL) 50 MG 24 hr tablet Take 1 tablet (50 mg total) by mouth daily. Take with or immediately following a  meal. 07/24/18 07/24/19  Rehman, Areeg N, DO  polyethylene glycol (MIRALAX / GLYCOLAX) packet Take 17 g by mouth daily. Patient not taking: Reported on 12/27/2018 09/18/17   Jule Ser, DO  pravastatin (PRAVACHOL) 40 MG tablet Take 1 tablet (40 mg total) by mouth every evening. 12/04/18 12/04/19  Lucious Groves, DO    Family History Family History  Problem Relation Age of Onset  . Aneurysm Father 86       Brain  . Other Mother 69       Blood Infection  . Coronary artery disease Brother 55  . Stroke Brother 51  . Aneurysm Sister 66       Brain  . Cervical cancer Sister 54  . Kidney failure Sister 11       was on Dialysis  . Aneurysm Sister        Brain    Social History Social History   Tobacco Use  . Smoking status: Never Smoker  . Smokeless tobacco: Never Used  Substance Use Topics  . Alcohol use: No  . Drug use: No     Allergies   Methocarbamol   Review of Systems Review of Systems  Musculoskeletal: Positive for back pain.  All other systems reviewed and are negative.    Physical Exam Updated Vital Signs BP 110/74 (BP Location: Right Arm)   Pulse 66   Temp 98.6 F (37 C) (Oral)   Resp 18   Ht 5\' 6"  (1.676 m)   Wt 49 kg   SpO2 99%   BMI 17.43 kg/m   Physical Exam Vitals signs and nursing note reviewed.  Constitutional:      General: She is not in acute distress.    Appearance: She is well-developed.     Comments: Appears nontoxic  HENT:     Head: Normocephalic and atraumatic.  Eyes:     Conjunctiva/sclera: Conjunctivae normal.     Pupils: Pupils are equal, round, and reactive to light.  Neck:     Musculoskeletal: Normal range of motion and neck supple.     Comments: No tenderness palpation of the midline C-spine Cardiovascular:     Rate and Rhythm: Normal rate and regular rhythm.     Pulses: Normal pulses.  Pulmonary:     Effort: Pulmonary effort is normal. No respiratory distress.     Breath sounds: Normal breath sounds. No wheezing.      Comments: Speaking in full sentences.  Clear lung sounds in all fields. Abdominal:     General: There is no distension.     Palpations: Abdomen is soft. There is no mass.     Tenderness: There is no abdominal tenderness. There is no guarding or rebound.  Musculoskeletal: Normal range of motion.  Back:     Comments: Discomfort is reproducible with palpation of the left upper back musculature.  No obvious deformities.  Strength of upper extremities intact bilaterally.  Radial pulses intact bilaterally.  Skin:    General: Skin is warm and dry.  Neurological:     Mental Status: She is alert and oriented to person, place, and time.      ED Treatments / Results  Labs (all labs ordered are listed, but only abnormal results are displayed) Labs Reviewed - No data to display  EKG None  Radiology Dg Scapula Left  Result Date: 02/11/2019 CLINICAL DATA:  Fall with 1 week of left scapular pain. EXAM: LEFT SCAPULA - 2+ VIEWS COMPARISON:  None. FINDINGS: No convincing fracture. Located glenohumeral and acromioclavicular joints. Mild degenerative spurring soft tissue mineralization about the AC joint. IMPRESSION: 1. No acute finding. 2. Mild spurring at the [facility] joint. Electronically Signed   By: Monte Fantasia M.D.   On: 02/11/2019 17:50    Procedures Procedures (including critical care time)  Medications Ordered in ED Medications - No data to display   Initial Impression / Assessment and Plan / ED Course  I have reviewed the triage vital signs and the nursing notes.  Pertinent labs & imaging results that were available during my care of the patient were reviewed by me and considered in my medical decision making (see chart for details).        Patient presenting for evaluation of left shoulder pain.  Physical exam shows patient who appears nontoxic.  Discomfort is reproducible with palpation of the musculature.  Likely muscle spasm.  X-rays viewed interpreted by me, no fracture  or dislocation.  Lower suspicion for cardiac cause for patient's symptoms.  Discussed symptomatic treatment with lidocaine patch as well as continued Tylenol and ibuprofen.  Case discussed with attending, Dr. Ralene Bathe agrees to plan. Will have pt f/u with pcp as needed if sxs not improving. At this time, patient appears safe for discharged.  Return precautions given.  Patient states she understands and agrees to plan.   Final Clinical Impressions(s) / ED Diagnoses   Final diagnoses:  Back muscle spasm  Fall, subsequent encounter    ED Discharge Orders         Ordered    lidocaine (LIDODERM) 5 %  Every 24 hours     02/11/19 2134           Franchot Heidelberg, PA-C 02/12/19 Bradd Burner, MD 02/12/19 [phone]

## 2019-02-11 NOTE — ED Notes (Signed)
Husband [facility])-- [phone]   Spouse went home, call his cell phone will updates and discharge

## 2019-02-11 NOTE — Discharge Instructions (Addendum)
Continue taking medications for pain including, ibuprofen. Use lidocaine patches to help with pain. Follow-up with your primary care doctor in 1 week if symptoms not improving. Return to emergency room immediately if you developing chest pain, shortness of breath, your pain is occurring with lightheadedness/sweatiness/weakness, or with any new, worsening, or concerning symptoms.

## 2019-02-11 NOTE — ED Triage Notes (Signed)
Pt arrives POV for eval of fall 3-4 days PTA. Pt reports she was walking and bent over to pick up a dollar, lost her balance and then fell backwards landing on her back. Pt reports L sided subscapular pain as well as R buttock pain. Pt is ambulatory, no c/o pelvic/hip pain, hematoma to R buttock appears to be healing. Pt denies striking head, denies anticoagulation

## 2019-02-15 ENCOUNTER — Ambulatory Visit (INDEPENDENT_AMBULATORY_CARE_PROVIDER_SITE_OTHER): Payer: Medicare HMO | Admitting: Internal Medicine

## 2019-02-15 DIAGNOSIS — M810 Age-related osteoporosis without current pathological fracture: Secondary | ICD-10-CM

## 2019-02-15 DIAGNOSIS — Z791 Long term (current) use of non-steroidal anti-inflammatories (NSAID): Secondary | ICD-10-CM

## 2019-02-15 DIAGNOSIS — M62830 Muscle spasm of back: Secondary | ICD-10-CM

## 2019-02-15 NOTE — Progress Notes (Signed)
Internal Medicine Clinic Attending  I saw and evaluated the patient.  I personally confirmed the key portions of the history and exam documented by Dr. Aslam and I reviewed pertinent patient test results.  The assessment, diagnosis, and plan were formulated together and I agree with the documentation in the resident's note.     

## 2019-02-15 NOTE — Assessment & Plan Note (Signed)
Patient with left upper back muscle spasms after recent fall for which she was seen in the ED. X-ray ruled out any fractures. She reports improvement in symptoms with ibuprofen and heating pad to the area. On exam, there is some discomfort on palpation of left upper back between the inner left scapula and spine. The pain does not radiate along the spine. No tenderness to palpation of the cervical or thoracic spine. No limitations in ROM of the left upper extremity and 5/5 strength appreciated in bilateral upper extremities. At this time, patient is improving and will continue to recommend symptomatic treatment and monitor.  -Voltaren 1% gel BID  - Heating pad to the area

## 2019-02-15 NOTE — Patient Instructions (Addendum)
Rebecca Estes,  It was a pleasure seeing you in clinic today. We saw you today regarding your back muscle spasm.  - At this time, use the voltaren gel you have at home and apply it to your left back twice a day.  -Continue to use your heating pad.  - Please contact us if your symptoms do not improve or worsen.

## 2019-02-15 NOTE — Progress Notes (Signed)
   CC: back muscle spasm  HPI:  [name] is a 85 y.o. female with PMHx of osteoporosis presenting to clinic for left upper back muscle spasm. She was seen in the ED on 7/6 for the same. She reports a recent fall after which she developed intermittent muscle spasms of her left upper back that are more prominent when she is standing and are relieved with sitting or lying down. No fracture was appreciated on X-ray. She was advised to take ibuprofen. She reports that she has been taking ibuprofen and applying heat to the area which has resulted in symptomatic relief. She denies any shortness of breath, radiation of pain along the spine, focal weakness of left extremity, or neck pain.   Past Medical History:  Diagnosis Date  . Arrhythmia   . Bleeding ulcer   . Cancer ([place])   . Hiatal hernia   . Hypertension   . Hyperthyroidism   . Osteoporosis   . Thoracic ascending aortic aneurysm ([place]) 11/06/2017   63mm diagnosed by echo 10/24/17.   Review of Systems:  Review of Systems  Constitutional: Negative for chills, fever and malaise/fatigue.  Respiratory: Negative for shortness of breath.   Cardiovascular: Negative for chest pain and palpitations.  Musculoskeletal: Positive for myalgias.       Left sided upper back pain  Neurological: Negative for tingling, sensory change and focal weakness.     Physical Exam: Physical Exam  Constitutional: She is oriented to person, place, and time and well-developed, well-nourished, and in no distress.  HENT:  Head: Normocephalic and atraumatic.  Neck: Normal range of motion. Neck supple.  Cardiovascular:  Pulses:      Radial pulses are 2+ on the right side and 2+ on the left side.  Musculoskeletal: Normal range of motion.        General: No deformity.       Arms:     Comments: No tenderness along the cervical or thoracic spine noted. Some discomfort present with palpation of left upper back between inner left scapula and spine. No obvious  deformity noted.  No limitation in ROM of left upper extremity. Negative empty can test.  Strength 5/5 in bilateral upper extremities   Neurological: She is alert and oriented to person, place, and time. She has normal sensation and normal strength.     Vitals:   02/15/19 1106  BP: (!) 153/74  Pulse: 60  Temp: 97.7 F (36.5 C)  TempSrc: Oral  SpO2: 100%  Weight: 106 lb 11.2 oz (48.4 kg)  Height: 5\' 5"  (1.651 m)   Assessment & Plan:   See Encounters Tab for problem based charting.  Patient seen with Dr. Evette Doffing

## 2019-03-06 ENCOUNTER — Ambulatory Visit (INDEPENDENT_AMBULATORY_CARE_PROVIDER_SITE_OTHER): Payer: Medicare HMO | Admitting: Internal Medicine

## 2019-03-06 ENCOUNTER — Other Ambulatory Visit: Payer: Self-pay

## 2019-03-06 ENCOUNTER — Encounter: Payer: Self-pay | Admitting: Internal Medicine

## 2019-03-06 DIAGNOSIS — I10 Essential (primary) hypertension: Secondary | ICD-10-CM | POA: Diagnosis not present

## 2019-03-06 DIAGNOSIS — M62830 Muscle spasm of back: Secondary | ICD-10-CM | POA: Diagnosis not present

## 2019-03-06 DIAGNOSIS — Z79899 Other long term (current) drug therapy: Secondary | ICD-10-CM

## 2019-03-06 DIAGNOSIS — M810 Age-related osteoporosis without current pathological fracture: Secondary | ICD-10-CM

## 2019-03-06 DIAGNOSIS — Z7983 Long term (current) use of bisphosphonates: Secondary | ICD-10-CM

## 2019-03-06 DIAGNOSIS — Z791 Long term (current) use of non-steroidal anti-inflammatories (NSAID): Secondary | ICD-10-CM | POA: Diagnosis not present

## 2019-03-06 DIAGNOSIS — J302 Other seasonal allergic rhinitis: Secondary | ICD-10-CM

## 2019-03-06 DIAGNOSIS — M25511 Pain in right shoulder: Secondary | ICD-10-CM

## 2019-03-06 DIAGNOSIS — G8929 Other chronic pain: Secondary | ICD-10-CM

## 2019-03-06 MED ORDER — VITAMIN D3 25 MCG (1000 UT) PO CAPS: ORAL_CAPSULE | ORAL | 2 refills | Status: AC

## 2019-03-06 MED ORDER — METOPROLOL SUCCINATE ER 50 MG PO TB24: 50.0000 mg | ORAL_TABLET | Freq: Every day | ORAL | 3 refills | Status: DC

## 2019-03-06 MED ORDER — DICLOFENAC SODIUM 1 % TD GEL: TRANSDERMAL | 0 refills | Status: DC

## 2019-03-06 MED ORDER — ALENDRONATE SODIUM 70 MG PO TABS: 70.0000 mg | ORAL_TABLET | ORAL | 3 refills | Status: DC

## 2019-03-06 MED ORDER — FLUTICASONE PROPIONATE 50 MCG/ACT NA SUSP: 1.0000 | Freq: Every day | NASAL | 2 refills | Status: DC

## 2019-03-06 MED ORDER — CYCLOBENZAPRINE HCL 5 MG PO TABS: 5.0000 mg | ORAL_TABLET | ORAL | 0 refills | Status: DC | PRN

## 2019-03-06 NOTE — Assessment & Plan Note (Signed)
Refilled alendronate 70mg  weekly and vitamin D3 1000UT caps

## 2019-03-06 NOTE — Assessment & Plan Note (Signed)
BP 153/83 at this visit. Patient reports home BP readings of 120s/70-80. Patient on metoprolol 50mg  daily and amlodipine 10mg  daily. Tolerating well and requesting refills.   - Refilled metoprolol 50mg , amlodipine 10mg  daily

## 2019-03-06 NOTE — Assessment & Plan Note (Signed)
Patient presented for follow up of left upper back muscle spasms. She was seen in clinic on 7/10 for this and was prescribed voltaren 1% gel bid with heating pad and ibuprofen use. Patient reports improvement in symptoms but some continued muscle tightness especially after standing for an extended period of time. She reports relief with laying down. She denies any radiation of pain along scapula or spine, fever, shortness of breath or head/neck pain. On examinations, there is slight discomfort and muscle tightness with palpation at the medial aspect of the left scapula without any paravertebral muscle tenderness or tenderness along the spinous processes. No limitations in ROM or any sensory or motor deficits noted.   - Trial of cyclobenzaprine 5mg  for muscle relaxation  - Continue voltaren 1% gel and ibuprofen

## 2019-03-06 NOTE — Assessment & Plan Note (Signed)
Patient reports not taking cetirizine 5mg  daily. She reports using flonase nasal spray daily and is requesting refills on this.  - Refilled Flonase 36mcg/act nasal spray

## 2019-03-06 NOTE — Patient Instructions (Addendum)
Rebecca Estes,  It was a pleasure seeing you in clinic today. Today, we discussed your shoulder pain.   You've made great progress so far! To help you with the pain, we would like you to start Flexeril 5mg  as needed.  We have sent prescription refills to the [facility].  Please contact us if you have any concerns.  Thank you!

## 2019-03-06 NOTE — Progress Notes (Signed)
   CC: back muscle spasm f/u   HPI:  [name] is a 85 y.o. female with PMHx of osteoporosis presenting to clinic for follow up on her left upper back muscle spasm. She was seen in clinic on 7/10 for this and recommended use of diclofenac 1% gel and heating pad. Patient reports she has been using the gel and taking ibuprofen which has provided relief. She denies any radiation of pain along scapula or spine, shortness of breath, fever, head or neck pain.   Past Medical History:  Diagnosis Date  . Arrhythmia   . Bleeding ulcer   . Cancer ([place])   . Hiatal hernia   . Hypertension   . Hyperthyroidism   . Osteoporosis   . Thoracic ascending aortic aneurysm ([place]) 11/06/2017   1mm diagnosed by echo 10/24/17.   Review of Systems:  Review of Systems  Constitutional: Negative for chills, fever and malaise/fatigue.  Respiratory: Negative for cough and shortness of breath.   Cardiovascular: Negative for chest pain and palpitations.  Musculoskeletal: Positive for back pain. Negative for falls, joint pain, myalgias and neck pain.       Left upper back, improved  Neurological: Negative for dizziness, sensory change, focal weakness and headaches.     Physical Exam:  There were no vitals filed for this visit. Physical Exam  Constitutional: She is well-developed, well-nourished, and in no distress.  HENT:  Head: Normocephalic and atraumatic.  Neck: Normal range of motion. Neck supple.  Cardiovascular: Normal rate, regular rhythm, normal heart sounds and intact distal pulses. Exam reveals no gallop and no friction rub.  No murmur heard. Pulmonary/Chest: Effort normal and breath sounds normal. No respiratory distress. She has no wheezes. She has no rales.  Musculoskeletal: Normal range of motion.        General: Tenderness present. No deformity or edema.     Comments: Slight discomfort with palpation of left upper back at medial aspect of left scapula. No tenderness to palpation at  the paravertebral muscles. No obvious scapular or spinal deformity noted. No tenderness to palpation along the cervical or thoracic spine.  Normal ROM of bilateral upper extremities. Strength 5/5 in bilateral upper extremities and sensation to pinprick and dull sensation intact.      Assessment & Plan:   See Encounters Tab for problem based charting.  Patient seen with Dr. Dareen Piano

## 2019-03-08 NOTE — Addendum Note (Signed)
Addended by: Aldine Contes on: 03/08/2019 03:43 PM   Modules accepted: Level of Service

## 2019-03-08 NOTE — Progress Notes (Signed)
Internal Medicine Clinic Attending  I saw and evaluated the patient.  I personally confirmed the key portions of the history and exam documented by Dr. Aslam and I reviewed pertinent patient test results.  The assessment, diagnosis, and plan were formulated together and I agree with the documentation in the resident's note.     

## 2019-07-24 ENCOUNTER — Other Ambulatory Visit: Payer: Self-pay | Admitting: Internal Medicine

## 2019-09-11 ENCOUNTER — Telehealth: Payer: Self-pay | Admitting: Internal Medicine

## 2019-09-11 NOTE — Telephone Encounter (Signed)
Attempted to call pt back and give her dr Jodene Nam message, lm for rtc

## 2019-09-11 NOTE — Telephone Encounter (Signed)
Absolutely!, I think she would be a great candidate for either vaccine [employer] or Moderna.  COVID-19 Vaccine Information can be found at: ShippingScam.co.uk For questions related to vaccine distribution or appointments, please email [email or web address] or call [phone].

## 2019-09-11 NOTE — Telephone Encounter (Signed)
please advise, thank you.

## 2019-09-11 NOTE — Telephone Encounter (Signed)
Pt is wanting to know if she can get the covid vaccine [phone]

## 2019-09-17 NOTE — Telephone Encounter (Signed)
Tried to call again, lm for rtc

## 2019-10-03 ENCOUNTER — Encounter: Payer: Self-pay | Admitting: Internal Medicine

## 2019-10-03 ENCOUNTER — Ambulatory Visit (INDEPENDENT_AMBULATORY_CARE_PROVIDER_SITE_OTHER): Payer: Medicare HMO | Admitting: Internal Medicine

## 2019-10-03 ENCOUNTER — Other Ambulatory Visit: Payer: Self-pay

## 2019-10-03 VITALS — BP 148/76 | HR 54 | Temp 98.0°F | Ht 65.0 in | Wt 111.0 lb

## 2019-10-03 DIAGNOSIS — I712 Thoracic aortic aneurysm, without rupture, unspecified: Secondary | ICD-10-CM

## 2019-10-03 DIAGNOSIS — E0590 Thyrotoxicosis, unspecified without thyrotoxic crisis or storm: Secondary | ICD-10-CM

## 2019-10-03 DIAGNOSIS — H6121 Impacted cerumen, right ear: Secondary | ICD-10-CM | POA: Diagnosis not present

## 2019-10-03 DIAGNOSIS — R002 Palpitations: Secondary | ICD-10-CM | POA: Diagnosis not present

## 2019-10-03 DIAGNOSIS — I700 Atherosclerosis of aorta: Secondary | ICD-10-CM | POA: Diagnosis not present

## 2019-10-03 DIAGNOSIS — I7121 Aneurysm of the ascending aorta, without rupture: Secondary | ICD-10-CM

## 2019-10-03 DIAGNOSIS — I10 Essential (primary) hypertension: Secondary | ICD-10-CM | POA: Diagnosis not present

## 2019-10-03 DIAGNOSIS — Z79899 Other long term (current) drug therapy: Secondary | ICD-10-CM | POA: Diagnosis not present

## 2019-10-03 DIAGNOSIS — Z681 Body mass index (BMI) 19 or less, adult: Secondary | ICD-10-CM

## 2019-10-03 NOTE — Patient Instructions (Signed)
I will call with the lab results.  I am ordering a CT scan to take another look at your Aorta.

## 2019-10-04 DIAGNOSIS — H6121 Impacted cerumen, right ear: Secondary | ICD-10-CM | POA: Insufficient documentation

## 2019-10-04 LAB — CBC
Hematocrit: 36.1 % (ref 34.0–46.6)
Hemoglobin: 12.5 g/dL (ref 11.1–15.9)
MCH: 31.3 pg (ref 26.6–33.0)
MCHC: 34.6 g/dL (ref 31.5–35.7)
MCV: 90 fL (ref 79–97)
Platelets: 248 10*3/uL (ref 150–450)
RBC: 4 x10E6/uL (ref 3.77–5.28)
RDW: 12.6 % (ref 11.7–15.4)
WBC: 4 10*3/uL (ref 3.4–10.8)

## 2019-10-04 LAB — T4, FREE: Free T4: 1.08 ng/dL (ref 0.82–1.77)

## 2019-10-04 LAB — LIPID PANEL
Chol/HDL Ratio: 2.2 ratio (ref 0.0–4.4)
Cholesterol, Total: 213 mg/dL — ABNORMAL HIGH (ref 100–199)
HDL: 98 mg/dL (ref >39)
LDL Chol Calc (NIH): 107 mg/dL — ABNORMAL HIGH (ref 0–99)
Triglycerides: 44 mg/dL (ref 0–149)
VLDL Cholesterol Cal: 8 mg/dL (ref 5–40)

## 2019-10-04 LAB — BMP8+ANION GAP
Anion Gap: 12 mmol/L (ref 10.0–18.0)
BUN/Creatinine Ratio: 22 (ref 12–28)
BUN: 16 mg/dL (ref 8–27)
CO2: 24 mmol/L (ref 20–29)
Calcium: 9.6 mg/dL (ref 8.7–10.3)
Chloride: 105 mmol/L (ref 96–106)
Creatinine, Ser: 0.73 mg/dL (ref 0.57–1.00)
GFR calc Af Amer: 88 mL/min/{1.73_m2} (ref >59)
GFR calc non Af Amer: 76 mL/min/{1.73_m2} (ref >59)
Glucose: 80 mg/dL (ref 65–99)
Potassium: 4.4 mmol/L (ref 3.5–5.2)
Sodium: 141 mmol/L (ref 134–144)

## 2019-10-04 LAB — TSH: TSH: 0.108 u[IU]/mL — ABNORMAL LOW (ref 0.450–4.500)

## 2019-10-04 NOTE — Progress Notes (Signed)
  Subjective:  HPI: [name] is a 86 y.o. female who presents for f/u aortic aneurysm, right ear fullness  Please see Assessment and Plan below for the status of her chronic medical problems.  Review of Systems: Review of Systems  Constitutional: Negative for chills and fever.  Respiratory: Negative for cough and shortness of breath.   Cardiovascular: Positive for palpitations. Negative for chest pain and leg swelling.  Gastrointestinal: Negative for abdominal pain.  Genitourinary: Negative for dysuria.  Musculoskeletal: Negative for myalgias.  Neurological: Negative for dizziness.  Psychiatric/Behavioral: Negative for substance abuse.    Objective:  Physical Exam: Vitals:   10/03/19 0857  BP: (!) 148/76  Pulse: (!) 54  Temp: 98 F (36.7 C)  TempSrc: Oral  SpO2: 100%  Weight: 111 lb (50.3 kg)  Height: 5\' 5"  (1.651 m)   Body mass index is 18.47 kg/m. Physical Exam Vitals and nursing note reviewed.  Constitutional:      Appearance: Normal appearance.  HENT:     Right Ear: Tympanic membrane normal.     Left Ear: Tympanic membrane, ear canal and external ear normal.     Ears:     Comments: Cerumen and white debres in right ear canal, lavage of limited success, was able to manually remove with curette, right external ear canal erythematous    Mouth/Throat:     Mouth: Mucous membranes are moist.     Comments: PND Eyes:     Conjunctiva/sclera: Conjunctivae normal.  Cardiovascular:     Rate and Rhythm: Normal rate and regular rhythm.     Heart sounds: No murmur.  Pulmonary:     Effort: Pulmonary effort is normal.     Breath sounds: Normal breath sounds. No wheezing or rhonchi.  Abdominal:     Palpations: Abdomen is soft.     Tenderness: There is no abdominal tenderness.  Musculoskeletal:     Right lower leg: No edema.     Left lower leg: No edema.  Neurological:     Mental Status: She is alert.    Assessment & Plan:  See Encounters Tab for problem  based charting.  Medications Ordered No orders of the defined types were placed in this encounter.  Other Orders Orders Placed This Encounter  Procedures  . CT ANGIO CHEST AORTA W/CM & OR WO/CM    Standing Status:   Future    Standing Expiration Date:   11/30/2020    Order Specific Question:   If indicated for the ordered procedure, I authorize the administration of contrast media per Radiology protocol    Answer:   Yes    Order Specific Question:   Preferred imaging location?    Answer:   [facility]    Order Specific Question:   Radiology Contrast Protocol - do NOT remove file path    Answer:   [email or web address]  . BMP8+Anion Gap  . TSH  . T4, Free  . CBC no Diff  . Lipid Profile   Follow Up: No follow-ups on file.

## 2019-10-04 NOTE — Assessment & Plan Note (Signed)
Chronic and stable continue pravastatin 40 mg daily 

## 2019-10-04 NOTE — Assessment & Plan Note (Signed)
HPI: She reports some right ear fullness she reports that she has been using hydroperoxide and Debrox in her ear.  Denies decreased hearing or drainage from the ear does not have any fever.  Assessment ceruminosis of the right ear  Plan Attempted to lavage cerumen out I had to additionally manually remove dried cerumen her external ear canal is mildly irritated in the area suspect from overuse of hydrogen peroxide.  Recommended no use of Debrox for hydroperoxide for the next several days and to call me if she does not feel better.

## 2019-10-04 NOTE — Assessment & Plan Note (Signed)
She has gained 5 pounds since her last visit.

## 2019-10-04 NOTE — Assessment & Plan Note (Signed)
HPI: taking  metoprolol tartrate 50mg  and amlodipine 10mg  daily.  No side effects reported. No syncope, or near syncope.  A: essential HTN, mildly above goal  P: Continue metoprolol xl 50mg  daily  Continue  amlodipine 10mg  daily If remains elevated will likely add ACEi

## 2019-10-04 NOTE — Assessment & Plan Note (Signed)
HPI: She reports to me an episode of palpitations.  Her husband is also present he tried to check her pulse and thought it was regular.  She felt more that she had some pounding in her chest.  We discussed that we had previously worked this up with a heart monitor as well as echocardiogram.  She was noted to have PVCs.  I discussed that her metoprolol helps to decrease the rate of PACs and she admitted that she had forgotten to take her metoprolol for the 2 days prior to the episode.  Assessment palpitations likely due to PACs  Plan Continue metoprolol tartrate 50 mg twice daily.

## 2019-10-04 NOTE — Assessment & Plan Note (Signed)
HPI: Did have episode of symptomatic palpitations but otherwise feels well.  Assessment subclinical hyperthyroidism  Plan Due for repeat labs we will recheck TSH and free T4.

## 2019-10-04 NOTE — Assessment & Plan Note (Signed)
Past due for annual screening due to COVID-19, we discussed this and I have ordered repeat CTA.

## 2019-10-07 ENCOUNTER — Other Ambulatory Visit: Payer: Self-pay | Admitting: *Deleted

## 2019-10-07 DIAGNOSIS — M810 Age-related osteoporosis without current pathological fracture: Secondary | ICD-10-CM

## 2019-10-08 MED ORDER — METOPROLOL SUCCINATE ER 50 MG PO TB24: 50.0000 mg | ORAL_TABLET | Freq: Every day | ORAL | 3 refills | Status: DC

## 2019-10-08 MED ORDER — PRAVASTATIN SODIUM 40 MG PO TABS: 40.0000 mg | ORAL_TABLET | Freq: Every evening | ORAL | 3 refills | Status: DC

## 2019-10-08 MED ORDER — CYCLOBENZAPRINE HCL 5 MG PO TABS: 5.0000 mg | ORAL_TABLET | ORAL | 0 refills | Status: DC | PRN

## 2019-10-08 MED ORDER — ALENDRONATE SODIUM 70 MG PO TABS: 70.0000 mg | ORAL_TABLET | ORAL | 3 refills | Status: AC

## 2019-10-08 MED ORDER — FLUTICASONE PROPIONATE 50 MCG/ACT NA SUSP: 1.0000 | Freq: Every day | NASAL | 3 refills | Status: DC

## 2019-10-29 ENCOUNTER — Telehealth: Payer: Self-pay | Admitting: Internal Medicine

## 2019-10-29 NOTE — Telephone Encounter (Signed)
Pls contact [place]

## 2019-10-31 ENCOUNTER — Other Ambulatory Visit: Payer: Self-pay

## 2019-10-31 ENCOUNTER — Ambulatory Visit
Admission: RE | Admit: 2019-10-31 | Discharge: 2019-10-31 | Disposition: A | Discharge status: Home / Self Care | Payer: Medicare HMO | Source: Ambulatory Visit | Attending: Internal Medicine | Admitting: Internal Medicine

## 2019-10-31 DIAGNOSIS — I712 Thoracic aortic aneurysm, without rupture, unspecified: Secondary | ICD-10-CM

## 2019-10-31 DIAGNOSIS — I77810 Thoracic aortic ectasia: Secondary | ICD-10-CM | POA: Diagnosis not present

## 2019-10-31 MED ORDER — IOPAMIDOL (ISOVUE-370) INJECTION 76%
75.0000 mL | Freq: Once | INTRAVENOUS | Status: AC | PRN
  Administered 2019-10-31: 75 mL via INTRAVENOUS

## 2019-12-04 DIAGNOSIS — Z961 Presence of intraocular lens: Secondary | ICD-10-CM | POA: Diagnosis not present

## 2019-12-04 DIAGNOSIS — H04123 Dry eye syndrome of bilateral lacrimal glands: Secondary | ICD-10-CM | POA: Diagnosis not present

## 2019-12-04 DIAGNOSIS — H40013 Open angle with borderline findings, low risk, bilateral: Secondary | ICD-10-CM | POA: Diagnosis not present

## 2019-12-31 ENCOUNTER — Encounter: Payer: Self-pay | Admitting: Internal Medicine

## 2019-12-31 ENCOUNTER — Telehealth (INDEPENDENT_AMBULATORY_CARE_PROVIDER_SITE_OTHER): Payer: Medicare HMO | Admitting: Internal Medicine

## 2019-12-31 DIAGNOSIS — I712 Thoracic aortic aneurysm, without rupture: Secondary | ICD-10-CM

## 2019-12-31 DIAGNOSIS — Z7689 Persons encountering health services in other specified circumstances: Secondary | ICD-10-CM

## 2019-12-31 DIAGNOSIS — I700 Atherosclerosis of aorta: Secondary | ICD-10-CM

## 2019-12-31 DIAGNOSIS — I10 Essential (primary) hypertension: Secondary | ICD-10-CM

## 2019-12-31 DIAGNOSIS — E0590 Thyrotoxicosis, unspecified without thyrotoxic crisis or storm: Secondary | ICD-10-CM | POA: Diagnosis not present

## 2019-12-31 DIAGNOSIS — I7121 Aneurysm of the ascending aorta, without rupture: Secondary | ICD-10-CM

## 2019-12-31 MED ORDER — AMLODIPINE BESYLATE 10 MG PO TABS: 10.0000 mg | ORAL_TABLET | Freq: Every day | ORAL | 1 refills | Status: DC

## 2019-12-31 MED ORDER — CYCLOBENZAPRINE HCL 5 MG PO TABS: 5.0000 mg | ORAL_TABLET | ORAL | 0 refills | Status: DC | PRN

## 2019-12-31 NOTE — Progress Notes (Signed)
Virtual Visit via Telephone Note  I connected with Rebecca Estes, on 12/31/2019 at 9:52 AM by telephone due to the COVID-19 pandemic and verified that I am speaking with the correct person using two identifiers.   Consent: I discussed the limitations, risks, security and privacy concerns of performing an evaluation and management service by telephone and the availability of in person appointments. I also discussed with the patient that there may be a patient responsible charge related to this service. The patient expressed understanding and agreed to proceed.   Location of Patient: Home   Location of Provider: Clinic    Persons participating in Telemedicine visit: JAVONA BENSTON [facility] Dr. Juleen China      History of Present Illness: Patient has a visit to establish care. She has PMH of HTN, thoracic ascending aneurysm, atherosclerosis, osteoporosis.   She does not have any concerns today. Does need refills for Amlodipine. Her husband helps her to monitor her BP because she does not do well using the cuff herself. Last check: 130/85. No chest pain, vision changes, headaches. She has made some diet changes.    Past Medical History:  Diagnosis Date  . Arrhythmia   . Bleeding ulcer   . Cancer ([place])   . Hiatal hernia   . Hypertension   . Hyperthyroidism   . Osteoporosis   . Thoracic ascending aortic aneurysm ([place]) 11/06/2017   64mm diagnosed by echo 10/24/17.   Allergies  Allergen Reactions  . Methocarbamol     Causes joint pain    Current Outpatient Medications on File Prior to Visit  Medication Sig Dispense Refill  . alendronate (FOSAMAX) 70 MG tablet Take 1 tablet (70 mg total) by mouth once a week. Take with a full glass of water on an empty stomach. 12 tablet 3  . amLODipine (NORVASC) 10 MG tablet TAKE 1 TABLET BY MOUTH EVERY DAY 90 tablet 1  . aspirin EC 325 MG tablet Take 325 mg by mouth every morning.     . Cholecalciferol (VITAMIN D3) 25 MCG  (1000 UT) CAPS Vitamin D3 1,000 unit capsule  Take 1 capsule every day by oral route. 60 capsule 2  . fluticasone (FLONASE) 50 MCG/ACT nasal spray Place 1 spray into both nostrils daily. 16 g 3  . metoprolol succinate (TOPROL XL) 50 MG 24 hr tablet Take 1 tablet (50 mg total) by mouth daily. Take with or immediately following a meal. 90 tablet 3  . pravastatin (PRAVACHOL) 40 MG tablet Take 1 tablet (40 mg total) by mouth every evening. 90 tablet 3   No current facility-administered medications on file prior to visit.    Observations/Objective: NAD. Speaking clearly.  Work of breathing normal.  Alert and oriented. Mood appropriate.   Assessment and Plan: 1. Encounter to establish care Reviewed patient's PMH, social history, surgical history, and medications.   2. Essential hypertension BP seems close to goal or at goal. Continue current medication regimen.  - amLODipine (NORVASC) 10 MG tablet; Take 1 tablet (10 mg total) by mouth daily.  Dispense: 90 tablet; Refill: 1  3. Thoracic ascending aortic aneurysm [facility]) Needs annual screening with CTA/MRI per previous PCP. Performed 10/31/19 with stable caliber of the thoracic aorta with maximal diameter of 3.8 cm in the ascending aorta. Imaging result actually mentions debatable whether routine imaging would need to be continued as not overtly aneurysmal until it reaches 4 cm.   4. Subclinical hyperthyroidism TSH 0.108 with normal free T4 at 1.08 in Feb 2021. Will  continue to monitor for progression.   5. Aortic atherosclerosis ([facility]) Continue pravastatin and ASA therapy.    Follow Up Instructions: 3 month f/u for chronic medical conditions    I discussed the assessment and treatment plan with the patient. The patient was provided an opportunity to ask questions and all were answered. The patient agreed with the plan and demonstrated an understanding of the instructions.   The patient was advised to call back or seek an in-person evaluation  if the symptoms worsen or if the condition fails to improve as anticipated.     I provided 28 minutes total of non-face-to-face time during this encounter including median intraservice time, reviewing previous notes, investigations, ordering medications, medical decision making, coordinating care and patient verbalized understanding at the end of the visit.    Phill Myron, D.O. Primary Care at [facility]  12/31/2019, 9:52 AM

## 2020-03-22 ENCOUNTER — Emergency Department (HOSPITAL_COMMUNITY)
Admission: EM | Admit: 2020-03-22 | Discharge: 2020-03-22 | Disposition: A | Discharge status: Home / Self Care | Payer: Medicare HMO | Attending: Emergency Medicine | Admitting: Emergency Medicine

## 2020-03-22 ENCOUNTER — Encounter (HOSPITAL_COMMUNITY): Payer: Self-pay | Admitting: Emergency Medicine

## 2020-03-22 DIAGNOSIS — Z5321 Procedure and treatment not carried out due to patient leaving prior to being seen by health care provider: Secondary | ICD-10-CM | POA: Insufficient documentation

## 2020-03-22 DIAGNOSIS — M549 Dorsalgia, unspecified: Secondary | ICD-10-CM | POA: Diagnosis not present

## 2020-03-22 NOTE — ED Triage Notes (Signed)
Patient complains of upper back pain that started 3 days ago. Patient denies any known injuries, denies falling recently. Patient alert and oriented. No other complaints.

## 2020-03-22 NOTE — ED Notes (Signed)
Pt stated she was going to leave and get seen tomorrow. This NT explained to pt that she would have to start the process again if she left, and encouraged pt to stay. Pt verbalized understanding and stated she was leaving.

## 2020-03-23 ENCOUNTER — Ambulatory Visit
Admission: EM | Admit: 2020-03-23 | Discharge: 2020-03-23 | Disposition: A | Discharge status: Home / Self Care | Payer: Medicare HMO | Attending: Physician Assistant | Admitting: Physician Assistant

## 2020-03-23 DIAGNOSIS — M546 Pain in thoracic spine: Secondary | ICD-10-CM

## 2020-03-23 DIAGNOSIS — M898X1 Other specified disorders of bone, shoulder: Secondary | ICD-10-CM | POA: Diagnosis not present

## 2020-03-23 MED ORDER — DICLOFENAC SODIUM 1 % EX GEL: 2.0000 g | Freq: Four times a day (QID) | CUTANEOUS | 0 refills | Status: DC

## 2020-03-23 MED ORDER — TIZANIDINE HCL 2 MG PO TABS: 2.0000 mg | ORAL_TABLET | Freq: Two times a day (BID) | ORAL | 0 refills | Status: DC | PRN

## 2020-03-23 NOTE — ED Provider Notes (Signed)
[facility]    CSN: [id number] Arrival date & time: 03/23/20  1247      History   Chief Complaint Chief Complaint  Patient presents with  . Back Pain    HPI Rebecca Estes is a 86 y.o. female.   86 year old female comes in for 1 week history left/mid upper back pain. No injury/trauma. No pain at rest, pain with movement. Denies cough, shortness of breath, chest pain, fever. Denies radiation of pain. otc medicine with temporary relief.      Past Medical History:  Diagnosis Date  . Arrhythmia   . Bleeding ulcer   . Cancer ([place])   . Hiatal hernia   . Hypertension   . Hyperthyroidism   . Osteoporosis   . Thoracic ascending aortic aneurysm ([place]) 11/06/2017   77mm diagnosed by echo 10/24/17.    Patient Active Problem List   Diagnosis Date Noted  . Ceruminosis, right 10/04/2019  . Back muscle spasm 02/15/2019  . Muscle cramp, nocturnal 12/27/2018  . Cough 10/12/2018  . Hernia of abdominal wall 10/12/2018  . Aortic atherosclerosis ([place]) 07/24/2018  . Chronic pain of both shoulders 07/24/2018  . BMI less than 19,adult 02/01/2018  . Difficulty chewing due to dentures 02/01/2018  . Thoracic ascending aortic aneurysm ([place]) 11/06/2017  . Palpitations 10/05/2017  . Seasonal allergies 09/18/2017  . Essential hypertension 05/26/2016  . Osteoporosis 05/26/2016  . Subclinical hyperthyroidism 05/26/2016    Past Surgical History:  Procedure Laterality Date  . ANTERIOR CERVICAL DECOMP/DISCECTOMY FUSION     x3  . BREAST BIOPSY Left 02/10/2009  . NECK SURGERY    . ROTATOR CUFF REPAIR     Left  . TUBAL LIGATION    . UMBILICAL HERNIA REPAIR      OB History   No obstetric history on file.      Home Medications    Prior to Admission medications   Medication Sig Start Date End Date Taking? Authorizing Provider  alendronate (FOSAMAX) 70 MG tablet Take 1 tablet (70 mg total) by mouth once a week. Take with a full glass of water on an empty stomach.  10/08/19   Lucious Groves, DO  amLODipine (NORVASC) 10 MG tablet Take 1 tablet (10 mg total) by mouth daily. 12/31/19   Nicolette Bang, DO  aspirin EC 325 MG tablet Take 325 mg by mouth every morning.     [provider]  Cholecalciferol (VITAMIN D3) 25 MCG (1000 UT) CAPS Vitamin D3 1,000 unit capsule  Take 1 capsule every day by oral route. 03/06/19   Harvie Heck, MD  diclofenac Sodium (VOLTAREN) 1 % GEL Apply 2 g topically 4 (four) times daily. 03/23/20   Tasia Catchings, Gladies Sofranko V, PA-C  fluticasone (FLONASE) 50 MCG/ACT nasal spray Place 1 spray into both nostrils daily. 10/08/19   Lucious Groves, DO  metoprolol succinate (TOPROL XL) 50 MG 24 hr tablet Take 1 tablet (50 mg total) by mouth daily. Take with or immediately following a meal. 10/08/19 10/07/20  Lucious Groves, DO  pravastatin (PRAVACHOL) 40 MG tablet Take 1 tablet (40 mg total) by mouth every evening. 10/08/19 10/07/20  Lucious Groves, DO  tiZANidine (ZANAFLEX) 2 MG tablet Take 1 tablet (2 mg total) by mouth 2 (two) times daily as needed for muscle spasms. 03/23/20   Ok Edwards, PA-C    Family History Family History  Problem Relation Age of Onset  . Aneurysm Father 19       Brain  .  Other Mother 17       Blood Infection  . Coronary artery disease Brother 21  . Stroke Brother 85  . Aneurysm Sister 37       Brain  . Cervical cancer Sister 78  . Kidney failure Sister 59       was on Dialysis  . Aneurysm Sister        Brain    Social History Social History   Tobacco Use  . Smoking status: Never Smoker  . Smokeless tobacco: Never Used  Substance Use Topics  . Alcohol use: No  . Drug use: No     Allergies   Methocarbamol   Review of Systems Review of Systems  Reason unable to perform ROS: See HPI as above.     Physical Exam Triage Vital Signs ED Triage Vitals  Enc Vitals Group     BP 03/23/20 1257 (!) 164/95     Pulse Rate 03/23/20 1257 61     Resp 03/23/20 1257 16     Temp 03/23/20 1257 98.1 F (36.7 C)      Temp Source 03/23/20 1257 Oral     SpO2 03/23/20 1257 98 %     Weight --      Height --      Head Circumference --      Peak Flow --      Pain Score 03/23/20 1307 6     Pain Loc --      Pain Edu? --      Excl. in [place]? --    No data found.  Updated Vital Signs BP (!) 164/95 (BP Location: Left Arm)   Pulse 61   Temp 98.1 F (36.7 C) (Oral)   Resp 16   SpO2 98%   Physical Exam Constitutional:      General: She is not in acute distress.    Appearance: Normal appearance. She is well-developed. She is not toxic-appearing or diaphoretic.  HENT:     Head: Normocephalic and atraumatic.  Eyes:     Conjunctiva/sclera: Conjunctivae normal.     Pupils: Pupils are equal, round, and reactive to light.  Cardiovascular:     Rate and Rhythm: Normal rate and regular rhythm.  Pulmonary:     Effort: Pulmonary effort is normal. No respiratory distress.     Comments: LCTAB Musculoskeletal:     Cervical back: Normal range of motion and neck supple.     Comments: No tenderness to palpation along spinous processes. Tenderness to palpation along left periscapular area. Full ROM of BUE. Strength 5/5. Sensation intact. Radial pulse 2+  Skin:    General: Skin is warm and dry.  Neurological:     Mental Status: She is alert and oriented to person, place, and time.      UC Treatments / Results  Labs (all labs ordered are listed, but only abnormal results are displayed) Labs Reviewed - No data to display  EKG   Radiology No results found.  Procedures Procedures (including critical care time)  Medications Ordered in UC Medications - No data to display  Initial Impression / Assessment and Plan / UC Course  I have reviewed the triage vital signs and the nursing notes.  Pertinent labs & imaging results that were available during my care of the patient were reviewed by me and considered in my medical decision making (see chart for details).    Pain with ROM, reproducible by palpation.  Without chest pain, shob, cough, fever, low suspicion for  pneumonia, thoracic aneurysm rupture. Patient states has had similar symptoms in the past, did well with muscle relaxant, given age, will trial short course of tizanidine. Discussed other symptomatic management. To follow up with PCP for recheck if symptoms not improving. Return precautions given.  Final Clinical Impressions(s) / UC Diagnoses   Final diagnoses:  Periscapular pain  Acute left-sided thoracic back pain    ED Prescriptions    Medication Sig Dispense Auth. Provider   diclofenac Sodium (VOLTAREN) 1 % GEL Apply 2 g topically 4 (four) times daily. 100 g Soniya Ashraf V, PA-C   tiZANidine (ZANAFLEX) 2 MG tablet Take 1 tablet (2 mg total) by mouth 2 (two) times daily as needed for muscle spasms. 10 tablet Ok Edwards, PA-C     PDMP not reviewed this encounter.   Ok Edwards, PA-C 03/23/20 1342

## 2020-03-23 NOTE — ED Triage Notes (Signed)
Pt c/o muscle spasms to lt/mid upper back x1wk. Denies injury.

## 2020-03-23 NOTE — Discharge Instructions (Signed)
Voltaren gel as directed. Tizanidine as needed, this can make you drowsy, so do not take if you are going to drive, operate heavy machinery, or make important decisions. Ice/heat compresses as needed. Follow up with PCP/orthopedics if symptoms worsen, changes for reevaluation. If having chest pain, shortness of breath, fever, go to the emergency department for further evaluation.

## 2020-03-26 ENCOUNTER — Emergency Department (HOSPITAL_COMMUNITY)
Admission: EM | Admit: 2020-03-26 | Discharge: 2020-03-26 | Discharge status: Against Medical Advice | Payer: Medicare HMO

## 2020-03-26 ENCOUNTER — Other Ambulatory Visit: Payer: Self-pay

## 2020-03-26 ENCOUNTER — Encounter: Payer: Self-pay | Admitting: Emergency Medicine

## 2020-03-26 ENCOUNTER — Encounter (HOSPITAL_COMMUNITY): Payer: Self-pay | Admitting: Emergency Medicine

## 2020-03-26 ENCOUNTER — Emergency Department (HOSPITAL_COMMUNITY): Payer: Medicare HMO

## 2020-03-26 ENCOUNTER — Ambulatory Visit
Admission: EM | Admit: 2020-03-26 | Discharge: 2020-03-26 | Disposition: A | Discharge status: Home / Self Care | Payer: Medicare HMO | Attending: Emergency Medicine | Admitting: Emergency Medicine

## 2020-03-26 DIAGNOSIS — R001 Bradycardia, unspecified: Secondary | ICD-10-CM | POA: Diagnosis not present

## 2020-03-26 DIAGNOSIS — I700 Atherosclerosis of aorta: Secondary | ICD-10-CM | POA: Diagnosis not present

## 2020-03-26 DIAGNOSIS — E0590 Thyrotoxicosis, unspecified without thyrotoxic crisis or storm: Secondary | ICD-10-CM | POA: Insufficient documentation

## 2020-03-26 DIAGNOSIS — I774 Celiac artery compression syndrome: Secondary | ICD-10-CM | POA: Diagnosis not present

## 2020-03-26 DIAGNOSIS — Z7982 Long term (current) use of aspirin: Secondary | ICD-10-CM | POA: Insufficient documentation

## 2020-03-26 DIAGNOSIS — R9431 Abnormal electrocardiogram [ECG] [EKG]: Secondary | ICD-10-CM

## 2020-03-26 DIAGNOSIS — M25511 Pain in right shoulder: Secondary | ICD-10-CM | POA: Insufficient documentation

## 2020-03-26 DIAGNOSIS — I10 Essential (primary) hypertension: Secondary | ICD-10-CM | POA: Insufficient documentation

## 2020-03-26 DIAGNOSIS — M549 Dorsalgia, unspecified: Secondary | ICD-10-CM | POA: Diagnosis not present

## 2020-03-26 DIAGNOSIS — I878 Other specified disorders of veins: Secondary | ICD-10-CM | POA: Diagnosis not present

## 2020-03-26 DIAGNOSIS — J439 Emphysema, unspecified: Secondary | ICD-10-CM | POA: Diagnosis not present

## 2020-03-26 DIAGNOSIS — M542 Cervicalgia: Secondary | ICD-10-CM | POA: Insufficient documentation

## 2020-03-26 DIAGNOSIS — R21 Rash and other nonspecific skin eruption: Secondary | ICD-10-CM | POA: Diagnosis not present

## 2020-03-26 DIAGNOSIS — M898X1 Other specified disorders of bone, shoulder: Secondary | ICD-10-CM | POA: Diagnosis not present

## 2020-03-26 DIAGNOSIS — M25512 Pain in left shoulder: Secondary | ICD-10-CM | POA: Diagnosis not present

## 2020-03-26 DIAGNOSIS — K5730 Diverticulosis of large intestine without perforation or abscess without bleeding: Secondary | ICD-10-CM | POA: Diagnosis not present

## 2020-03-26 DIAGNOSIS — I712 Thoracic aortic aneurysm, without rupture: Secondary | ICD-10-CM | POA: Diagnosis not present

## 2020-03-26 LAB — BASIC METABOLIC PANEL
Anion gap: 12 (ref 5–15)
BUN: 14 mg/dL (ref 8–23)
CO2: 25 mmol/L (ref 22–32)
Calcium: 10 mg/dL (ref 8.9–10.3)
Chloride: 106 mmol/L (ref 98–111)
Creatinine, Ser: 0.67 mg/dL (ref 0.44–1.00)
GFR calc Af Amer: >60 mL/min (ref >60)
GFR calc non Af Amer: >60 mL/min (ref >60)
Glucose, Bld: 90 mg/dL (ref 70–99)
Potassium: 3.5 mmol/L (ref 3.5–5.1)
Sodium: 143 mmol/L (ref 135–145)

## 2020-03-26 LAB — CBC
HCT: 38.9 % (ref 36.0–46.0)
Hemoglobin: 12.9 g/dL (ref 12.0–15.0)
MCH: 31.2 pg (ref 26.0–34.0)
MCHC: 33.2 g/dL (ref 30.0–36.0)
MCV: 94.2 fL (ref 80.0–100.0)
Platelets: 216 10*3/uL (ref 150–400)
RBC: 4.13 MIL/uL (ref 3.87–5.11)
RDW: 13.6 % (ref 11.5–15.5)
WBC: 3.8 10*3/uL — ABNORMAL LOW (ref 4.0–10.5)
nRBC: 0 % (ref 0.0–0.2)

## 2020-03-26 LAB — TROPONIN I (HIGH SENSITIVITY): Troponin I (High Sensitivity): 10 ng/L (ref <18)

## 2020-03-26 NOTE — ED Provider Notes (Signed)
[facility]    CSN: [id number] Arrival date & time: 03/26/20  1414      History   Chief Complaint Chief Complaint  Patient presents with  . Back Pain    HPI Rebecca Estes is a 86 y.o. female  With history of arrhythmia, hypertension, osteoporosis, hypothyroidism, thoracic ascending aortic aneurysm, and cancer presenting for persistent left scapular pain.  Patient initially seen for this on 03/23/2020: Given supportive care with Voltaren gel, Zanaflex.  States this did not really help.  Has had intermittent nausea without vomiting.  No lightheadedness, dizziness, weakness, headache, chest pain or palpitations, shortness of breath or cough, lower leg swelling or severe abdominal pain.  Past Medical History:  Diagnosis Date  . Arrhythmia   . Bleeding ulcer   . Cancer ([place])   . Hiatal hernia   . Hypertension   . Hyperthyroidism   . Osteoporosis   . Thoracic ascending aortic aneurysm ([place]) 11/06/2017   22mm diagnosed by echo 10/24/17.    Patient Active Problem List   Diagnosis Date Noted  . Ceruminosis, right 10/04/2019  . Back muscle spasm 02/15/2019  . Muscle cramp, nocturnal 12/27/2018  . Cough 10/12/2018  . Hernia of abdominal wall 10/12/2018  . Aortic atherosclerosis ([place]) 07/24/2018  . Chronic pain of both shoulders 07/24/2018  . BMI less than 19,adult 02/01/2018  . Difficulty chewing due to dentures 02/01/2018  . Thoracic ascending aortic aneurysm ([place]) 11/06/2017  . Palpitations 10/05/2017  . Seasonal allergies 09/18/2017  . Essential hypertension 05/26/2016  . Osteoporosis 05/26/2016  . Subclinical hyperthyroidism 05/26/2016    Past Surgical History:  Procedure Laterality Date  . ANTERIOR CERVICAL DECOMP/DISCECTOMY FUSION     x3  . BREAST BIOPSY Left 02/10/2009  . NECK SURGERY    . ROTATOR CUFF REPAIR     Left  . TUBAL LIGATION    . UMBILICAL HERNIA REPAIR      OB History   No obstetric history on file.      Home Medications      Prior to Admission medications   Medication Sig Start Date End Date Taking? Authorizing Provider  amLODipine (NORVASC) 10 MG tablet Take 1 tablet (10 mg total) by mouth daily. 12/31/19  Yes Nicolette Bang, DO  metoprolol succinate (TOPROL XL) 50 MG 24 hr tablet Take 1 tablet (50 mg total) by mouth daily. Take with or immediately following a meal. 10/08/19 10/07/20 Yes Lucious Groves, DO  alendronate (FOSAMAX) 70 MG tablet Take 1 tablet (70 mg total) by mouth once a week. Take with a full glass of water on an empty stomach. 10/08/19   Lucious Groves, DO  aspirin EC 325 MG tablet Take 325 mg by mouth every morning.     [provider]  Cholecalciferol (VITAMIN D3) 25 MCG (1000 UT) CAPS Vitamin D3 1,000 unit capsule  Take 1 capsule every day by oral route. 03/06/19   Harvie Heck, MD  diclofenac Sodium (VOLTAREN) 1 % GEL Apply 2 g topically 4 (four) times daily. 03/23/20   Tasia Catchings, Amy V, PA-C  fluticasone (FLONASE) 50 MCG/ACT nasal spray Place 1 spray into both nostrils daily. 10/08/19   Lucious Groves, DO  pravastatin (PRAVACHOL) 40 MG tablet Take 1 tablet (40 mg total) by mouth every evening. 10/08/19 10/07/20  Lucious Groves, DO  tiZANidine (ZANAFLEX) 2 MG tablet Take 1 tablet (2 mg total) by mouth 2 (two) times daily as needed for muscle spasms. 03/23/20   Ok Edwards,  PA-C    Family History Family History  Problem Relation Age of Onset  . Aneurysm Father 32       Brain  . Other Mother 45       Blood Infection  . Coronary artery disease Brother 25  . Stroke Brother 75  . Aneurysm Sister 55       Brain  . Cervical cancer Sister 81  . Kidney failure Sister 78       was on Dialysis  . Aneurysm Sister        Brain    Social History Social History   Tobacco Use  . Smoking status: Never Smoker  . Smokeless tobacco: Never Used  Substance Use Topics  . Alcohol use: No  . Drug use: No     Allergies   Methocarbamol   Review of Systems As per HPI   Physical Exam Triage  Vital Signs ED Triage Vitals [03/26/20 1514]  Enc Vitals Group     BP (!) 191/96     Pulse Rate (!) 58     Resp 18     Temp 97.8 F (36.6 C)     Temp Source Oral     SpO2 97 %     Weight      Height      Head Circumference      Peak Flow      Pain Score      Pain Loc      Pain Edu?      Excl. in [place]?    No data found.  Updated Vital Signs BP (!) 191/96 (BP Location: Left Arm)   Pulse (!) 58   Temp 97.8 F (36.6 C) (Oral)   Resp 18   SpO2 97%   Visual Acuity Right Eye Distance:   Left Eye Distance:   Bilateral Distance:    Right Eye Near:   Left Eye Near:    Bilateral Near:     Physical Exam Constitutional:      General: She is not in acute distress.    Comments: thin  HENT:     Head: Normocephalic and atraumatic.     Mouth/Throat:     Mouth: Mucous membranes are moist.     Pharynx: Oropharynx is clear.  Eyes:     General: No scleral icterus.    Pupils: Pupils are equal, round, and reactive to light.  Cardiovascular:     Rate and Rhythm: Regular rhythm. Bradycardia present.     Heart sounds: Normal heart sounds.  Pulmonary:     Effort: Pulmonary effort is normal. No respiratory distress.     Breath sounds: No wheezing.  Abdominal:     Tenderness: There is no abdominal tenderness.  Musculoskeletal:     Comments: Left intrascapular muscle with mild edema, exquisite TTP.  No crepitus, warmth, bony deformity or tenderness.  No chest tenderness.  Skin:    Capillary Refill: Capillary refill takes less than 2 seconds.     Coloration: Skin is not jaundiced or pale.     Findings: No bruising or rash.  Neurological:     General: No focal deficit present.     Mental Status: She is alert and oriented to person, place, and time.      UC Treatments / Results  Labs (all labs ordered are listed, but only abnormal results are displayed) Labs Reviewed - No data to display  EKG   Radiology No results found.  Procedures Procedures (including critical care  time)  Medications Ordered in UC Medications - No data to display  Initial Impression / Assessment and Plan / UC Course  I have reviewed the triage vital signs and the nursing notes.  Pertinent labs & imaging results that were available during my care of the patient were reviewed by me and considered in my medical decision making (see chart for details).     Patient febrile, nontoxic in office today.  Patient does have persistent elevated blood pressure readings with bradycardia and left scapular pain.  EKG done in office, reviewed by me compared to previous from 10/05/2017: Sinus bradycardia with ventricular rate 49 bpm.  No QTC prolongation.  New T wave inversions in lead V2, V3 with mild ST elevation in V4.  Otherwise stable.  Concerning for anteroseptal pathology.  Recommend patient go to ER for further evaluation.  Will follow up with orthopedics thereafter.  Patient electing to transfer with her husband and personal vehicle.  Stable condition.  Return precautions discussed, pt & husband verbalized understanding and are agreeable to plan. Final Clinical Impressions(s) / UC Diagnoses   Final diagnoses:  Bradycardia  Pain of left scapula  Abnormal EKG     Discharge Instructions     Patient has already been seen multiple times for left scapular pain x1 week.  Does have intermittent nausea.  No chest pain or difficulty breathing.  Has failed conservative management, now has EKG changes as compared to previous read.    ED Prescriptions    None     PDMP not reviewed this encounter.   [place], [place], [place] 03/26/20 1648

## 2020-03-26 NOTE — ED Triage Notes (Signed)
Patient c/o intermittent left shoulder pain x2 days. Sent from urgent care for further evaluation of abnormal EKG. Denies chest pain.

## 2020-03-26 NOTE — ED Triage Notes (Signed)
patient reports mid back and left shoulder blade has pain .  Pain for one week.  When lying down , no pain.    Denies sob

## 2020-03-26 NOTE — Discharge Instructions (Addendum)
Patient has already been seen multiple times for left scapular pain x1 week.  Does have intermittent nausea.  No chest pain or difficulty breathing.  Has failed conservative management, now has EKG changes as compared to previous read.

## 2020-03-27 ENCOUNTER — Emergency Department (HOSPITAL_COMMUNITY)
Admission: EM | Admit: 2020-03-27 | Discharge: 2020-03-27 | Disposition: A | Discharge status: Home / Self Care | Payer: Medicare HMO | Attending: Emergency Medicine | Admitting: Emergency Medicine

## 2020-03-27 ENCOUNTER — Encounter (HOSPITAL_COMMUNITY): Payer: Self-pay

## 2020-03-27 ENCOUNTER — Emergency Department (HOSPITAL_COMMUNITY): Payer: Medicare HMO

## 2020-03-27 DIAGNOSIS — I10 Essential (primary) hypertension: Secondary | ICD-10-CM

## 2020-03-27 DIAGNOSIS — I878 Other specified disorders of veins: Secondary | ICD-10-CM | POA: Diagnosis not present

## 2020-03-27 DIAGNOSIS — M25512 Pain in left shoulder: Secondary | ICD-10-CM

## 2020-03-27 DIAGNOSIS — K5730 Diverticulosis of large intestine without perforation or abscess without bleeding: Secondary | ICD-10-CM | POA: Diagnosis not present

## 2020-03-27 DIAGNOSIS — I712 Thoracic aortic aneurysm, without rupture: Secondary | ICD-10-CM | POA: Diagnosis not present

## 2020-03-27 DIAGNOSIS — I774 Celiac artery compression syndrome: Secondary | ICD-10-CM | POA: Diagnosis not present

## 2020-03-27 DIAGNOSIS — I700 Atherosclerosis of aorta: Secondary | ICD-10-CM | POA: Diagnosis not present

## 2020-03-27 MED ORDER — AMLODIPINE BESYLATE 5 MG PO TABS
10.0000 mg | ORAL_TABLET | Freq: Once | ORAL | Status: AC
  Administered 2020-03-27: 10 mg via ORAL
  Filled 2020-03-27: qty 2

## 2020-03-27 MED ORDER — IOHEXOL 350 MG/ML SOLN
100.0000 mL | Freq: Once | INTRAVENOUS | Status: AC | PRN
  Administered 2020-03-27: 80 mL via INTRAVENOUS

## 2020-03-27 MED ORDER — SODIUM CHLORIDE (PF) 0.9 % IJ SOLN
INTRAMUSCULAR | Status: AC
  Filled 2020-03-27: qty 50

## 2020-03-27 NOTE — ED Provider Notes (Signed)
[facility] DEPT Provider Note   CSN: [id number] Arrival date & time: 03/26/20  1844     History Chief Complaint  Patient presents with  . Shoulder Pain    Rebecca Estes is a 86 y.o. female.   Shoulder Pain Location:  Shoulder Shoulder location:  L shoulder Injury: no   Pain details:    Quality:  Sharp   Radiates to:  L shoulder and R shoulder   Severity:  Moderate   Timing:  Constant Dislocation: no   Prior injury to area:  No Relieved by:  None tried Worsened by:  Nothing Ineffective treatments:  None tried Associated symptoms: back pain and neck pain   Associated symptoms: no muscle weakness, no numbness, no stiffness, no swelling and no tingling        Past Medical History:  Diagnosis Date  . Arrhythmia   . Bleeding ulcer   . Cancer ([place])   . Hiatal hernia   . Hypertension   . Hyperthyroidism   . Osteoporosis   . Thoracic ascending aortic aneurysm ([place]) 11/06/2017   32mm diagnosed by echo 10/24/17.    Patient Active Problem List   Diagnosis Date Noted  . Ceruminosis, right 10/04/2019  . Back muscle spasm 02/15/2019  . Muscle cramp, nocturnal 12/27/2018  . Cough 10/12/2018  . Hernia of abdominal wall 10/12/2018  . Aortic atherosclerosis ([place]) 07/24/2018  . Chronic pain of both shoulders 07/24/2018  . BMI less than 19,adult 02/01/2018  . Difficulty chewing due to dentures 02/01/2018  . Thoracic ascending aortic aneurysm ([place]) 11/06/2017  . Palpitations 10/05/2017  . Seasonal allergies 09/18/2017  . Essential hypertension 05/26/2016  . Osteoporosis 05/26/2016  . Subclinical hyperthyroidism 05/26/2016    Past Surgical History:  Procedure Laterality Date  . ANTERIOR CERVICAL DECOMP/DISCECTOMY FUSION     x3  . BREAST BIOPSY Left 02/10/2009  . NECK SURGERY    . ROTATOR CUFF REPAIR     Left  . TUBAL LIGATION    . UMBILICAL HERNIA REPAIR       OB History   No obstetric history on file.     Family  History  Problem Relation Age of Onset  . Aneurysm Father 34       Brain  . Other Mother 38       Blood Infection  . Coronary artery disease Brother 68  . Stroke Brother 106  . Aneurysm Sister 54       Brain  . Cervical cancer Sister 44  . Kidney failure Sister 70       was on Dialysis  . Aneurysm Sister        Brain    Social History   Tobacco Use  . Smoking status: Never Smoker  . Smokeless tobacco: Never Used  Substance Use Topics  . Alcohol use: No  . Drug use: No    Home Medications Prior to Admission medications   Medication Sig Start Date End Date Taking? Authorizing Provider  alendronate (FOSAMAX) 70 MG tablet Take 1 tablet (70 mg total) by mouth once a week. Take with a full glass of water on an empty stomach. 10/08/19   Lucious Groves, DO  amLODipine (NORVASC) 10 MG tablet Take 1 tablet (10 mg total) by mouth daily. 12/31/19   Nicolette Bang, DO  aspirin EC 325 MG tablet Take 325 mg by mouth every morning.     [provider]  Cholecalciferol (VITAMIN D3) 25 MCG (1000 UT) CAPS Vitamin  D3 1,000 unit capsule  Take 1 capsule every day by oral route. 03/06/19   Harvie Heck, MD  diclofenac Sodium (VOLTAREN) 1 % GEL Apply 2 g topically 4 (four) times daily. 03/23/20   Tasia Catchings, Amy V, PA-C  fluticasone (FLONASE) 50 MCG/ACT nasal spray Place 1 spray into both nostrils daily. 10/08/19   Lucious Groves, DO  metoprolol succinate (TOPROL XL) 50 MG 24 hr tablet Take 1 tablet (50 mg total) by mouth daily. Take with or immediately following a meal. 10/08/19 10/07/20  Lucious Groves, DO  pravastatin (PRAVACHOL) 40 MG tablet Take 1 tablet (40 mg total) by mouth every evening. 10/08/19 10/07/20  Lucious Groves, DO  tiZANidine (ZANAFLEX) 2 MG tablet Take 1 tablet (2 mg total) by mouth 2 (two) times daily as needed for muscle spasms. 03/23/20   Ok Edwards, PA-C    Allergies    Methocarbamol  Review of Systems   Review of Systems  Musculoskeletal: Positive for back pain and neck  pain. Negative for stiffness.  All other systems reviewed and are negative.   Physical Exam Updated Vital Signs BP (!) 193/92   Pulse (!) 59   Temp 98.1 F (36.7 C) (Oral)   Resp 18   Ht 5\' 5"  (1.651 m)   Wt 49 kg   SpO2 100%   BMI 17.97 kg/m   Physical Exam Vitals and nursing note reviewed.  Constitutional:      Appearance: She is well-developed.  HENT:     Head: Normocephalic and atraumatic.     Nose: No congestion or rhinorrhea.     Mouth/Throat:     Mouth: Mucous membranes are moist.     Pharynx: Oropharynx is clear.  Eyes:     Pupils: Pupils are equal, round, and reactive to light.  Cardiovascular:     Rate and Rhythm: Normal rate and regular rhythm.  Pulmonary:     Effort: No respiratory distress.     Breath sounds: No stridor.  Abdominal:     General: There is no distension.  Musculoskeletal:        General: No swelling or tenderness. Normal range of motion.     Cervical back: Normal range of motion.  Skin:    General: Skin is warm and dry.  Neurological:     General: No focal deficit present.     Mental Status: She is alert.     ED Results / Procedures / Treatments   Labs (all labs ordered are listed, but only abnormal results are displayed) Labs Reviewed  CBC - Abnormal; Notable for the following components:      Result Value   WBC 3.8 (*)    All other components within normal limits  BASIC METABOLIC PANEL  TROPONIN I (HIGH SENSITIVITY)  TROPONIN I (HIGH SENSITIVITY)    EKG EKG Interpretation  Date/Time:  Thursday March 26 2020 18:59:06 EDT Ventricular Rate:  52 PR Interval:    QRS Duration: 86 QT Interval:  422 QTC Calculation: 393 R Axis:   31 Text Interpretation: Sinus rhythm RSR' in V1 or V2, right VCD or RVH Minimal ST elevation, anterior leads 12 Lead; [place] Confirmed by Madalyn Rob [phone]) on 03/26/2020 8:30:37 PM   Radiology DG Chest 2 View  Result Date: 03/26/2020 CLINICAL DATA:  86 year old female with left  shoulder pain. EXAM: CHEST - 2 VIEW COMPARISON:  Chest radiograph dated 10/05/2015 FINDINGS: Background of emphysema. No focal consolidation, pleural effusion or pneumothorax. The cardiac silhouette is within limits.  Atherosclerotic calcification of the aorta. No acute osseous pathology. IMPRESSION: 1. No active cardiopulmonary disease. 2. Emphysema. Electronically Signed   By: Anner Crete M.D.   On: 03/26/2020 19:39   CT Angio Chest/Abd/Pel for Dissection W and/or Wo Contrast  Result Date: 03/27/2020 CLINICAL DATA:  Left shoulder and back pain for 1 week, abnormal EKG, history of thoracic aortic aneurysm EXAM: CT ANGIOGRAPHY CHEST, ABDOMEN AND PELVIS TECHNIQUE: Noncontrast CT of the chest was performed prior to contrast injection. Multidetector CT imaging through the chest, abdomen and pelvis was performed using the standard protocol during bolus administration of intravenous contrast. Multiplanar reconstructed images and MIPs were obtained and reviewed to evaluate the vascular anatomy. CONTRAST:  21mL OMNIPAQUE IOHEXOL 350 MG/ML SOLN COMPARISON:  CT angiography 10/31/2019, 03/22/2018 FINDINGS: CTA CHEST FINDINGS Cardiovascular: Initial noncontrast CT examination of the chest redemonstrates a dilated ascending thoracic aorta measuring up to 4.2 cm with calcific plaque in the thoracic aorta but without abnormal hyperdense mural thickening or plaque displacement to suggest intramural hematoma. Postcontrast administration there is satisfactory opacification of the thoracic aorta. The aortic root is suboptimally assessed given cardiac pulsation artifact. No acute luminal abnormality. No periaortic stranding or hemorrhage. Ascending thoracic aorta measures up to 4.2 cm in size which is not significantly changed from comparison in March 2021 when measured independently at a similar level. Aorta returns to a normal caliber 2.8 cm by the level of the distal aortic arch and measuring only 2.6 cm at the level of the  descending thoracic aorta. Shared origin of the brachiocephalic and left common carotid artery with some mild tortuosity of the brachiocephalic vasculature itself. Central pulmonary arteries are normal caliber. No large central or lobar filling defects on this non tailored examination of the pulmonary arteries. Cardiac size is upper limits normal. Trace pericardial fluid likely within physiologic normal. No major venous abnormalities. Mediastinum/Nodes: No mediastinal fluid or gas. Heterogenous enlarged multinodular thyroid gland particularly involving the left lobe thyroid. Appearance is similar to comparison. No acute abnormality of the trachea or esophagus. No worrisome mediastinal, hilar or axillary adenopathy. Lungs/Pleura: No consolidation, features of edema, pneumothorax, or effusion. Mild atelectatic changes dependently. No suspicious pulmonary nodules or masses. Musculoskeletal: Slight dextrocurvature of the midthoracic spine with apex at the T6-7 level. Additional levocurvature at the thoracolumbar junction, apex at the L1 level. No acute fracture or vertebral body height loss is seen. Multilevel diffuse intervertebral disc height loss and discogenic endplate changes are mild-to-moderate. Additional mild arthrosis at the bilateral sternoclavicular joints. Mild additional degenerative changes in the bilateral shoulders including possible calcific tendinosis of the right shoulder. Review of the MIP images confirms the above findings. CTA ABDOMEN AND PELVIS FINDINGS VASCULAR Aorta: Normal caliber aorta without aneurysm, dissection, vasculitis or significant stenosis. Minimal atherosclerotic plaque is seen at the level of the diaphragmatic hiatus and bifurcation. Celiac: Minimal calcified ostial plaque resulting in mild narrowing. Vessel otherwise widely patent with normal opacification. No evidence of aneurysm, dissection or vasculitis. SMA: Patent without evidence of aneurysm, dissection, vasculitis or  significant stenosis. Renals: Single renal arteries bilaterally. Both renal arteries are patent without evidence of aneurysm, dissection, vasculitis, fibromuscular dysplasia or significant stenosis. IMA: Patent without acute abnormality or features of vasculitis. Inflow: No evidence of aneurysm, dissection or vasculitis. No significant plaque burden or stenosis. Proximal outflow vessels including the common femoral arteries and included portions of the superficial femoral and deep femoral arteries are widely patent without acute abnormality. Veins: Quite prominent and tortuous appearance of the gonadal/parametrial venous plexus.  No other significant venous abnormality. Review of the MIP images confirms the above findings. NON-VASCULAR Hepatobiliary: Likely mild diffuse fatty infiltration of the liver with sparing along the gallbladder fossa. Few subcentimeter hypertension foci in the liver too small to fully characterize on CT imaging but statistically likely benign. Gallbladder and biliary tree is unremarkable. No visible calcified gallstones. Pancreas: Unremarkable. No pancreatic ductal dilatation or surrounding inflammatory changes. Spleen: Normal in size. No concerning splenic lesions. Adrenals/Urinary Tract: Normal adrenal glands. Kidneys enhance symmetrically and uniformly. Multiple bilateral nonobstructing nephroliths. No obstructive urolithiasis or frank hydronephrosis. Urinary bladder is unremarkable. Stomach/Bowel: Distal esophagus, stomach and duodenal sweep are unremarkable. No small bowel wall thickening or dilatation. No evidence of obstruction. A normal appendix is visualized. No colonic dilatation or wall thickening. Scattered colonic diverticula without focal inflammation to suggest diverticulitis. Lymphatic: No suspicious or enlarged lymph nodes in the included lymphatic chains. Reproductive: Anteverted uterus. Tortuosity of the venous plexus, as detailed above, can be seen with pelvic congestion.  No other significant or concerning adnexal lesions. Other: No abdominopelvic free air or fluid. Musculoskeletal: Marked levocurvature of the thoracolumbar spine, as detailed above. Mild lateral listhesis L2 on 3 stable from prior. Multilevel discogenic and facet degenerative changes maximal L5-S1. Additional degenerative changes in the hips and pelvis. Including more moderate to severe change of the right hip with subchondral sclerosis, cystic change in mild acetabular protrusio. No worrisome osseous lesions. Review of the MIP images confirms the above findings. IMPRESSION: Vascular: 1. No evidence of acute aortic syndrome. 2. Stable ascending thoracic aortic aneurysm measuring up to 4.2 cm in size. Recommend annual imaging followup by CTA or MRA. This recommendation follows 2010 ACCF/AHA/AATS/ACR/ASA/SCA/SCAI/SIR/STS/SVM Guidelines for the Diagnosis and Management of Patients with Thoracic Aortic Disease. Circulation. 2010; 121: U272-Z366. Aortic aneurysm NOS (ICD10-I71.9) 3.  Aortic Atherosclerosis (ICD10-I70.0). 4. Mild plaque narrowing at the celiac axis origin. No other significant atheromatous stenoses. 5. No large central or lobar pulmonary arterial filling defects. 6. Prominent tortuous parametrial veins, can be seen in the setting of pelvic congestion syndrome. Correlate with clinical findings. Nonvascular: 1. No acute findings in the chest, abdomen or pelvis. 2. Bilateral nonobstructing nephroliths. 3. Colonic diverticulosis without evidence of diverticulitis. 4. Marked scoliotic curvature of the thoracolumbar spine with associated chest wall deformities. 5. Moderate to severe degenerative changes in the shoulders, hips and pelvis. Electronically Signed   By: Lovena Le M.D.   On: 03/27/2020 06:59    Procedures Procedures (including critical care time)  Medications Ordered in ED Medications  amLODipine (NORVASC) tablet 10 mg (10 mg Oral Given 03/27/20 0513)  iohexol (OMNIPAQUE) 350 MG/ML  injection 100 mL (80 mLs Intravenous Contrast Given 03/27/20 4403)    ED Course  I have reviewed the triage vital signs and the nursing notes.  Pertinent labs & imaging results that were available during my care of the patient were reviewed by me and considered in my medical decision making (see chart for details).    MDM Rules/Calculators/A&P                         Hypertensive without demonstrable MSK pain, possibly dissection, will ct for same. norvasc ordered as wll.  bp still slightly high, but no e/o end organ damage. Will fu w/ pcp for recheck.   Final Clinical Impression(s) / ED Diagnoses Final diagnoses:  Acute pain of left shoulder  Hypertension, unspecified type    Rx / DC Orders ED Discharge Orders  None       Aisia Correira, Corene Cornea, MD 03/28/20 0030

## 2020-03-31 ENCOUNTER — Encounter: Payer: Self-pay | Admitting: Internal Medicine

## 2020-03-31 ENCOUNTER — Other Ambulatory Visit: Payer: Self-pay

## 2020-03-31 ENCOUNTER — Ambulatory Visit (INDEPENDENT_AMBULATORY_CARE_PROVIDER_SITE_OTHER): Payer: Medicare HMO | Admitting: Internal Medicine

## 2020-03-31 VITALS — BP 183/95 | HR 68 | Temp 97.9°F | Resp 17 | Wt 110.0 lb

## 2020-03-31 DIAGNOSIS — I7121 Aneurysm of the ascending aorta, without rupture: Secondary | ICD-10-CM

## 2020-03-31 DIAGNOSIS — I7090 Unspecified atherosclerosis: Secondary | ICD-10-CM | POA: Diagnosis not present

## 2020-03-31 DIAGNOSIS — I10 Essential (primary) hypertension: Secondary | ICD-10-CM

## 2020-03-31 DIAGNOSIS — E0590 Thyrotoxicosis, unspecified without thyrotoxic crisis or storm: Secondary | ICD-10-CM

## 2020-03-31 DIAGNOSIS — I712 Thoracic aortic aneurysm, without rupture: Secondary | ICD-10-CM | POA: Diagnosis not present

## 2020-03-31 MED ORDER — HYDROCHLOROTHIAZIDE 25 MG PO TABS: 25.0000 mg | ORAL_TABLET | Freq: Every day | ORAL | 3 refills | Status: DC

## 2020-03-31 NOTE — Progress Notes (Signed)
Subjective:    Rebecca Estes - 86 y.o. female MRN [id number]  Date of birth: Jan 18, 1934  HPI  Rebecca Estes is here for follow up of chronic medical conditions.  Chronic HTN Disease Monitoring:  Home BP Monitoring - Reports BP 120-130/80s  Chest pain- no  Dyspnea- no Headache - no  Medications: Amlodipine 10 mg, Metoprolol XL 50 m  Compliance- yes Lightheadedness- no  Edema- no       Health Maintenance:  Health Maintenance Due  Topic Date Due   COVID-19 Vaccine (1) Never done   INFLUENZA VACCINE  03/08/2020    -  reports that she has never smoked. She has never used smokeless tobacco. - Review of Systems: Per HPI. - Past Medical History: Patient Active Problem List   Diagnosis Date Noted   Ceruminosis, right 10/04/2019   Back muscle spasm 02/15/2019   Muscle cramp, nocturnal 12/27/2018   Cough 10/12/2018   Hernia of abdominal wall 10/12/2018   Aortic atherosclerosis ([place]) 07/24/2018   Chronic pain of both shoulders 07/24/2018   BMI less than 19,adult 02/01/2018   Difficulty chewing due to dentures 02/01/2018   Thoracic ascending aortic aneurysm ([place]) 11/06/2017   Palpitations 10/05/2017   Seasonal allergies 09/18/2017   Essential hypertension 05/26/2016   Osteoporosis 05/26/2016   Subclinical hyperthyroidism 05/26/2016   - Medications: reviewed and updated   Objective:   Physical Exam BP (!) 185/90    Pulse 68    Temp 97.9 F (36.6 C) (Temporal)    Resp 17    Wt 110 lb (49.9 kg)    SpO2 98%    BMI 18.30 kg/m  Physical Exam Constitutional:      General: She is not in acute distress.    Appearance: She is not diaphoretic.  HENT:     Head: Normocephalic and atraumatic.  Eyes:     Conjunctiva/sclera: Conjunctivae normal.  Cardiovascular:     Rate and Rhythm: Normal rate and regular rhythm.     Heart sounds: Normal heart sounds. No murmur heard.   Pulmonary:     Effort: Pulmonary effort is normal. No  respiratory distress.     Breath sounds: Normal breath sounds.  Musculoskeletal:        General: Normal range of motion.  Skin:    General: Skin is warm and dry.  Neurological:     Mental Status: She is alert and oriented to person, place, and time.  Psychiatric:        Mood and Affect: Affect normal.        Judgment: Judgment normal.            Assessment & Plan:   1. Essential hypertension BP significantly elevated initially and on repeat. Hesitant to increase Metoprolol given pulse in 60s on both checks. Will add HCTZ 25 mg to regimen. Return in 2 weeks for BP check and labs. Will need to bring cuff for comparison as reported home measurements are much lower. Suspect error in cuff, as significantly elevated here and at ED visit within the past week.  - CBC with Differential - Comprehensive metabolic panel - Lipid Panel - hydrochlorothiazide (HYDRODIURIL) 25 MG tablet; Take 1 tablet (25 mg total) by mouth daily.  Dispense: 90 tablet; Refill: 3 - Basic Metabolic Panel; Future  2. Subclinical hyperthyroidism TSH low with result of 0.108 in Feb 2021 with normal T3/T4. Repeat.  - Thyroid Panel With TSH  3. Atherosclerosis Is on ASA and Pravastatin.  - Lipid Panel  4.  Thoracic ascending aortic aneurysm ([facility]) CTA obtained at ED on 8/20 to r/o dissection. Showed stable ascending thoracic aortic aneurysm measuring 4.2 cm. Will need annual imaging follow up.     Phill Myron, D.O. 03/31/2020, 8:59 AM Primary Care at [facility]

## 2020-04-01 ENCOUNTER — Ambulatory Visit: Payer: Medicare HMO | Admitting: Internal Medicine

## 2020-04-01 LAB — CBC WITH DIFFERENTIAL/PLATELET
Basophils Absolute: 0 10*3/uL (ref 0.0–0.2)
Basos: 1 %
EOS (ABSOLUTE): 0 10*3/uL (ref 0.0–0.4)
Eos: 1 %
Hematocrit: 39.3 % (ref 34.0–46.6)
Hemoglobin: 13.4 g/dL (ref 11.1–15.9)
Immature Grans (Abs): 0 10*3/uL (ref 0.0–0.1)
Immature Granulocytes: 0 %
Lymphocytes Absolute: 1.8 10*3/uL (ref 0.7–3.1)
Lymphs: 44 %
MCH: 31.2 pg (ref 26.6–33.0)
MCHC: 34.1 g/dL (ref 31.5–35.7)
MCV: 91 fL (ref 79–97)
Monocytes Absolute: 0.5 10*3/uL (ref 0.1–0.9)
Monocytes: 11 %
Neutrophils Absolute: 1.8 10*3/uL (ref 1.4–7.0)
Neutrophils: 43 %
Platelets: 207 10*3/uL (ref 150–450)
RBC: 4.3 x10E6/uL (ref 3.77–5.28)
RDW: 13.2 % (ref 11.7–15.4)
WBC: 4.1 10*3/uL (ref 3.4–10.8)

## 2020-04-01 LAB — LIPID PANEL
Chol/HDL Ratio: 2.5 ratio (ref 0.0–4.4)
Cholesterol, Total: 240 mg/dL — ABNORMAL HIGH (ref 100–199)
HDL: 96 mg/dL (ref >39)
LDL Chol Calc (NIH): 132 mg/dL — ABNORMAL HIGH (ref 0–99)
Triglycerides: 71 mg/dL (ref 0–149)
VLDL Cholesterol Cal: 12 mg/dL (ref 5–40)

## 2020-04-01 LAB — THYROID PANEL WITH TSH
Free Thyroxine Index: 1.5 (ref 1.2–4.9)
T3 Uptake Ratio: 21 % — ABNORMAL LOW (ref 24–39)
T4, Total: 7.2 ug/dL (ref 4.5–12.0)
TSH: 0.461 u[IU]/mL (ref 0.450–4.500)

## 2020-04-01 LAB — COMPREHENSIVE METABOLIC PANEL
ALT: 17 IU/L (ref 0–32)
AST: 23 IU/L (ref 0–40)
Albumin/Globulin Ratio: 1.9 (ref 1.2–2.2)
Albumin: 4.8 g/dL — ABNORMAL HIGH (ref 3.6–4.6)
Alkaline Phosphatase: 62 IU/L (ref 48–121)
BUN/Creatinine Ratio: 21 (ref 12–28)
BUN: 15 mg/dL (ref 8–27)
Bilirubin Total: 0.2 mg/dL (ref 0.0–1.2)
CO2: 22 mmol/L (ref 20–29)
Calcium: 10.2 mg/dL (ref 8.7–10.3)
Chloride: 106 mmol/L (ref 96–106)
Creatinine, Ser: 0.73 mg/dL (ref 0.57–1.00)
GFR calc Af Amer: 87 mL/min/{1.73_m2} (ref >59)
GFR calc non Af Amer: 76 mL/min/{1.73_m2} (ref >59)
Globulin, Total: 2.5 g/dL (ref 1.5–4.5)
Glucose: 92 mg/dL (ref 65–99)
Potassium: 4.6 mmol/L (ref 3.5–5.2)
Sodium: 144 mmol/L (ref 134–144)
Total Protein: 7.3 g/dL (ref 6.0–8.5)

## 2020-04-14 ENCOUNTER — Ambulatory Visit: Payer: Medicare HMO

## 2020-05-07 IMAGING — CT CT ANGIO CHEST
2 of 6 series · 13 of 36 positions shown · IV contrast (iopamidol)
Comparison: 03/22/2018

CLINICAL DATA: Follow-up of dilatation of the ascending thoracic
aorta.

EXAM:
CT ANGIOGRAPHY CHEST WITH CONTRAST
TECHNIQUE: Multidetector CT imaging of the chest was performed using the
standard protocol during bolus administration of intravenous
contrast. Multiplanar CT image reconstructions and MIPs were
obtained to evaluate the vascular anatomy.
CONTRAST:  75mL ALCD4I-6L1 IOPAMIDOL (ALCD4I-6L1) INJECTION 76%

[Series 5: cta thorax 2.00 bv36 s3 axial arterial · axial · arterial · 0.48mm/px · z∈[+1594,+1852]mm · 12 of 153 slices shown]
[im 12/153  lung]
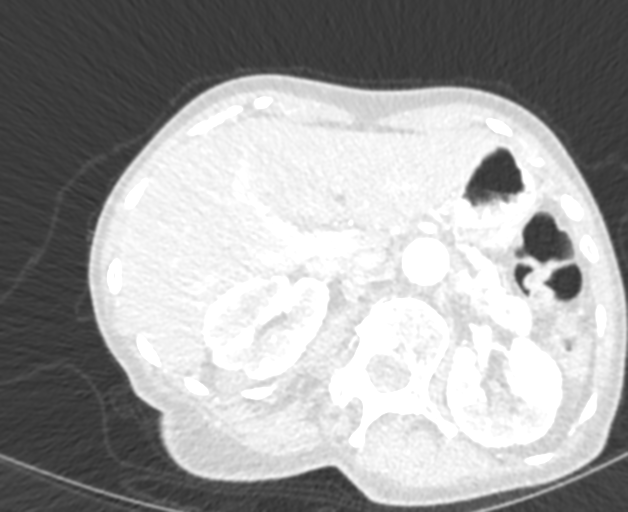
[im 24/153  mediastinal]
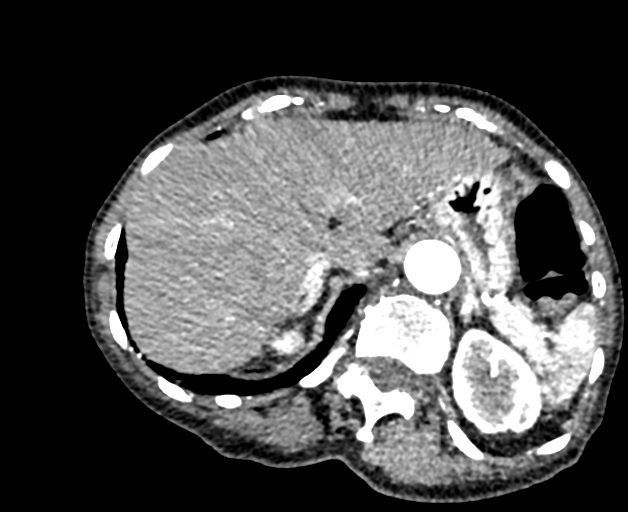
[im 36/153  lung]
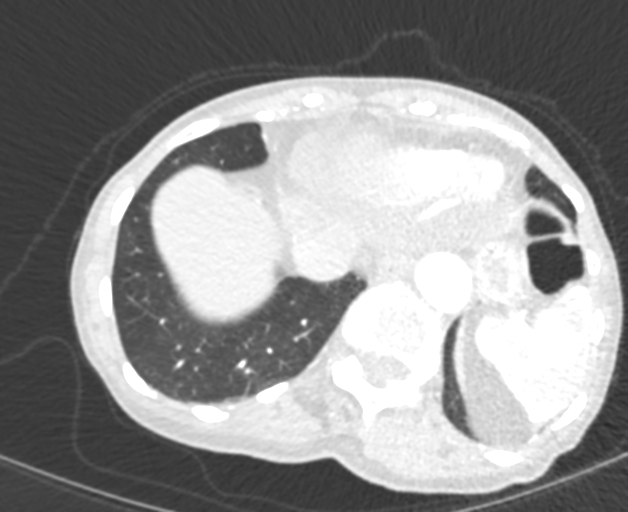
[im 47/153  mediastinal]
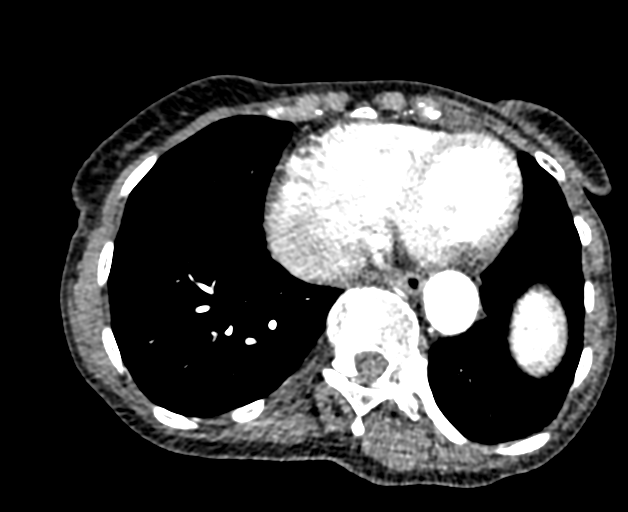
[im 59/153  lung]
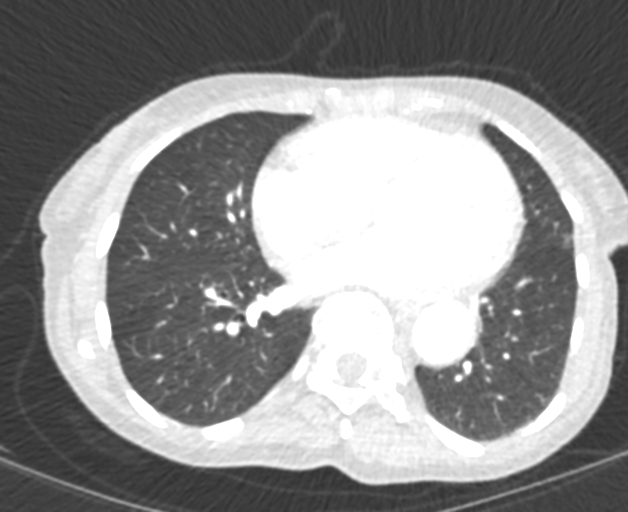
[im 71/153  mediastinal]
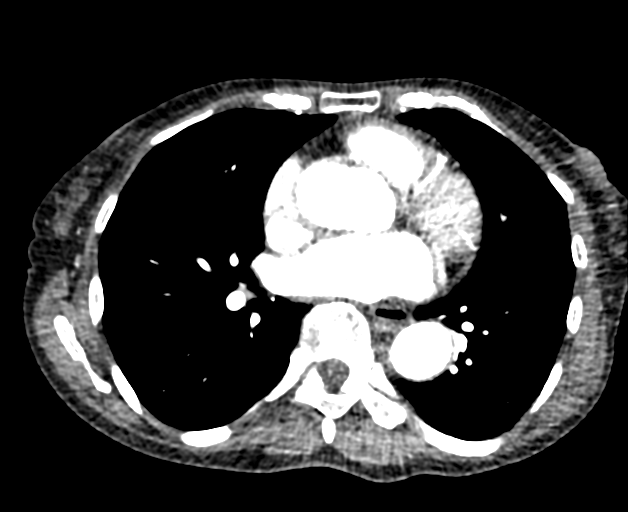
[im 82/153  lung]
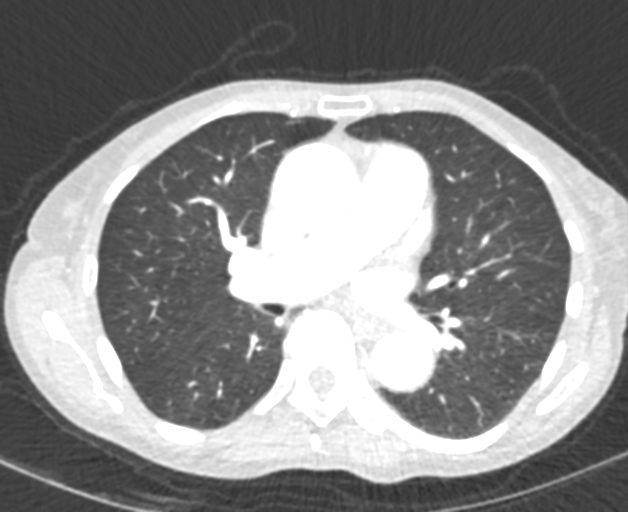
[im 94/153  mediastinal]
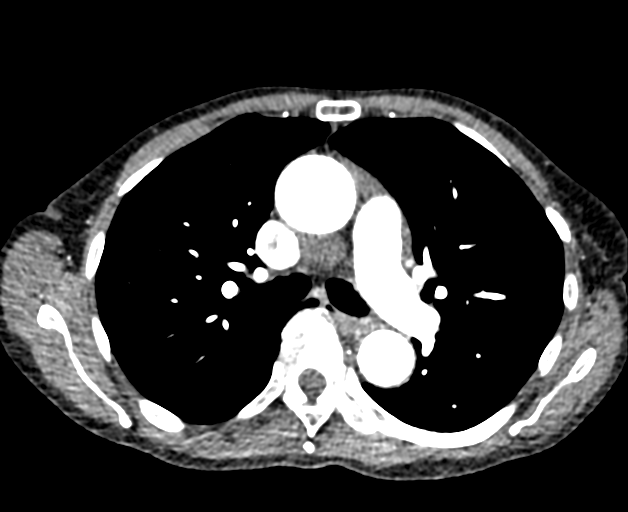
[im 106/153  lung]
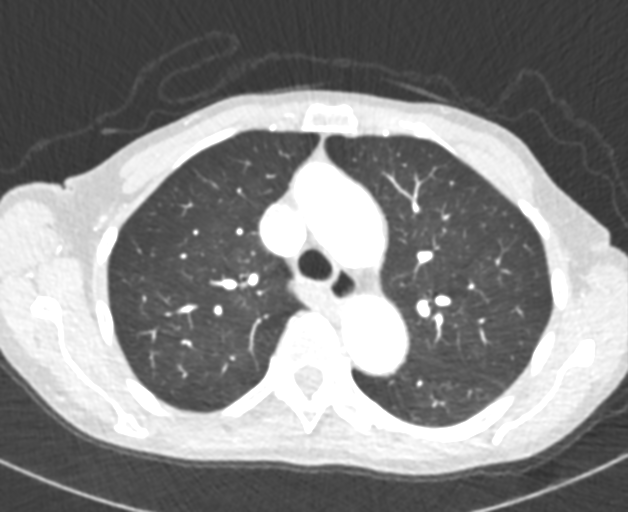
[im 117/153  mediastinal]
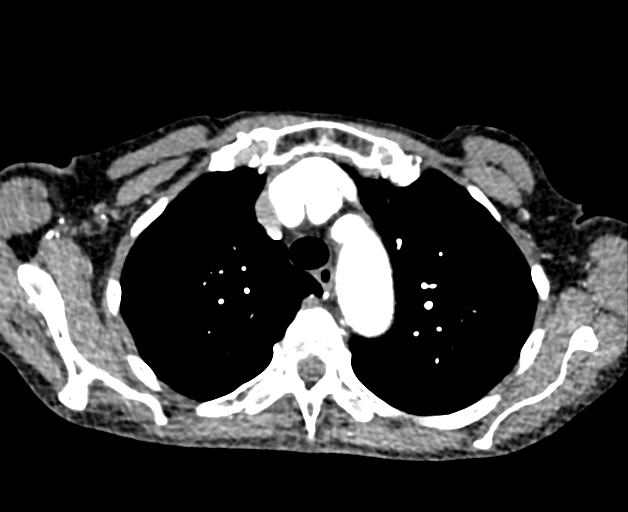
[im 129/153  lung]
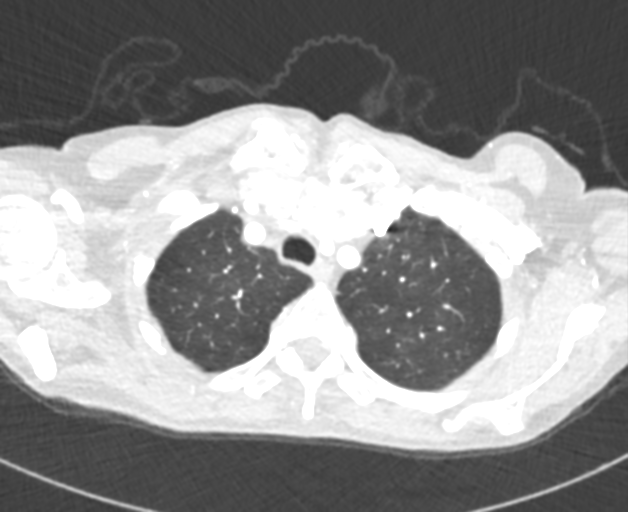
[im 141/153  mediastinal]
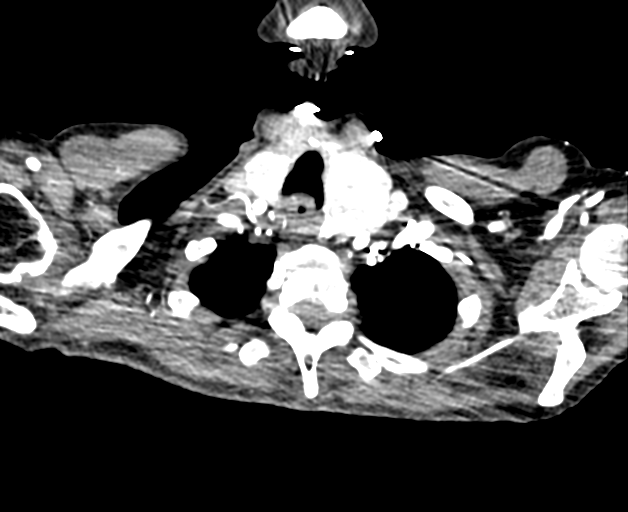

[Series 10: cta thorax 2.00 bv36 s3 cor st · coronal · 0.58mm/px · 1 of 122 slices shown]
[im 61/122  mediastinal]
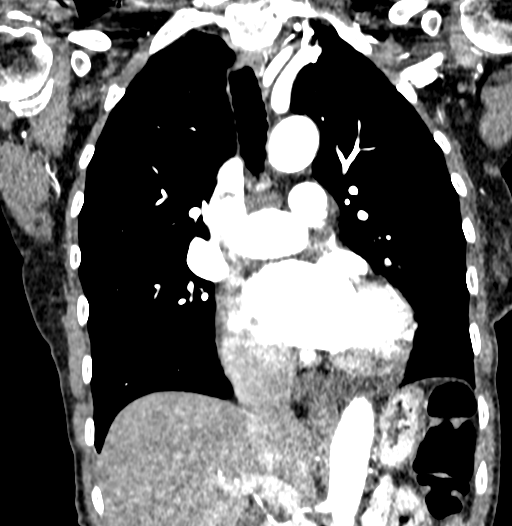

[13 of 36 positions shown; findings below may reference images not displayed]

FINDINGS: Cardiovascular: The aortic root measures 2.8-3.0 cm at the level of
the sinuses of Valsalva. The ascending thoracic aorta demonstrates
stable maximal diameter of 3.8 cm. The proximal arch measures 3.0 cm
and the distal arch 2.9 cm. The descending thoracic aorta measures
2.6 cm. There is no evidence of aortic dissection. Mild calcified
plaque is present at the level of the aortic arch. Proximal great
vessels demonstrate bovine branching anatomy and normal patency
along their visualized course.

The heart size is within normal limits. No pericardial fluid
identified. No significant calcified coronary artery plaque
identified. Central pulmonary arteries are normal in caliber.

Mediastinum/Nodes: Stable heterogeneous thyroid goiter with
enlargement of the left lobe that extends into the superior
mediastinum. Underlying thyroid nodules are suspected but cannot be
well delineated by CT. No lymphadenopathy in the mediastinum, hilar
regions or axillary regions.

Lungs/Pleura: There is no evidence of pulmonary edema,
consolidation, pneumothorax, nodule or pleural fluid.

Upper Abdomen: No acute abnormality.

Musculoskeletal: Stable thoracolumbar scoliosis.

Review of the MIP images confirms the above findings.
IMPRESSION: 1. Stable caliber of the thoracic aorta with maximal diameter of
cm in the ascending segment. The thoracic aorta is not actually
considered to be overtly aneurysmal until it reaches 4 cm in
diameter. It is debatable whether any routine CT imaging of the
aorta would need to be continued.
2. Stable heterogeneous thyroid goiter with enlargement of the left
lobe that extends into the superior mediastinum. Underlying thyroid
nodules are suspected but cannot be well delineated by CT.

## 2020-09-25 ENCOUNTER — Ambulatory Visit: Payer: Medicare HMO | Admitting: Internal Medicine

## 2020-10-20 ENCOUNTER — Ambulatory Visit
Admission: EM | Admit: 2020-10-20 | Discharge: 2020-10-20 | Disposition: A | Discharge status: Home / Self Care | Payer: Medicare HMO | Attending: Emergency Medicine | Admitting: Emergency Medicine

## 2020-10-20 ENCOUNTER — Encounter: Payer: Self-pay | Admitting: Emergency Medicine

## 2020-10-20 ENCOUNTER — Other Ambulatory Visit: Payer: Self-pay

## 2020-10-20 DIAGNOSIS — M25562 Pain in left knee: Secondary | ICD-10-CM

## 2020-10-20 DIAGNOSIS — I10 Essential (primary) hypertension: Secondary | ICD-10-CM

## 2020-10-20 DIAGNOSIS — M25462 Effusion, left knee: Secondary | ICD-10-CM

## 2020-10-20 MED ORDER — DICLOFENAC SODIUM 1 % EX GEL: 2.0000 g | Freq: Four times a day (QID) | CUTANEOUS | 0 refills | Status: DC

## 2020-10-20 NOTE — ED Provider Notes (Signed)
[facility]    CSN: [id number] Arrival date & time: 10/20/20  1019      History   Chief Complaint Chief Complaint  Patient presents with  . Knee Pain    HPI Rebecca Estes is a 87 y.o. female history of thoracic ascending aortic aneurysm, hypertension, osteoporosis, presenting today for evaluation of left knee pain and swelling.  Denies injury or fall.  Does report daily walks approximately 1.5 miles daily.  Denies history of problems with her knee in the past.  Reports history of osteoporosis, but is on Fosamax for this.  Denies history of easy fractures.  HPI  Past Medical History:  Diagnosis Date  . Arrhythmia   . Bleeding ulcer   . Cancer ([place])   . Hiatal hernia   . Hypertension   . Hyperthyroidism   . Osteoporosis   . Thoracic ascending aortic aneurysm ([place]) 11/06/2017   54mm diagnosed by echo 10/24/17.    Patient Active Problem List   Diagnosis Date Noted  . Ceruminosis, right 10/04/2019  . Back muscle spasm 02/15/2019  . Muscle cramp, nocturnal 12/27/2018  . Cough 10/12/2018  . Hernia of abdominal wall 10/12/2018  . Aortic atherosclerosis ([place]) 07/24/2018  . Chronic pain of both shoulders 07/24/2018  . BMI less than 19,adult 02/01/2018  . Difficulty chewing due to dentures 02/01/2018  . Thoracic ascending aortic aneurysm ([place]) 11/06/2017  . Palpitations 10/05/2017  . Seasonal allergies 09/18/2017  . Essential hypertension 05/26/2016  . Osteoporosis 05/26/2016  . Subclinical hyperthyroidism 05/26/2016    Past Surgical History:  Procedure Laterality Date  . ANTERIOR CERVICAL DECOMP/DISCECTOMY FUSION     x3  . BREAST BIOPSY Left 02/10/2009  . NECK SURGERY    . ROTATOR CUFF REPAIR     Left  . TUBAL LIGATION    . UMBILICAL HERNIA REPAIR      OB History   No obstetric history on file.      Home Medications    Prior to Admission medications   Medication Sig Start Date End Date Taking? Authorizing Provider  diclofenac Sodium  (VOLTAREN) 1 % GEL Apply 2 g topically 4 (four) times daily. 10/20/20  Yes Doniven Vanpatten C, PA-C  alendronate (FOSAMAX) 70 MG tablet Take 1 tablet (70 mg total) by mouth once a week. Take with a full glass of water on an empty stomach. 10/08/19   Lucious Groves, DO  amLODipine (NORVASC) 10 MG tablet Take 1 tablet (10 mg total) by mouth daily. 12/31/19   Nicolette Bang, DO  aspirin EC 325 MG tablet Take 325 mg by mouth every morning.     [provider]  Cholecalciferol (VITAMIN D3) 25 MCG (1000 UT) CAPS Vitamin D3 1,000 unit capsule  Take 1 capsule every day by oral route. 03/06/19   Harvie Heck, MD  hydrochlorothiazide (HYDRODIURIL) 25 MG tablet Take 1 tablet (25 mg total) by mouth daily. 03/31/20   Nicolette Bang, DO  metoprolol succinate (TOPROL XL) 50 MG 24 hr tablet Take 1 tablet (50 mg total) by mouth daily. Take with or immediately following a meal. 10/08/19 10/07/20  Lucious Groves, DO  pravastatin (PRAVACHOL) 40 MG tablet Take 1 tablet (40 mg total) by mouth every evening. 10/08/19 10/07/20  Lucious Groves, DO  tiZANidine (ZANAFLEX) 2 MG tablet Take 1 tablet (2 mg total) by mouth 2 (two) times daily as needed for muscle spasms. 03/23/20   Ok Edwards, PA-C    Family History Family History  Problem Relation Age of Onset  . Aneurysm Father 23       Brain  . Other Mother 95       Blood Infection  . Coronary artery disease Brother 10  . Stroke Brother 32  . Aneurysm Sister 63       Brain  . Cervical cancer Sister 72  . Kidney failure Sister 70       was on Dialysis  . Aneurysm Sister        Brain    Social History Social History   Tobacco Use  . Smoking status: Never Smoker  . Smokeless tobacco: Never Used  Substance Use Topics  . Alcohol use: No  . Drug use: No     Allergies   Patient has no known allergies.   Review of Systems Review of Systems  Constitutional: Negative for fatigue and fever.  HENT: Negative for mouth sores.   Eyes: Negative  for visual disturbance.  Respiratory: Negative for shortness of breath.   Cardiovascular: Negative for chest pain.  Gastrointestinal: Negative for abdominal pain, nausea and vomiting.  Musculoskeletal: Positive for arthralgias and joint swelling.  Skin: Negative for color change, rash and wound.  Neurological: Negative for dizziness, weakness, light-headedness and headaches.     Physical Exam Triage Vital Signs ED Triage Vitals  Enc Vitals Group     BP      Pulse      Resp      Temp      Temp src      SpO2      Weight      Height      Head Circumference      Peak Flow      Pain Score      Pain Loc      Pain Edu?      Excl. in [place]?    No data found.  Updated Vital Signs BP (!) 186/73 (BP Location: Left Arm)   Pulse (!) 54   Temp 97.6 F (36.4 C) (Oral)   Resp 15   SpO2 100%   Visual Acuity Right Eye Distance:   Left Eye Distance:   Bilateral Distance:    Right Eye Near:   Left Eye Near:    Bilateral Near:     Physical Exam Vitals and nursing note reviewed.  Constitutional:      Appearance: She is well-developed.     Comments: No acute distress  HENT:     Head: Normocephalic and atraumatic.     Nose: Nose normal.  Eyes:     Conjunctiva/sclera: Conjunctivae normal.  Cardiovascular:     Rate and Rhythm: Normal rate.  Pulmonary:     Effort: Pulmonary effort is normal. No respiratory distress.  Abdominal:     General: There is no distension.  Musculoskeletal:        General: Normal range of motion.     Cervical back: Neck supple.     Comments: Left knee: Moderate swelling about left knee compared to right, no erythema or warmth, full active range of motion, tenderness to palpation along lateral joint line, nontender along patella, infrapatellar area suprapatellar area or medial joint line  Skin:    General: Skin is warm and dry.  Neurological:     Mental Status: She is alert and oriented to person, place, and time.      UC Treatments / Results   Labs (all labs ordered are listed, but only abnormal results are displayed) Labs  Reviewed - No data to display  EKG   Radiology No results found.  Procedures Procedures (including critical care time)  Medications Ordered in UC Medications - No data to display  Initial Impression / Assessment and Plan / UC Course  I have reviewed the triage vital signs and the nursing notes.  Pertinent labs & imaging results that were available during my care of the patient were reviewed by me and considered in my medical decision making (see chart for details).     Suspect likely underlying osteoarthritis, no mechanism of injury, deferring imaging.  Given patient's osteoporosis status deferring steroids.  Recommending Tylenol and topical diclofenac ice and elevation.  Continue to monitor,Discussed strict return precautions. Patient verbalized understanding and is agreeable with plan.   Discussed elevated blood pressure with patient, rechecked and was similarly elevated in 021J systolic, advised patient to continue to take her blood pressure medicine as prescribed with close monitoring, follow-up with PCP if continuing to stay high.   Final Clinical Impressions(s) / UC Diagnoses   Final diagnoses:  Pain and swelling of left knee     Discharge Instructions     Please use Tylenol 500 to 650 mg every 4-6 hours Please use diclofenac gel topically on knee 4 times daily Ice and elevate May use Ace wrap for compression to further help with swelling Follow-up if not improving or worsening    ED Prescriptions    Medication Sig Dispense Auth. Provider   diclofenac Sodium (VOLTAREN) 1 % GEL Apply 2 g topically 4 (four) times daily. 150 g Aoife Bold, [place] C, PA-C     PDMP not reviewed this encounter.   Janith Lima, PA-C 10/20/20 1230

## 2020-10-20 NOTE — ED Triage Notes (Signed)
Patient c/o LFT knee pain x 2 weeks.   Patient denies any fall or trauma.   Patient endorses increased pain while sitting and trying to move the knee.   Patient states onset of symptoms began in the morning.   Patient has tried massage w/ no relief of symptoms.

## 2020-10-20 NOTE — Discharge Instructions (Signed)
Please use Tylenol 500 to 650 mg every 4-6 hours Please use diclofenac gel topically on knee 4 times daily Ice and elevate May use Ace wrap for compression to further help with swelling Follow-up if not improving or worsening

## 2020-11-02 ENCOUNTER — Ambulatory Visit (INDEPENDENT_AMBULATORY_CARE_PROVIDER_SITE_OTHER): Payer: Medicare HMO | Admitting: Internal Medicine

## 2020-11-02 ENCOUNTER — Ambulatory Visit (HOSPITAL_COMMUNITY)
Admission: RE | Admit: 2020-11-02 | Discharge: 2020-11-02 | Disposition: A | Discharge status: Home / Self Care | Payer: Medicare HMO | Source: Ambulatory Visit | Attending: Internal Medicine | Admitting: Internal Medicine

## 2020-11-02 ENCOUNTER — Encounter: Payer: Self-pay | Admitting: Internal Medicine

## 2020-11-02 ENCOUNTER — Other Ambulatory Visit: Payer: Self-pay

## 2020-11-02 VITALS — BP 162/92 | HR 80 | Resp 16 | Wt 111.8 lb

## 2020-11-02 DIAGNOSIS — M25462 Effusion, left knee: Secondary | ICD-10-CM

## 2020-11-02 DIAGNOSIS — M25469 Effusion, unspecified knee: Secondary | ICD-10-CM

## 2020-11-02 NOTE — Progress Notes (Signed)
Lower extremity venous LT study completed.  Preliminary results relayed to Know[place], DO.   See CV Proc for preliminary results report.   Darlin Coco, RDMS, RVT

## 2020-11-02 NOTE — Progress Notes (Signed)
  Subjective:    Rebecca Estes - 87 y.o. female MRN [id number]  Date of birth: Dec 10, 1933  HPI  Rebecca Estes is here for acute concerns. Reports left knee swelling after driving 3+ hours to [facility] without stopping to attend her sister's funeral. Reports that swelling seemed to improve this past week after she rubbed some topical pain relief onto the knee. She denies edema elsewhere in the leg, no calf tenderness, no SOB, chest pain. She also wrapped the area and it helped with swelling.      Health Maintenance:  Health Maintenance Due  Topic Date Due  . COVID-19 Vaccine (3 - Booster for Pfizer series) 04/02/2020    -  reports that she has never smoked. She has never used smokeless tobacco. - Review of Systems: Per HPI. - Past Medical History: Patient Active Problem List   Diagnosis Date Noted  . Ceruminosis, right 10/04/2019  . Back muscle spasm 02/15/2019  . Muscle cramp, nocturnal 12/27/2018  . Cough 10/12/2018  . Hernia of abdominal wall 10/12/2018  . Aortic atherosclerosis ([place]) 07/24/2018  . Chronic pain of both shoulders 07/24/2018  . BMI less than 19,adult 02/01/2018  . Difficulty chewing due to dentures 02/01/2018  . Thoracic ascending aortic aneurysm ([place]) 11/06/2017  . Palpitations 10/05/2017  . Seasonal allergies 09/18/2017  . Essential hypertension 05/26/2016  . Osteoporosis 05/26/2016  . Subclinical hyperthyroidism 05/26/2016   - Medications: reviewed and updated   Objective:   Physical Exam BP (!) 162/92   Pulse 80   Resp 16   Wt 111 lb 12.8 oz (50.7 kg)   SpO2 99%   BMI 18.60 kg/m  Physical Exam Constitutional:      General: She is not in acute distress.    Appearance: She is not diaphoretic.  Cardiovascular:     Rate and Rhythm: Normal rate.  Pulmonary:     Effort: Pulmonary effort is normal. No respiratory distress.  Musculoskeletal:        General: Normal range of motion.     Comments: Has edema present across the anterior  and posterior aspect of her left knee as well as upper part of lower leg. No palpable cords. No calf tenderness. Negative Homan's sign. No erythema or increased warmth.   Skin:    General: Skin is warm and dry.  Neurological:     Mental Status: She is alert and oriented to person, place, and time.  Psychiatric:        Mood and Affect: Affect normal.        Judgment: Judgment normal.            Assessment & Plan:   1. Knee swelling Suspect most likely related to some edema from underlying OA given age exacerbated by long road trip. However, given risk of age with prolonged period of immobility with recent travel and edema present on exam, will obtain DVT sono. Patient currently stable for continued outpatient management. Discussed reasons to seek emergency care. If sono negative and edema does not continue to resolve with compression, would consider SM referral.  - VAS [place] LOWER EXTREMITY VENOUS (DVT); Future   Phill Myron, D.O. 11/02/2020, 3:28 PM Primary Care at [facility]

## 2020-11-02 NOTE — Progress Notes (Signed)
Pt states she has been having pain behind her right ear

## 2020-11-03 ENCOUNTER — Telehealth: Payer: Self-pay | Admitting: Internal Medicine

## 2020-11-03 NOTE — Telephone Encounter (Signed)
Patient arrived to [facility]. With BP reading on paper 122/80. She was told her BP was elevated. Advised that BP reading was good. Scheduled an appt to return for OV with Dr. Juleen China in April since she concerns yesterday was r/t leg swelling .  She states her [place] was done and it was normal. She also stated she went for a 1 mile walk today.   Given a reminder card for her appt. Advised to take BP medication as directed. Patient verbalized understanding.

## 2020-11-04 ENCOUNTER — Telehealth: Payer: Self-pay | Admitting: Internal Medicine

## 2020-11-04 NOTE — Telephone Encounter (Signed)
Pt at the front desk requesting medications to be sent to [facility] on [address].  alendronate (FOSAMAX) 70 MG tablet  amLODipine (NORVASC) 10 MG tablet  aspirin EC 325 MG tablet  Cholecalciferol (VITAMIN D3) 25 MCG (1000 UT) CAPS  diclofenac Sodium (VOLTAREN) 1 % GEL  hydrochlorothiazide (HYDRODIURIL) 25 MG tablet  metoprolol succinate (TOPROL XL) 50 MG 24 hr tablet(Expired)  pravastatin (PRAVACHOL) 40 MG tablet(Expired)  tiZANidine (ZANAFLEX) 2 MG tablet

## 2020-11-06 ENCOUNTER — Encounter: Payer: Self-pay | Admitting: Family

## 2020-11-06 ENCOUNTER — Other Ambulatory Visit: Payer: Self-pay | Admitting: *Deleted

## 2020-11-06 ENCOUNTER — Other Ambulatory Visit: Payer: Self-pay

## 2020-11-06 ENCOUNTER — Ambulatory Visit (INDEPENDENT_AMBULATORY_CARE_PROVIDER_SITE_OTHER): Payer: Medicare HMO | Admitting: Family

## 2020-11-06 DIAGNOSIS — I10 Essential (primary) hypertension: Secondary | ICD-10-CM | POA: Diagnosis not present

## 2020-11-06 DIAGNOSIS — M810 Age-related osteoporosis without current pathological fracture: Secondary | ICD-10-CM

## 2020-11-06 MED ORDER — PRAVASTATIN SODIUM 40 MG PO TABS: 40.0000 mg | ORAL_TABLET | Freq: Every evening | ORAL | 0 refills | Status: DC

## 2020-11-06 MED ORDER — AMLODIPINE BESYLATE 10 MG PO TABS: 10.0000 mg | ORAL_TABLET | Freq: Every day | ORAL | 0 refills | Status: DC

## 2020-11-06 MED ORDER — HYDROCHLOROTHIAZIDE 50 MG PO TABS: 50.0000 mg | ORAL_TABLET | Freq: Every day | ORAL | 0 refills | Status: DC

## 2020-11-06 NOTE — Telephone Encounter (Signed)
Pt is stating that she needs only this medication and that she picked up her other one that Dr. Juleen China has prescribed for her.  Explained all her other medications were prescribed by a different doctors she is in need of; amLODipine (NORVASC) 10 MG tablet [[id number]] She has been out of this medication for a few days.  Pt request urgent refill.   [place] (SE), [place] [facility] [place]

## 2020-11-06 NOTE — Telephone Encounter (Deleted)
ERROR

## 2020-11-06 NOTE — Progress Notes (Signed)
Patient ID: Rebecca Estes, female    DOB: Jan 23, 1934  MRN: [id number]  CC: Hypertension Follow-Up  Subjective: Rebecca Estes is a 87 y.o. female who presents for hypertension follow-up. She is accompanied by her husband.  Her concerns today include:   1. HYPERTENSION FOLLOW-UP: 03/31/2020 per MD note: BP significantly elevated initially and on repeat. Hesitant to increase Metoprolol given pulse in 60s on both checks. Will add HCTZ 25 mg to regimen. Return in 2 weeks for BP check and labs. Will need to bring cuff for comparison as reported home measurements are much lower. Suspect error in cuff, as significantly elevated here and at ED visit within the past week.   11/06/2020: Reports she is unsure of the name of the blood pressure medications she is taking. Believes it is Hydrochlorothiazide. Ran out of Amlodipine 1 day ago. Requesting refills.   Med Adherence: [x]  Yes    []  No Medication side effects: []  Yes    [x]  No Adherence with salt restriction (low-salt diet): [x]  Yes    []  No Exercise: Yes []  No [x]   Home Monitoring?: [x]  Yes    []  No Monitoring Frequency: [x]  Yes    []  No Home BP results range: [x]  Yes, 130's-140's/80's-90's Smoking []  Yes [x]  No SOB? []  Yes    [x]  No Chest Pain?: []  Yes    [x]  No  Leg swelling?: []  Yes    [x]  No Headaches?: []  Yes    [x]  No Dizziness? Sometimes   Patient Active Problem List   Diagnosis Date Noted  . Ceruminosis, right 10/04/2019  . Back muscle spasm 02/15/2019  . Muscle cramp, nocturnal 12/27/2018  . Cough 10/12/2018  . Hernia of abdominal wall 10/12/2018  . Aortic atherosclerosis ([place]) 07/24/2018  . Chronic pain of both shoulders 07/24/2018  . BMI less than 19,adult 02/01/2018  . Difficulty chewing due to dentures 02/01/2018  . Thoracic ascending aortic aneurysm ([place]) 11/06/2017  . Palpitations 10/05/2017  . Seasonal allergies 09/18/2017  . Essential hypertension 05/26/2016  . Osteoporosis 05/26/2016  . Subclinical  hyperthyroidism 05/26/2016     Current Outpatient Medications on File Prior to Visit  Medication Sig Dispense Refill  . alendronate (FOSAMAX) 70 MG tablet Take 1 tablet (70 mg total) by mouth once a week. Take with a full glass of water on an empty stomach. 12 tablet 3  . aspirin EC 325 MG tablet Take 325 mg by mouth every morning.     . Cholecalciferol (VITAMIN D3) 25 MCG (1000 UT) CAPS Vitamin D3 1,000 unit capsule  Take 1 capsule every day by oral route. 60 capsule 2  . diclofenac Sodium (VOLTAREN) 1 % GEL Apply 2 g topically 4 (four) times daily. 150 g 0  . pravastatin (PRAVACHOL) 40 MG tablet Take 1 tablet (40 mg total) by mouth every evening. 30 tablet 0  . tiZANidine (ZANAFLEX) 2 MG tablet Take 1 tablet (2 mg total) by mouth 2 (two) times daily as needed for muscle spasms. 10 tablet 0  . metoprolol succinate (TOPROL XL) 50 MG 24 hr tablet Take 1 tablet (50 mg total) by mouth daily. Take with or immediately following a meal. 90 tablet 3   No current facility-administered medications on file prior to visit.    No Known Allergies  Social History   Socioeconomic History  . Marital status: Married    Spouse name: Not on file  . Number of children: 5  . Years of education: 9  . Highest education level: Not  on file  Occupational History  . Not on file  Tobacco Use  . Smoking status: Never Smoker  . Smokeless tobacco: Never Used  Substance and Sexual Activity  . Alcohol use: No  . Drug use: No  . Sexual activity: Not on file  Other Topics Concern  . Not on file  Social History Narrative   Denies abuse and feels safe at home.    Social Determinants of Health   Financial Resource Strain: Not on file  Food Insecurity: Not on file  Transportation Needs: Not on file  Physical Activity: Not on file  Stress: Not on file  Social Connections: Not on file  Intimate Partner Violence: Not on file    Family History  Problem Relation Age of Onset  . Aneurysm Father 62        Brain  . Other Mother 45       Blood Infection  . Coronary artery disease Brother 57  . Stroke Brother 4  . Aneurysm Sister 74       Brain  . Cervical cancer Sister 72  . Kidney failure Sister 76       was on Dialysis  . Aneurysm Sister        Brain    Past Surgical History:  Procedure Laterality Date  . ANTERIOR CERVICAL DECOMP/DISCECTOMY FUSION     x3  . BREAST BIOPSY Left 02/10/2009  . NECK SURGERY    . ROTATOR CUFF REPAIR     Left  . TUBAL LIGATION    . UMBILICAL HERNIA REPAIR      ROS: Review of Systems Negative except as stated above  PHYSICAL EXAM: BP (!) 171/96 (BP Location: Left Arm, Patient Position: Sitting)   Pulse 67   Wt 109 lb 9.6 oz (49.7 kg)   SpO2 100%   BMI 18.24 kg/m   Physical Exam HENT:     Head: Normocephalic and atraumatic.  Eyes:     Extraocular Movements: Extraocular movements intact.     Conjunctiva/sclera: Conjunctivae normal.     Pupils: Pupils are equal, round, and reactive to light.  Cardiovascular:     Rate and Rhythm: Normal rate and regular rhythm.     Pulses: Normal pulses.     Heart sounds: Normal heart sounds.  Pulmonary:     Effort: Pulmonary effort is normal.     Breath sounds: Normal breath sounds.  Musculoskeletal:     Cervical back: Normal range of motion and neck supple.  Neurological:     General: No focal deficit present.     Mental Status: She is alert and oriented to person, place, and time.  Psychiatric:        Mood and Affect: Mood normal.        Behavior: Behavior normal.    ASSESSMENT AND PLAN: 1. Essential hypertension: - Blood pressure not at goal during today's visit. Patient asymptomatic without chest pressure, chest pain, palpitations, shortness of breath, and worst headache of life. - Endorses home blood pressures are normal. Does not have home blood pressure logs today in office. On previous visit August 2021 concern for error in home blood pressure cuff as there was also report of normal home  blood pressure readings at that time. - Increase Hydrochlorothiazide from 25 mg to 50 mg daily.  - Continue Amlodipine as prescribed.  - Follow-up with primary provider in 1 week or sooner if needed for blood pressure check. - BMP to evaluate kidney function and electrolyte balance. - hydrochlorothiazide (  HYDRODIURIL) 50 MG tablet; Take 1 tablet (50 mg total) by mouth daily.  Dispense: 90 tablet; Refill: 0 - amLODipine (NORVASC) 10 MG tablet; Take 1 tablet (10 mg total) by mouth daily.  Dispense: 90 tablet; Refill: 0 - Basic Metabolic Panel    Patient was given the opportunity to ask questions.  Patient verbalized understanding of the plan and was able to repeat key elements of the plan. Patient was given clear instructions to go to Emergency Department or return to medical center if symptoms don't improve, worsen, or new problems develop.The patient verbalized understanding.   No orders of the defined types were placed in this encounter.    Requested Prescriptions   Signed Prescriptions Disp Refills  . hydrochlorothiazide (HYDRODIURIL) 50 MG tablet 90 tablet 0    Sig: Take 1 tablet (50 mg total) by mouth daily.  Marland Kitchen amLODipine (NORVASC) 10 MG tablet 90 tablet 0    Sig: Take 1 tablet (10 mg total) by mouth daily.    Return in about 1 week (around 11/13/2020) for Follow-Up Dr. Juleen China hypertension .  Camillia Herter, NP

## 2020-11-06 NOTE — Telephone Encounter (Signed)
Medications were not addressed at last OV.  Unable to refill medications. Patient last OV addressing medication and BP was August, 2021.  Patient was able to be working for [facility] today with NP, Durene Fruits.

## 2020-11-06 NOTE — Progress Notes (Signed)
Medication refill Not sure what BP meds regimen she should be following

## 2020-11-07 LAB — BASIC METABOLIC PANEL
BUN/Creatinine Ratio: 24 (ref 12–28)
BUN: 20 mg/dL (ref 8–27)
CO2: 24 mmol/L (ref 20–29)
Calcium: 10.5 mg/dL — ABNORMAL HIGH (ref 8.7–10.3)
Chloride: 100 mmol/L (ref 96–106)
Creatinine, Ser: 0.84 mg/dL (ref 0.57–1.00)
Glucose: 81 mg/dL (ref 65–99)
Potassium: 4.4 mmol/L (ref 3.5–5.2)
Sodium: 142 mmol/L (ref 134–144)
eGFR: 68 mL/min/{1.73_m2} (ref >59)

## 2020-11-09 ENCOUNTER — Ambulatory Visit: Payer: Medicare HMO | Admitting: Internal Medicine

## 2020-11-25 ENCOUNTER — Ambulatory Visit: Payer: Medicare HMO | Admitting: Internal Medicine

## 2021-02-16 NOTE — Progress Notes (Signed)
Patient did not show for appointment.   

## 2021-02-17 ENCOUNTER — Encounter: Payer: Medicare HMO | Admitting: Family

## 2021-02-25 ENCOUNTER — Ambulatory Visit (INDEPENDENT_AMBULATORY_CARE_PROVIDER_SITE_OTHER): Payer: Medicare HMO | Admitting: Family

## 2021-02-25 ENCOUNTER — Other Ambulatory Visit: Payer: Self-pay

## 2021-02-25 ENCOUNTER — Encounter: Payer: Self-pay | Admitting: Family

## 2021-02-25 VITALS — BP 143/88 | HR 78 | Temp 97.8°F | Resp 15 | Ht 64.57 in | Wt 110.0 lb

## 2021-02-25 DIAGNOSIS — I10 Essential (primary) hypertension: Secondary | ICD-10-CM | POA: Diagnosis not present

## 2021-02-25 DIAGNOSIS — K5900 Constipation, unspecified: Secondary | ICD-10-CM

## 2021-02-25 NOTE — Progress Notes (Signed)
Rebecca Estes, is a 87 y.o. female  XTK:[id number]  DJM:[id number]  DOB - 05/23/1934  Subjective:  Chief Complaint and HPI:  Rebecca Estes is a 19 year African American female who presents to the clinic this morning with complaints of constipation. Patient says that for the past 6 months she has been experiencing intermittent constipation. Bought OTC stools softeners, which is help her well. LBM was this morning. Endorses not drinking water because of its taste. Walks 2 miles everyday. Tries to eat vegetables everyday. Denies N/V, CP, SOB or any other symptom than above.  ED/Hospital notes reviewed.    ROS:   Constitutional:  No f/c, No night sweats, No unexplained weight loss. EENT:  No vision changes, No blurry vision, No hearing changes. No mouth, throat, or ear problems.  Respiratory: No cough, No SOB Cardiac: No CP, no palpitations GI:  No abd pain, No N/V/D. Constipation. GU: No Urinary s/sx Musculoskeletal: No joint pain Neuro: No headache, no dizziness, no motor weakness.  Skin: No rash Endocrine:  No polydipsia. No polyuria.  Psych: Denies SI/HI  No problems updated.  ALLERGIES: No Known Allergies  PAST MEDICAL HISTORY: Past Medical History:  Diagnosis Date   Arrhythmia    Bleeding ulcer    Cancer ([place])    Hiatal hernia    Hypertension    Hyperthyroidism    Osteoporosis    Thoracic ascending aortic aneurysm ([place]) 11/06/2017   23mm diagnosed by echo 10/24/17.    MEDICATIONS AT HOME: Prior to Admission medications   Medication Sig Start Date End Date Taking? Authorizing Provider  alendronate (FOSAMAX) 70 MG tablet Take 1 tablet (70 mg total) by mouth once a week. Take with a full glass of water on an empty stomach. 10/08/19  Yes Lucious Groves, DO  amLODipine (NORVASC) 10 MG tablet Take 1 tablet (10 mg total) by mouth daily. 11/06/20  Yes Minette Brine, Amy J, NP  aspirin EC 325 MG tablet Take 325 mg by mouth every morning.    Yes [provider]   Cholecalciferol (VITAMIN D3) 25 MCG (1000 UT) CAPS Vitamin D3 1,000 unit capsule  Take 1 capsule every day by oral route. 03/06/19  Yes Aslam, Loralyn Freshwater, MD  diclofenac Sodium (VOLTAREN) 1 % GEL Apply 2 g topically 4 (four) times daily. 10/20/20  Yes Wieters, Hallie C, PA-C  hydrochlorothiazide (HYDRODIURIL) 50 MG tablet Take 1 tablet (50 mg total) by mouth daily. 11/06/20  Yes Minette Brine, Amy J, NP  pravastatin (PRAVACHOL) 40 MG tablet Take 1 tablet (40 mg total) by mouth every evening. 11/06/20 11/06/21 Yes Nicolette Bang, MD  tiZANidine (ZANAFLEX) 2 MG tablet Take 1 tablet (2 mg total) by mouth 2 (two) times daily as needed for muscle spasms. 03/23/20  Yes Yu, Amy V, PA-C  metoprolol succinate (TOPROL XL) 50 MG 24 hr tablet Take 1 tablet (50 mg total) by mouth daily. Take with or immediately following a meal. 10/08/19 10/07/20  Lucious Groves, DO     Objective:  EXAM:   Vitals:   02/25/21 1050  BP: (!) 143/88  Pulse: 78  Resp: 15  Temp: 97.8 F (36.6 C)  SpO2: 97%  Weight: 110 lb (49.9 kg)  Height: 5' 4.57" (1.64 m)    General appearance : A&OX3. NAD. Non-toxic-appearing HEENT: Atraumatic and Normocephalic.  PERRLA. EOM intact.  TM clear B. Mouth-MMM, post pharynx WNL w/o erythema, No PND. Neck: supple, no JVD. No cervical lymphadenopathy. No thyromegaly Chest/Lungs:  Breathing-non-labored, Good air entry bilaterally, breath  sounds normal without rales, rhonchi, or wheezing  CVS: S1 S2 regular, no murmurs, gallops, rubs  Abdomen: Bowel sounds present, Non tender and not distended with no gaurding, rigidity or rebound. Psych:  TP linear. J/I WNL. Normal speech. Appropriate eye contact and affect.   Data Review Lab Results  Component Value Date   HGBA1C 5.2 09/18/2017     Assessment & Plan   1. Constipation, unspecified constipation type - Drink water, at least 4 glasses of water every day - Stay active. Eat more vegetables - Take stools softeners as indicated as needed -  Report new symptoms to the clinic or local ED. Verbalized understanding.  2. Essential hypertension - Take BP medications as prescribed.     Patient have been counseled extensively about nutrition and exercise  No follow-ups on file.  The patient was given clear instructions to go to ER or return to medical center if symptoms don't improve, worsen or new problems develop. The patient verbalized understanding. The patient was told to call to get lab results if they haven't heard anything in the next week.     Feliberto Gottron, FNP-C [facility] and [facility] [place], [place]   02/25/2021, 11:35 AM

## 2021-02-25 NOTE — Progress Notes (Signed)
Pt presents for stomach and rib pain, originally advised CMA she was having allergy symptoms causing scratchy throat w/hoarseness, reason for visit changed when provider began visit

## 2021-03-18 ENCOUNTER — Ambulatory Visit: Payer: Medicare HMO | Admitting: Family

## 2021-04-06 NOTE — Progress Notes (Signed)
Patient ID: ANAIDA CEJA, female    DOB: March 18, 1934  MRN: [id number]  CC: SHOULDER PAIN   Subjective: Suann Eggleton is a 87 y.o. female who presents for shoulder pain.   Her concerns today include:   SHOULDER PAIN: Right shoulder pain began 2 weeks ago. Has improved since it began. Using over-the-counter arthritis cream and helping. Denies any trauma or injury. Does lift groceries occasionally.   2. HYPERTENSION FOLLOW-UP: 02/25/2021: - Take BP medications as prescribed.  04/09/2021: Reports some days she forgets to take blood pressure medication. She did not take this morning because she was rushing to get to appointment. Denies any red flag symptoms. She does check blood pressures at home.   Patient Active Problem List   Diagnosis Date Noted   Ceruminosis, right 10/04/2019   Back muscle spasm 02/15/2019   Muscle cramp, nocturnal 12/27/2018   Cough 10/12/2018   Hernia of abdominal wall 10/12/2018   Aortic atherosclerosis ([place]) 07/24/2018   Chronic pain of both shoulders 07/24/2018   BMI less than 19,adult 02/01/2018   Difficulty chewing due to dentures 02/01/2018   Thoracic ascending aortic aneurysm ([place]) 11/06/2017   Palpitations 10/05/2017   Seasonal allergies 09/18/2017   Essential hypertension 05/26/2016   Osteoporosis 05/26/2016   Subclinical hyperthyroidism 05/26/2016     Current Outpatient Medications on File Prior to Visit  Medication Sig Dispense Refill   alendronate (FOSAMAX) 70 MG tablet Take 1 tablet (70 mg total) by mouth once a week. Take with a full glass of water on an empty stomach. 12 tablet 3   aspirin EC 325 MG tablet Take 325 mg by mouth every morning.      Cholecalciferol (VITAMIN D3) 25 MCG (1000 UT) CAPS Vitamin D3 1,000 unit capsule  Take 1 capsule every day by oral route. 60 capsule 2   diclofenac Sodium (VOLTAREN) 1 % GEL Apply 2 g topically 4 (four) times daily. 150 g 0   metoprolol succinate (TOPROL XL) 50 MG 24 hr tablet Take 1  tablet (50 mg total) by mouth daily. Take with or immediately following a meal. 90 tablet 3   pravastatin (PRAVACHOL) 40 MG tablet Take 1 tablet (40 mg total) by mouth every evening. 30 tablet 0   tiZANidine (ZANAFLEX) 2 MG tablet Take 1 tablet (2 mg total) by mouth 2 (two) times daily as needed for muscle spasms. 10 tablet 0   No current facility-administered medications on file prior to visit.    No Known Allergies  Social History   Socioeconomic History   Marital status: Married    Spouse name: Not on file   Number of children: 5   Years of education: 9   Highest education level: Not on file  Occupational History   Not on file  Tobacco Use   Smoking status: Never   Smokeless tobacco: Never  Substance and Sexual Activity   Alcohol use: No   Drug use: No   Sexual activity: Not on file  Other Topics Concern   Not on file  Social History Narrative   Denies abuse and feels safe at home.    Social Determinants of Health   Financial Resource Strain: Not on file  Food Insecurity: Not on file  Transportation Needs: Not on file  Physical Activity: Not on file  Stress: Not on file  Social Connections: Not on file  Intimate Partner Violence: Not on file    Family History  Problem Relation Age of Onset   Aneurysm Father 64  Brain   Other Mother 55       Blood Infection   Coronary artery disease Brother 19   Stroke Brother 57   Aneurysm Sister 55       Brain   Cervical cancer Sister 33   Kidney failure Sister 13       was on Dialysis   Aneurysm Sister        Brain    Past Surgical History:  Procedure Laterality Date   ANTERIOR CERVICAL DECOMP/DISCECTOMY FUSION     x3   BREAST BIOPSY Left 02/10/2009   NECK SURGERY     ROTATOR CUFF REPAIR     Left   TUBAL LIGATION     UMBILICAL HERNIA REPAIR      ROS: Review of Systems Negative except as stated above  PHYSICAL EXAM: BP (!) 183/80 (BP Location: Left Arm, Patient Position: Sitting, Cuff Size: Normal)    Pulse 63   Temp 97.9 F (36.6 C)   Resp 16   Ht 5' 4.57" (1.64 m)   Wt 108 lb 12.8 oz (49.4 kg)   SpO2 98%   BMI 18.35 kg/m    Physical Exam HENT:     Head: Normocephalic and atraumatic.  Eyes:     Extraocular Movements: Extraocular movements intact.     Conjunctiva/sclera: Conjunctivae normal.     Pupils: Pupils are equal, round, and reactive to light.  Cardiovascular:     Rate and Rhythm: Normal rate and regular rhythm.     Pulses: Normal pulses.     Heart sounds: Normal heart sounds.  Pulmonary:     Effort: Pulmonary effort is normal.     Breath sounds: Normal breath sounds.  Musculoskeletal:        General: Normal range of motion.     Right shoulder: Normal.     Left shoulder: Normal.     Cervical back: Normal range of motion and neck supple.  Skin:    General: Skin is warm and dry.     Capillary Refill: Capillary refill takes less than 2 seconds.  Neurological:     General: No focal deficit present.     Mental Status: She is alert and oriented to person, place, and time.  Psychiatric:        Mood and Affect: Mood normal.        Behavior: Behavior normal.   ASSESSMENT AND PLAN: 1. Acute pain of right shoulder: - Continue over-the-counter arthritis cream.  - Follow-up with primary provider as scheduled.  2. Essential hypertension: - Blood pressure not at goal during today's visit. Patient asymptomatic without chest pressure, chest pain, palpitations, shortness of breath, and worst headache of life. - Patient reports she did not take blood pressure medications this morning because she was rushing to today's appointment.  - Counseled to take blood pressure medications once returning home.  - Continue Amlodipine and Hydrochlorothiazide as prescribed. - Counseled on blood pressure goal of less than 140/90, low-sodium, DASH diet, medication compliance, 150 minutes of moderate intensity exercise per week as tolerated. Discussed medication compliance, adverse  effects. - Follow-up with primary provider in 1 week or sooner if needed.  - amLODipine (NORVASC) 10 MG tablet; Take 1 tablet (10 mg total) by mouth daily.  Dispense: 90 tablet; Refill: 0 - hydrochlorothiazide (HYDRODIURIL) 50 MG tablet; Take 1 tablet (50 mg total) by mouth daily.  Dispense: 90 tablet; Refill: 0   Patient was given the opportunity to ask questions.  Patient verbalized understanding of the  plan and was able to repeat key elements of the plan. Patient was given clear instructions to go to Emergency Department or return to medical center if symptoms don't improve, worsen, or new problems develop.The patient verbalized understanding.  Requested Prescriptions   Signed Prescriptions Disp Refills   amLODipine (NORVASC) 10 MG tablet 90 tablet 0    Sig: Take 1 tablet (10 mg total) by mouth daily.   hydrochlorothiazide (HYDRODIURIL) 50 MG tablet 90 tablet 0    Sig: Take 1 tablet (50 mg total) by mouth daily.    Return in about 1 week (around 04/16/2021) for Follow-Up or next available hypertension.  Camillia Herter, NP

## 2021-04-08 ENCOUNTER — Other Ambulatory Visit: Payer: Self-pay

## 2021-04-09 ENCOUNTER — Other Ambulatory Visit: Payer: Self-pay

## 2021-04-09 ENCOUNTER — Ambulatory Visit (INDEPENDENT_AMBULATORY_CARE_PROVIDER_SITE_OTHER): Payer: Medicare HMO | Admitting: Family

## 2021-04-09 ENCOUNTER — Encounter: Payer: Self-pay | Admitting: Family

## 2021-04-09 VITALS — BP 183/80 | HR 63 | Temp 97.9°F | Resp 16 | Ht 64.57 in | Wt 108.8 lb

## 2021-04-09 DIAGNOSIS — I10 Essential (primary) hypertension: Secondary | ICD-10-CM | POA: Diagnosis not present

## 2021-04-09 DIAGNOSIS — M25511 Pain in right shoulder: Secondary | ICD-10-CM | POA: Diagnosis not present

## 2021-04-09 MED ORDER — HYDROCHLOROTHIAZIDE 50 MG PO TABS: 50.0000 mg | ORAL_TABLET | Freq: Every day | ORAL | 0 refills | Status: DC

## 2021-04-09 MED ORDER — AMLODIPINE BESYLATE 10 MG PO TABS: 10.0000 mg | ORAL_TABLET | Freq: Every day | ORAL | 0 refills | Status: DC

## 2021-04-09 NOTE — Progress Notes (Signed)
Pt presents for right shoulder pain, pt reports that approx 2 weeks ago pain started used arthritis cream to help alleviate pain

## 2021-04-29 NOTE — Progress Notes (Signed)
Patient ID: Rebecca Estes, female    DOB: 12/04/1933  MRN: [id number]  CC: Hypertension Follow-Up   Subjective: Rebecca Estes is a 87 y.o. female who presents for hypertension follow-up.   Her concerns today include:   HYPERTENSION FOLLOW-UP: 04/09/2021: - Patient reports she did not take blood pressure medications this morning because she was rushing to today's appointment.  - Counseled to take blood pressure medications once returning home.  - Continue Amlodipine and Hydrochlorothiazide as prescribed.  05/03/2021: Doing well on current regimen. No side effects. No issues/concerns.   Patient Active Problem List   Diagnosis Date Noted   Ceruminosis, right 10/04/2019   Back muscle spasm 02/15/2019   Muscle cramp, nocturnal 12/27/2018   Cough 10/12/2018   Hernia of abdominal wall 10/12/2018   Aortic atherosclerosis ([place]) 07/24/2018   Chronic pain of both shoulders 07/24/2018   BMI less than 19,adult 02/01/2018   Difficulty chewing due to dentures 02/01/2018   Thoracic ascending aortic aneurysm ([place]) 11/06/2017   Palpitations 10/05/2017   Seasonal allergies 09/18/2017   Essential hypertension 05/26/2016   Osteoporosis 05/26/2016   Subclinical hyperthyroidism 05/26/2016     Current Outpatient Medications on File Prior to Visit  Medication Sig Dispense Refill   alendronate (FOSAMAX) 70 MG tablet Take 1 tablet (70 mg total) by mouth once a week. Take with a full glass of water on an empty stomach. 12 tablet 3   aspirin EC 325 MG tablet Take 325 mg by mouth every morning.      Cholecalciferol (VITAMIN D3) 25 MCG (1000 UT) CAPS Vitamin D3 1,000 unit capsule  Take 1 capsule every day by oral route. 60 capsule 2   diclofenac Sodium (VOLTAREN) 1 % GEL Apply 2 g topically 4 (four) times daily. 150 g 0   metoprolol succinate (TOPROL XL) 50 MG 24 hr tablet Take 1 tablet (50 mg total) by mouth daily. Take with or immediately following a meal. 90 tablet 3   pravastatin  (PRAVACHOL) 40 MG tablet Take 1 tablet (40 mg total) by mouth every evening. 30 tablet 0   tiZANidine (ZANAFLEX) 2 MG tablet Take 1 tablet (2 mg total) by mouth 2 (two) times daily as needed for muscle spasms. 10 tablet 0   No current facility-administered medications on file prior to visit.    No Known Allergies  Social History   Socioeconomic History   Marital status: Married    Spouse name: Not on file   Number of children: 5   Years of education: 9   Highest education level: Not on file  Occupational History   Not on file  Tobacco Use   Smoking status: Never   Smokeless tobacco: Never  Substance and Sexual Activity   Alcohol use: No   Drug use: No   Sexual activity: Not on file  Other Topics Concern   Not on file  Social History Narrative   Denies abuse and feels safe at home.    Social Determinants of Health   Financial Resource Strain: Not on file  Food Insecurity: Not on file  Transportation Needs: Not on file  Physical Activity: Not on file  Stress: Not on file  Social Connections: Not on file  Intimate Partner Violence: Not on file    Family History  Problem Relation Age of Onset   Aneurysm Father 45       Brain   Other Mother 59       Blood Infection   Coronary artery disease Brother  32   Stroke Brother 62   Aneurysm Sister 73       Brain   Cervical cancer Sister 64   Kidney failure Sister 73       was on Dialysis   Aneurysm Sister        Brain    Past Surgical History:  Procedure Laterality Date   ANTERIOR CERVICAL DECOMP/DISCECTOMY FUSION     x3   BREAST BIOPSY Left 02/10/2009   NECK SURGERY     ROTATOR CUFF REPAIR     Left   TUBAL LIGATION     UMBILICAL HERNIA REPAIR      ROS: Review of Systems Negative except as stated above  PHYSICAL EXAM: BP (!) 148/91 (BP Location: Left Arm, Patient Position: Sitting, Cuff Size: Normal)   Pulse (!) 59   Temp 97.7 F (36.5 C)   Resp 16   Ht 5' 4.57" (1.64 m)   Wt 106 lb 12.8 oz (48.4 kg)    SpO2 97%   BMI 18.01 kg/m   Physical Exam HENT:     Head: Normocephalic and atraumatic.  Eyes:     Extraocular Movements: Extraocular movements intact.     Conjunctiva/sclera: Conjunctivae normal.     Pupils: Pupils are equal, round, and reactive to light.  Cardiovascular:     Rate and Rhythm: Bradycardia present.     Pulses: Normal pulses.     Heart sounds: Normal heart sounds.  Pulmonary:     Effort: Pulmonary effort is normal.     Breath sounds: Normal breath sounds.  Musculoskeletal:     Cervical back: Normal range of motion and neck supple.  Neurological:     General: No focal deficit present.     Mental Status: She is alert and oriented to person, place, and time.  Psychiatric:        Mood and Affect: Mood normal.        Behavior: Behavior normal.   ASSESSMENT AND PLAN: 1. Essential hypertension: - Blood pressure close to goal.  - Continue Amlodipine and Hydrochlorothiazide as prescribed.  - Counseled on blood pressure goal of less than 140/90, low-sodium, DASH diet, medication compliance, 150 minutes of moderate intensity exercise per week as tolerated. Discussed medication compliance, adverse effects. - Follow-up with primary provider in 3 months or sooner if needed.  - Basic Metabolic Panel - amLODipine (NORVASC) 10 MG tablet; Take 1 tablet (10 mg total) by mouth daily.  Dispense: 90 tablet; Refill: 0 - hydrochlorothiazide (HYDRODIURIL) 50 MG tablet; Take 1 tablet (50 mg total) by mouth daily.  Dispense: 90 tablet; Refill: 0   Patient was given the opportunity to ask questions.  Patient verbalized understanding of the plan and was able to repeat key elements of the plan. Patient was given clear instructions to go to Emergency Department or return to medical center if symptoms don't improve, worsen, or new problems develop.The patient verbalized understanding.   Orders Placed This Encounter  Procedures   Basic Metabolic Panel     Requested Prescriptions    Signed Prescriptions Disp Refills   amLODipine (NORVASC) 10 MG tablet 90 tablet 0    Sig: Take 1 tablet (10 mg total) by mouth daily.   hydrochlorothiazide (HYDRODIURIL) 50 MG tablet 90 tablet 0    Sig: Take 1 tablet (50 mg total) by mouth daily.    Return in about 3 months (around 08/02/2021) for Follow-Up or next available hypertension .  Camillia Herter, NP

## 2021-04-30 ENCOUNTER — Telehealth: Payer: Self-pay | Admitting: Internal Medicine

## 2021-04-30 NOTE — Telephone Encounter (Signed)
Left message for patient to call back and schedule Medicare Annual Wellness Visit (AWV) in office.   If not able to come in office, please offer to do virtually or by telephone.   Due for AWVI  Please schedule at anytime with Nurse Health Advisor.   

## 2021-05-03 ENCOUNTER — Ambulatory Visit (INDEPENDENT_AMBULATORY_CARE_PROVIDER_SITE_OTHER): Payer: Medicare HMO | Admitting: Family

## 2021-05-03 ENCOUNTER — Other Ambulatory Visit: Payer: Self-pay

## 2021-05-03 VITALS — BP 148/91 | HR 59 | Temp 97.7°F | Resp 16 | Ht 64.57 in | Wt 106.8 lb

## 2021-05-03 DIAGNOSIS — I10 Essential (primary) hypertension: Secondary | ICD-10-CM

## 2021-05-03 MED ORDER — AMLODIPINE BESYLATE 10 MG PO TABS: 10.0000 mg | ORAL_TABLET | Freq: Every day | ORAL | 0 refills | Status: DC

## 2021-05-03 MED ORDER — HYDROCHLOROTHIAZIDE 50 MG PO TABS: 50.0000 mg | ORAL_TABLET | Freq: Every day | ORAL | 0 refills | Status: DC

## 2021-05-03 NOTE — Progress Notes (Signed)
Pt presents for hypertension follow-up  ?

## 2021-06-15 ENCOUNTER — Other Ambulatory Visit: Payer: Self-pay

## 2021-06-15 ENCOUNTER — Ambulatory Visit
Admission: EM | Admit: 2021-06-15 | Discharge: 2021-06-15 | Disposition: A | Discharge status: Home / Self Care | Payer: Medicare HMO | Attending: Physician Assistant | Admitting: Physician Assistant

## 2021-06-15 DIAGNOSIS — M25462 Effusion, left knee: Secondary | ICD-10-CM

## 2021-06-15 MED ORDER — PREDNISONE 20 MG PO TABS: 40.0000 mg | ORAL_TABLET | Freq: Every day | ORAL | 0 refills | Status: AC

## 2021-06-15 NOTE — ED Provider Notes (Signed)
[facility]    CSN: [id number] Arrival date & time: 06/15/21  1327      History   Chief Complaint Chief Complaint  Patient presents with   left knee edema    HPI Rebecca Estes is a 87 y.o. female.   Patient here today for evaluation of edema to her left knee that started about 3 weeks ago.  She reports that symptoms started after she was on a long trip with her husband.  She denies any pain to her knee or lower leg.  She reports that she has been using an Ace bandage with some improvement of swelling.  She denies any other injury.  The history is provided by the patient.   Past Medical History:  Diagnosis Date   Arrhythmia    Bleeding ulcer    Cancer ([place])    Hiatal hernia    Hypertension    Hyperthyroidism    Osteoporosis    Thoracic ascending aortic aneurysm 11/06/2017   78mm diagnosed by echo 10/24/17.    Patient Active Problem List   Diagnosis Date Noted   Ceruminosis, right 10/04/2019   Back muscle spasm 02/15/2019   Muscle cramp, nocturnal 12/27/2018   Cough 10/12/2018   Hernia of abdominal wall 10/12/2018   Aortic atherosclerosis ([place]) 07/24/2018   Chronic pain of both shoulders 07/24/2018   BMI less than 19,adult 02/01/2018   Difficulty chewing due to dentures 02/01/2018   Thoracic ascending aortic aneurysm 11/06/2017   Palpitations 10/05/2017   Seasonal allergies 09/18/2017   Essential hypertension 05/26/2016   Osteoporosis 05/26/2016   Subclinical hyperthyroidism 05/26/2016    Past Surgical History:  Procedure Laterality Date   ANTERIOR CERVICAL DECOMP/DISCECTOMY FUSION     x3   BREAST BIOPSY Left 02/10/2009   NECK SURGERY     ROTATOR CUFF REPAIR     Left   TUBAL LIGATION     UMBILICAL HERNIA REPAIR      OB History   No obstetric history on file.      Home Medications    Prior to Admission medications   Medication Sig Start Date End Date Taking? Authorizing Provider  predniSONE (DELTASONE) 20 MG tablet Take 2  tablets (40 mg total) by mouth daily with breakfast for 5 days. 06/15/21 06/20/21 Yes Francene Finders, PA-C  alendronate (FOSAMAX) 70 MG tablet Take 1 tablet (70 mg total) by mouth once a week. Take with a full glass of water on an empty stomach. 10/08/19   Lucious Groves, DO  amLODipine (NORVASC) 10 MG tablet Take 1 tablet (10 mg total) by mouth daily. 05/03/21   Camillia Herter, NP  aspirin EC 325 MG tablet Take 325 mg by mouth every morning.     [provider]  Cholecalciferol (VITAMIN D3) 25 MCG (1000 UT) CAPS Vitamin D3 1,000 unit capsule  Take 1 capsule every day by oral route. 03/06/19   Harvie Heck, MD  diclofenac Sodium (VOLTAREN) 1 % GEL Apply 2 g topically 4 (four) times daily. 10/20/20   Wieters, Hallie C, PA-C  hydrochlorothiazide (HYDRODIURIL) 50 MG tablet Take 1 tablet (50 mg total) by mouth daily. 05/03/21   Camillia Herter, NP  metoprolol succinate (TOPROL XL) 50 MG 24 hr tablet Take 1 tablet (50 mg total) by mouth daily. Take with or immediately following a meal. 10/08/19 10/07/20  Lucious Groves, DO  pravastatin (PRAVACHOL) 40 MG tablet Take 1 tablet (40 mg total) by mouth every evening. 11/06/20 11/06/21  Juleen China,  Bayard Beaver, MD  tiZANidine (ZANAFLEX) 2 MG tablet Take 1 tablet (2 mg total) by mouth 2 (two) times daily as needed for muscle spasms. 03/23/20   Ok Edwards, PA-C    Family History Family History  Problem Relation Age of Onset   Aneurysm Father 58       Brain   Other Mother 3       Blood Infection   Coronary artery disease Brother 73   Stroke Brother 36   Aneurysm Sister 45       Brain   Cervical cancer Sister 36   Kidney failure Sister 3       was on Dialysis   Aneurysm Sister        Brain    Social History Social History   Tobacco Use   Smoking status: Never   Smokeless tobacco: Never  Substance Use Topics   Alcohol use: No   Drug use: No     Allergies   Patient has no known allergies.   Review of Systems Review of Systems   Constitutional:  Negative for chills and fever.  Eyes:  Negative for discharge and redness.  Respiratory:  Negative for shortness of breath.   Gastrointestinal:  Negative for abdominal pain, nausea and vomiting.  Genitourinary:  Positive for vaginal bleeding and vaginal discharge.  Musculoskeletal:  Positive for arthralgias and joint swelling.  Skin:  Negative for color change.    Physical Exam Triage Vital Signs ED Triage Vitals  Enc Vitals Group     BP 06/15/21 1503 (!) 162/80     Pulse Rate 06/15/21 1503 62     Resp 06/15/21 1503 18     Temp 06/15/21 1503 97.8 F (36.6 C)     Temp Source 06/15/21 1503 Oral     SpO2 06/15/21 1503 98 %     Weight --      Height --      Head Circumference --      Peak Flow --      Pain Score 06/15/21 1504 0     Pain Loc --      Pain Edu? --      Excl. in [place]? --    No data found.  Updated Vital Signs BP (!) 162/80 (BP Location: Left Arm)   Pulse 62   Temp 97.8 F (36.6 C) (Oral)   Resp 18   SpO2 98%      Physical Exam Vitals and nursing note reviewed.  Constitutional:      General: She is not in acute distress.    Appearance: Normal appearance. She is not ill-appearing.  HENT:     Head: Normocephalic and atraumatic.  Eyes:     Conjunctiva/sclera: Conjunctivae normal.  Cardiovascular:     Rate and Rhythm: Normal rate.  Pulmonary:     Effort: Pulmonary effort is normal.  Musculoskeletal:     Comments: Mild edema noted to left medial knee, no erythema or warmth, no TTP to knee or to lower leg, no swelling or erythema to left lower leg  Neurological:     Mental Status: She is alert.  Psychiatric:        Mood and Affect: Mood normal.        Behavior: Behavior normal.        Thought Content: Thought content normal.     UC Treatments / Results  Labs (all labs ordered are listed, but only abnormal results are displayed) Labs Reviewed - No data to  display  EKG   Radiology No results found.  Procedures Procedures  (including critical care time)  Medications Ordered in UC Medications - No data to display  Initial Impression / Assessment and Plan / UC Course  I have reviewed the triage vital signs and the nursing notes.  Pertinent labs & imaging results that were available during my care of the patient were reviewed by me and considered in my medical decision making (see chart for details).  Unknown etiology of edema, but given lack of pain recommended continued compression and will prescribe steroid burst to hopefully help with any inflammation.  Encouraged follow-up if symptoms fail to improve or worsen anyway.  Final Clinical Impressions(s) / UC Diagnoses   Final diagnoses:  Swelling of joint of left knee     Discharge Instructions      Keep ace bandage on knee during the day,  follow up if no improvement or if symptoms worsen.    ED Prescriptions     Medication Sig Dispense Auth. Provider   predniSONE (DELTASONE) 20 MG tablet Take 2 tablets (40 mg total) by mouth daily with breakfast for 5 days. 10 tablet Francene Finders, PA-C      PDMP not reviewed this encounter.   Francene Finders, PA-C 06/15/21 1622

## 2021-06-15 NOTE — Discharge Instructions (Addendum)
Keep ace bandage on knee during the day,  follow up if no improvement or if symptoms worsen.

## 2021-06-15 NOTE — ED Triage Notes (Signed)
Pt c/o left knee edema.  Onset about a month ago.

## 2021-07-27 NOTE — Progress Notes (Signed)
Patient ID: Rebecca Estes, female    DOB: 1934-06-02  MRN: [id number]  CC: Hypertension Follow-Up  Subjective: Rebecca Estes is a 87 y.o. female who presents for hypertension follow-up.   Her concerns today include:   HYPERTENSION FOLLOW-UP: 05/03/2021: - Continue Amlodipine and Hydrochlorothiazide as prescribed.   08/04/2021: Taking medications as prescribed. Reports she does eat salty foods. Denies chest pain and shortness of breath.   Patient Active Problem List   Diagnosis Date Noted   Ceruminosis, right 10/04/2019   Back muscle spasm 02/15/2019   Muscle cramp, nocturnal 12/27/2018   Cough 10/12/2018   Hernia of abdominal wall 10/12/2018   Aortic atherosclerosis ([place]) 07/24/2018   Chronic pain of both shoulders 07/24/2018   BMI less than 19,adult 02/01/2018   Difficulty chewing due to dentures 02/01/2018   Thoracic ascending aortic aneurysm 11/06/2017   Palpitations 10/05/2017   Seasonal allergies 09/18/2017   Essential hypertension 05/26/2016   Osteoporosis 05/26/2016   Subclinical hyperthyroidism 05/26/2016     Current Outpatient Medications on File Prior to Visit  Medication Sig Dispense Refill   alendronate (FOSAMAX) 70 MG tablet Take 1 tablet (70 mg total) by mouth once a week. Take with a full glass of water on an empty stomach. 12 tablet 3   aspirin EC 325 MG tablet Take 325 mg by mouth every morning.      Cholecalciferol (VITAMIN D3) 25 MCG (1000 UT) CAPS Vitamin D3 1,000 unit capsule  Take 1 capsule every day by oral route. 60 capsule 2   diclofenac Sodium (VOLTAREN) 1 % GEL Apply 2 g topically 4 (four) times daily. 150 g 0   metoprolol succinate (TOPROL XL) 50 MG 24 hr tablet Take 1 tablet (50 mg total) by mouth daily. Take with or immediately following a meal. 90 tablet 3   pravastatin (PRAVACHOL) 40 MG tablet Take 1 tablet (40 mg total) by mouth every evening. 30 tablet 0   tiZANidine (ZANAFLEX) 2 MG tablet Take 1 tablet (2 mg total) by mouth  2 (two) times daily as needed for muscle spasms. 10 tablet 0   No current facility-administered medications on file prior to visit.    No Known Allergies  Social History   Socioeconomic History   Marital status: Married    Spouse name: Not on file   Number of children: 5   Years of education: 9   Highest education level: Not on file  Occupational History   Not on file  Tobacco Use   Smoking status: Never   Smokeless tobacco: Never  Substance and Sexual Activity   Alcohol use: No   Drug use: No   Sexual activity: Not on file  Other Topics Concern   Not on file  Social History Narrative   Denies abuse and feels safe at home.    Social Determinants of Health   Financial Resource Strain: Not on file  Food Insecurity: Not on file  Transportation Needs: Not on file  Physical Activity: Not on file  Stress: Not on file  Social Connections: Not on file  Intimate Partner Violence: Not on file    Family History  Problem Relation Age of Onset   Aneurysm Father 37       Brain   Other Mother 49       Blood Infection   Coronary artery disease Brother 55   Stroke Brother 43   Aneurysm Sister 110       Brain   Cervical cancer Sister 87  Kidney failure Sister 70       was on Dialysis   Aneurysm Sister        Brain    Past Surgical History:  Procedure Laterality Date   ANTERIOR CERVICAL DECOMP/DISCECTOMY FUSION     x3   BREAST BIOPSY Left 02/10/2009   NECK SURGERY     ROTATOR CUFF REPAIR     Left   TUBAL LIGATION     UMBILICAL HERNIA REPAIR      ROS: Review of Systems Negative except as stated above  PHYSICAL EXAM: BP (!) 173/85 (BP Location: Left Arm, Patient Position: Sitting, Cuff Size: Small)    Pulse 66    Temp 98.3 F (36.8 C)    Resp 18    Ht 5' 4.57" (1.64 m)    Wt 107 lb 12.8 oz (48.9 kg)    SpO2 98%    BMI 18.18 kg/m    Physical Exam HENT:     Head: Normocephalic and atraumatic.  Eyes:     Extraocular Movements: Extraocular movements intact.      Conjunctiva/sclera: Conjunctivae normal.     Pupils: Pupils are equal, round, and reactive to light.  Cardiovascular:     Rate and Rhythm: Normal rate and regular rhythm.     Pulses: Normal pulses.     Heart sounds: Normal heart sounds.  Pulmonary:     Effort: Pulmonary effort is normal.     Breath sounds: Normal breath sounds.  Musculoskeletal:     Cervical back: Normal range of motion and neck supple.  Neurological:     General: No focal deficit present.     Mental Status: She is alert and oriented to person, place, and time.  Psychiatric:        Mood and Affect: Mood normal.        Behavior: Behavior normal.    ASSESSMENT AND PLAN: 1. Systolic hypertension: - Continue Amlodipine and Hydrochlorothiazide as prescribed.  - Update BMP. - Counseled on blood pressure goal of less than 140/90, low-sodium, DASH diet, medication compliance, 150 minutes of moderate intensity exercise per week as tolerated. Discussed medication compliance, adverse effects. - [facility] for further evaluation and management.  - Follow-up with primary provider as scheduled.  - Basic metabolic panel - Ambulatory referral to [facility] - [facility] [place] - amLODipine (NORVASC) 10 MG tablet; Take 1 tablet (10 mg total) by mouth daily.  Dispense: 90 tablet; Refill: 0 - hydrochlorothiazide (HYDRODIURIL) 50 MG tablet; Take 1 tablet (50 mg total) by mouth daily.  Dispense: 90 tablet; Refill: 0   Patient was given the opportunity to ask questions.  Patient verbalized understanding of the plan and was able to repeat key elements of the plan. Patient was given clear instructions to go to Emergency Department or return to medical center if symptoms don't improve, worsen, or new problems develop.The patient verbalized understanding.   Orders Placed This Encounter  Procedures   Basic metabolic panel   Ambulatory referral to [facility]  [place]     Requested Prescriptions   Signed Prescriptions Disp Refills   amLODipine (NORVASC) 10 MG tablet 90 tablet 0    Sig: Take 1 tablet (10 mg total) by mouth daily.   hydrochlorothiazide (HYDRODIURIL) 50 MG tablet 90 tablet 0    Sig: Take 1 tablet (50 mg total) by mouth daily.    Referral to [facility] and follow-up with primary provider as scheduled.  Reyn Faivre  Zachery Dauer, NP

## 2021-08-04 ENCOUNTER — Other Ambulatory Visit: Payer: Self-pay

## 2021-08-04 ENCOUNTER — Encounter: Payer: Self-pay | Admitting: Family

## 2021-08-04 ENCOUNTER — Ambulatory Visit (INDEPENDENT_AMBULATORY_CARE_PROVIDER_SITE_OTHER): Payer: Medicare HMO | Admitting: Family

## 2021-08-04 VITALS — BP 173/85 | HR 66 | Temp 98.3°F | Resp 18 | Ht 64.57 in | Wt 107.8 lb

## 2021-08-04 DIAGNOSIS — I10 Essential (primary) hypertension: Secondary | ICD-10-CM

## 2021-08-04 MED ORDER — HYDROCHLOROTHIAZIDE 50 MG PO TABS: 50.0000 mg | ORAL_TABLET | Freq: Every day | ORAL | 0 refills | Status: DC

## 2021-08-04 MED ORDER — AMLODIPINE BESYLATE 10 MG PO TABS: 10.0000 mg | ORAL_TABLET | Freq: Every day | ORAL | 0 refills | Status: DC

## 2021-08-04 NOTE — Progress Notes (Signed)
Pt presents for hypertension follow-up  ?

## 2021-08-05 LAB — BASIC METABOLIC PANEL
BUN/Creatinine Ratio: 28 (ref 12–28)
BUN: 22 mg/dL (ref 8–27)
CO2: 25 mmol/L (ref 20–29)
Calcium: 10.1 mg/dL (ref 8.7–10.3)
Chloride: 104 mmol/L (ref 96–106)
Creatinine, Ser: 0.8 mg/dL (ref 0.57–1.00)
Glucose: 84 mg/dL (ref 70–99)
Potassium: 4.1 mmol/L (ref 3.5–5.2)
Sodium: 142 mmol/L (ref 134–144)
eGFR: 72 mL/min/{1.73_m2} (ref >59)

## 2021-08-05 NOTE — Progress Notes (Signed)
Please call patient with update.   Kidney function normal.

## 2021-08-25 ENCOUNTER — Encounter: Payer: Self-pay | Admitting: Orthopaedic Surgery

## 2021-08-25 ENCOUNTER — Other Ambulatory Visit: Payer: Self-pay

## 2021-08-25 ENCOUNTER — Ambulatory Visit (INDEPENDENT_AMBULATORY_CARE_PROVIDER_SITE_OTHER): Payer: Medicare HMO | Admitting: Orthopaedic Surgery

## 2021-08-25 ENCOUNTER — Ambulatory Visit (INDEPENDENT_AMBULATORY_CARE_PROVIDER_SITE_OTHER): Payer: Medicare HMO

## 2021-08-25 VITALS — Wt 110.0 lb

## 2021-08-25 DIAGNOSIS — G8929 Other chronic pain: Secondary | ICD-10-CM

## 2021-08-25 DIAGNOSIS — M25562 Pain in left knee: Secondary | ICD-10-CM

## 2021-08-25 MED ORDER — METHYLPREDNISOLONE ACETATE 40 MG/ML IJ SUSP
80.0000 mg | INTRAMUSCULAR | Status: AC | PRN
  Administered 2021-08-25: 80 mg via INTRA_ARTICULAR

## 2021-08-25 MED ORDER — LIDOCAINE HCL 1 % IJ SOLN
4.0000 mL | INTRAMUSCULAR | Status: AC | PRN
  Administered 2021-08-25: 4 mL

## 2021-08-25 NOTE — Progress Notes (Signed)
Office Visit Note   Patient: Rebecca Estes           Date of Birth: [birth year]           MRN: [id number] Visit Date: 08/25/2021              Requested by: Camillia Herter, NP [address],  [place] [id number] PCP: Camillia Herter, NP   Assessment & Plan: Visit Diagnoses: Left knee pain Plan: Patient is a pleasant 88 year old woman who presented today with left knee pain.  She did not have any injuries.  She says sometimes it swollen not as much today.  It seems to be episodic and sometimes it goes away or get much better.  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  I talked about the natural history of this with her and her husband.  Recommended injection of steroid today and she will go through with this.  Follow-Up Instructions: No follow-ups on file.   Orders:  No orders of the defined types were placed in this encounter.  No orders of the defined types were placed in this encounter.     Procedures: Large Joint Inj: L knee on 08/25/2021 2:57 PM Indications: pain and diagnostic evaluation Details: 25 G 1.5 in needle  Arthrogram: No  Medications: 80 mg methylPREDNISolone acetate 40 MG/ML; 4 mL lidocaine 1 % Outcome: tolerated well, no immediate complications Procedure, treatment alternatives, risks and benefits explained, specific risks discussed. Consent was given by the patient.      Clinical Data: No additional findings.   Subjective: No chief complaint on file. Patient presents today for left knee pain. She said that it swells on and off for quite some time now. In the last couple days it has worsened. She has pain anteriorly with movement. She had no injury. She takes an aspirin for pain relief.     Review of Systems  All other systems reviewed and are negative.   Objective: Vital Signs: There were no vitals taken for this visit.  Physical Exam  Ortho Exam Examination of her left knee she has no effusion today.  No  erythema or warmth.  She is focally tender over the medial greater than lateral joint line.  She does have patellar grind with range of motion. Specialty Comments:  No specialty comments available.  Imaging: No results found.   PMFS History: Patient Active Problem List   Diagnosis Date Noted   Ceruminosis, right 10/04/2019   Back muscle spasm 02/15/2019   Muscle cramp, nocturnal 12/27/2018   Cough 10/12/2018   Hernia of abdominal wall 10/12/2018   Aortic atherosclerosis ([place]) 07/24/2018   Chronic pain of both shoulders 07/24/2018   BMI less than 19,adult 02/01/2018   Difficulty chewing due to dentures 02/01/2018   Thoracic ascending aortic aneurysm 11/06/2017   Palpitations 10/05/2017   Seasonal allergies 09/18/2017   Essential hypertension 05/26/2016   Osteoporosis 05/26/2016   Subclinical hyperthyroidism 05/26/2016   Past Medical History:  Diagnosis Date   Arrhythmia    Bleeding ulcer    Cancer ([place])    Hiatal hernia    Hypertension    Hyperthyroidism    Osteoporosis    Thoracic ascending aortic aneurysm 11/06/2017   29mm diagnosed by echo 10/24/17.    Family History  Problem Relation Age of Onset   Aneurysm Father 7       Brain   Other Mother 19       Blood Infection  Coronary artery disease Brother 52   Stroke Brother 62   Aneurysm Sister 76       Brain   Cervical cancer Sister 53   Kidney failure Sister 65       was on Dialysis   Aneurysm Sister        Brain    Past Surgical History:  Procedure Laterality Date   ANTERIOR CERVICAL DECOMP/DISCECTOMY FUSION     x3   BREAST BIOPSY Left 02/10/2009   NECK SURGERY     ROTATOR CUFF REPAIR     Left   TUBAL LIGATION     UMBILICAL HERNIA REPAIR     Social History   Occupational History   Not on file  Tobacco Use   Smoking status: Never   Smokeless tobacco: Never  Substance and Sexual Activity   Alcohol use: No   Drug use: No   Sexual activity: Not on file

## 2021-09-27 ENCOUNTER — Encounter: Payer: Self-pay | Admitting: Cardiovascular Disease

## 2021-11-02 ENCOUNTER — Ambulatory Visit
Admission: EM | Admit: 2021-11-02 | Discharge: 2021-11-02 | Disposition: A | Discharge status: Other Acute Inpatient Hospital | Payer: Medicare HMO

## 2021-11-02 ENCOUNTER — Encounter (HOSPITAL_COMMUNITY): Payer: Self-pay | Admitting: Pharmacy Technician

## 2021-11-02 ENCOUNTER — Emergency Department (HOSPITAL_COMMUNITY): Payer: Medicare HMO

## 2021-11-02 ENCOUNTER — Other Ambulatory Visit: Payer: Self-pay

## 2021-11-02 ENCOUNTER — Emergency Department (HOSPITAL_COMMUNITY)
Admission: EM | Admit: 2021-11-02 | Discharge: 2021-11-02 | Disposition: A | Discharge status: Home / Self Care | Payer: Medicare HMO | Attending: Emergency Medicine | Admitting: Emergency Medicine

## 2021-11-02 DIAGNOSIS — S0990XA Unspecified injury of head, initial encounter: Secondary | ICD-10-CM | POA: Diagnosis not present

## 2021-11-02 DIAGNOSIS — Z7982 Long term (current) use of aspirin: Secondary | ICD-10-CM | POA: Insufficient documentation

## 2021-11-02 DIAGNOSIS — Z043 Encounter for examination and observation following other accident: Secondary | ICD-10-CM | POA: Diagnosis not present

## 2021-11-02 DIAGNOSIS — M47812 Spondylosis without myelopathy or radiculopathy, cervical region: Secondary | ICD-10-CM | POA: Diagnosis not present

## 2021-11-02 DIAGNOSIS — Z79899 Other long term (current) drug therapy: Secondary | ICD-10-CM | POA: Diagnosis not present

## 2021-11-02 DIAGNOSIS — M2578 Osteophyte, vertebrae: Secondary | ICD-10-CM | POA: Diagnosis not present

## 2021-11-02 DIAGNOSIS — M50322 Other cervical disc degeneration at C5-C6 level: Secondary | ICD-10-CM | POA: Diagnosis not present

## 2021-11-02 DIAGNOSIS — S0101XA Laceration without foreign body of scalp, initial encounter: Secondary | ICD-10-CM | POA: Insufficient documentation

## 2021-11-02 DIAGNOSIS — W108XXA Fall (on) (from) other stairs and steps, initial encounter: Secondary | ICD-10-CM | POA: Diagnosis not present

## 2021-11-02 DIAGNOSIS — Z23 Encounter for immunization: Secondary | ICD-10-CM | POA: Insufficient documentation

## 2021-11-02 DIAGNOSIS — M50321 Other cervical disc degeneration at C4-C5 level: Secondary | ICD-10-CM | POA: Diagnosis not present

## 2021-11-02 DIAGNOSIS — G319 Degenerative disease of nervous system, unspecified: Secondary | ICD-10-CM | POA: Diagnosis not present

## 2021-11-02 MED ORDER — TETANUS-DIPHTH-ACELL PERTUSSIS 5-2.5-18.5 LF-MCG/0.5 IM SUSY
0.5000 mL | PREFILLED_SYRINGE | Freq: Once | INTRAMUSCULAR | Status: AC
  Administered 2021-11-02: 0.5 mL via INTRAMUSCULAR
  Filled 2021-11-02: qty 0.5

## 2021-11-02 MED ORDER — LIDOCAINE-EPINEPHRINE 2 %-1:200000 IJ SOLN
20.0000 mL | Freq: Once | INTRAMUSCULAR | Status: AC
Start: 2021-11-02 — End: 2021-11-02
  Administered 2021-11-02: 20 mL
  Filled 2021-11-02: qty 20

## 2021-11-02 NOTE — ED Provider Notes (Signed)
?[place] ?Provider Note ? ? ?CSN: [id number] ?Arrival date & time: 11/02/21  1608 ? ?  ? ?History ? ?Chief Complaint  ?Patient presents with  ? Fall  ? ? ?Rebecca Estes is a 88 y.o. female 88 year old female with no significant past medical history presents after mechanical fall, laceration of the right forehead.  Patient reports she was carrying groceries up the stairs when she lost her balance and struck her head.  She denies loss of conscious.  She Dors is some transient nausea but denies significant head pain, any nausea or vomiting, photosensitivity, confusion, or other complaints at this time.  She denies any injury of arms, legs, difficulty walking since the accident.  She does not take any blood thinners at this time. ? ? ?Fall ? ? ?  ? ?Home Medications ?Prior to Admission medications   ?Medication Sig Start Date End Date Taking? Authorizing Provider  ?alendronate (FOSAMAX) 70 MG tablet Take 1 tablet (70 mg total) by mouth once a week. Take with a full glass of water on an empty stomach. 10/08/19   Lucious Groves, DO  ?amLODipine (NORVASC) 10 MG tablet Take 1 tablet (10 mg total) by mouth daily. 08/04/21   Camillia Herter, NP  ?aspirin EC 325 MG tablet Take 325 mg by mouth every morning.     [provider]  ?Cholecalciferol (VITAMIN D3) 25 MCG (1000 UT) CAPS Vitamin D3 1,000 unit capsule  Take 1 capsule every day by oral route. 03/06/19   Harvie Heck, MD  ?diclofenac Sodium (VOLTAREN) 1 % GEL Apply 2 g topically 4 (four) times daily. 10/20/20   Wieters, Hallie C, PA-C  ?hydrochlorothiazide (HYDRODIURIL) 50 MG tablet Take 1 tablet (50 mg total) by mouth daily. 08/04/21   Camillia Herter, NP  ?metoprolol succinate (TOPROL XL) 50 MG 24 hr tablet Take 1 tablet (50 mg total) by mouth daily. Take with or immediately following a meal. 10/08/19 10/07/20  Lucious Groves, DO  ?pravastatin (PRAVACHOL) 40 MG tablet Take 1 tablet (40 mg total) by mouth every evening.  11/06/20 11/06/21  Nicolette Bang, MD  ?tiZANidine (ZANAFLEX) 2 MG tablet Take 1 tablet (2 mg total) by mouth 2 (two) times daily as needed for muscle spasms. 03/23/20   Ok Edwards, PA-C  ?   ? ?Allergies    ?Patient has no known allergies.   ? ?Review of Systems   ?Review of Systems  ?Skin:  Positive for wound.  ?All other systems reviewed and are negative. ? ?Physical Exam ?Updated Vital Signs ?BP (!) 156/94 (BP Location: Left Arm)   Pulse 74   Temp 98.6 ?F (37 ?C) (Oral)   Resp 16   SpO2 97%  ?Physical Exam ?Vitals and nursing note reviewed.  ?Constitutional:   ?   General: She is not in acute distress. ?   Appearance: Normal appearance.  ?HENT:  ?   Head: Normocephalic and atraumatic.  ?Eyes:  ?   General:     ?   Right eye: No discharge.     ?   Left eye: No discharge.  ?Cardiovascular:  ?   Rate and Rhythm: Normal rate and regular rhythm.  ?Pulmonary:  ?   Effort: Pulmonary effort is normal. No respiratory distress.  ?Musculoskeletal:     ?   General: No deformity.  ?   Comments: No tenderness to palpation of midline cervical spine, upper or lower extremities.  Patient is able to ambulate without  difficulty.  No tenderness of the chest wall, hips.  ?Skin: ?   General: Skin is warm and dry.  ?   Comments: Patient with large open, not actively bleeding laceration of the right forehead into the right frontal scalp.  Lacerations approximately 8 cm, overall linear with slight angulation.  Please see picture for further information.  ?Neurological:  ?   Mental Status: She is alert and oriented to person, place, and time.  ?   Comments: Cranial nerves II through XII grossly intact.  Intact finger-nose, intact heel-to-shin.  Romberg negative, gait normal.  Alert and oriented x3.  Moves all 4 limbs spontaneously, normal coordination.  No pronator drift.  Intact strength 5 out of 5 bilateral upper and lower extremities. ? ?  ?Psychiatric:     ?   Mood and Affect: Mood normal.     ?   Behavior: Behavior normal.   ? ? ? ?ED Results / Procedures / Treatments   ?Labs ?(all labs ordered are listed, but only abnormal results are displayed) ?Labs Reviewed - No data to display ? ?EKG ?None ? ?Radiology ?CT Head Wo Contrast ? ?Result Date: 11/02/2021 ?CLINICAL DATA:  Status post fall. EXAM: CT HEAD WITHOUT CONTRAST TECHNIQUE: Contiguous axial images were obtained from the base of the skull through the vertex without intravenous contrast. RADIATION DOSE REDUCTION: This exam was performed according to the departmental dose-optimization program which includes automated exposure control, adjustment of the mA and/or kV according to patient size and/or use of iterative reconstruction technique. COMPARISON:  March 05, 2018 FINDINGS: Brain: There is mild cerebral atrophy with widening of the extra-axial spaces and ventricular dilatation. There are areas of decreased attenuation within the white matter tracts of the supratentorial brain, consistent with microvascular disease changes. Vascular: No hyperdense vessel or unexpected calcification. Skull: Normal. Negative for fracture or focal lesion. Sinuses/Orbits: No acute finding. Other: A right frontal scalp soft tissue defect is noted. IMPRESSION: 1. Right frontal scalp soft tissue defect without evidence of acute fracture or acute intracranial abnormality. 2. Generalized cerebral atrophy. Electronically Signed   By: Virgina Norfolk M.D.   On: 11/02/2021 17:08  ? ?CT Cervical Spine Wo Contrast ? ?Result Date: 11/02/2021 ?CLINICAL DATA:  Status post fall. EXAM: CT CERVICAL SPINE WITHOUT CONTRAST TECHNIQUE: Multidetector CT imaging of the cervical spine was performed without intravenous contrast. Multiplanar CT image reconstructions were also generated. RADIATION DOSE REDUCTION: This exam was performed according to the departmental dose-optimization program which includes automated exposure control, adjustment of the mA and/or kV according to patient size and/or use of iterative reconstruction  technique. COMPARISON:  None. FINDINGS: Alignment: There is straightening the normal cervical lordosis. Skull base and vertebrae: No acute fracture. No primary bone lesion or focal pathologic process. Soft tissues and spinal canal: No prevertebral fluid or swelling. No visible canal hematoma. Disc levels: Marked severity endplate sclerosis and osteophyte formation are seen at the levels of C2-C3, C3-C4, C6-C7 and C7-T1. There is fusion of the C4-C5 and C5-C6 levels. Marked severity intervertebral disc space narrowing is seen at C2-C3, C3-C4, C6-C7 and C7-T1. Bilateral marked severity multilevel facet joint hypertrophy is noted. Upper chest: Negative. Other: None. IMPRESSION: 1. No acute fracture or subluxation of the cervical spine. 2. Marked severity multilevel degenerative changes with fusion of the C4-C5 and C5-C6 levels. Electronically Signed   By: Virgina Norfolk M.D.   On: 11/02/2021 17:11   ? ?Procedures ?Marland Kitchen.Laceration Repair ? ?Date/Time: 11/02/2021 7:48 PM ?Performed by: Anselmo Pickler, PA-C ?Authorized  by: Anselmo Pickler, PA-C  ? ?Consent:  ?  Consent obtained:  Verbal ?  Consent given by:  Patient ?  Risks, benefits, and alternatives were discussed: yes   ?  Risks discussed:  Infection, pain, poor cosmetic result and poor wound healing ?  Alternatives discussed:  No treatment ?Universal protocol:  ?  Procedure explained and questions answered to patient or proxy's satisfaction: yes   ?  Patient identity confirmed:  Verbally with patient ?Anesthesia:  ?  Anesthesia method:  Local infiltration ?  Local anesthetic:  Lidocaine 2% WITH epi ?Laceration details:  ?  Location:  Scalp ?  Scalp location:  Frontal ?  Length (cm):  8 ?  Depth (mm):  4 ?Treatment:  ?  Area cleansed with:  Povidone-iodine and saline ?  Amount of cleaning:  Standard ?  Irrigation solution:  Sterile saline ?Skin repair:  ?  Repair method:  Sutures and staples ?  Suture size:  4-0 ?  Suture material:  Prolene ?  Suture  technique:  Simple interrupted ?  Number of sutures:  7 ?  Number of staples:  5 ?Approximation:  ?  Approximation:  Close ?Repair type:  ?  Repair type:  Simple ?Post-procedure details:  ?  Dressing:  Non-ad

## 2021-11-02 NOTE — ED Triage Notes (Signed)
Pt here with reports of mechanical fall with lac to head. Bleeding controlled in a bandage.  ?

## 2021-11-02 NOTE — Discharge Instructions (Addendum)
Please use Tylenol or ibuprofen for pain.  You may use 600 mg ibuprofen every 6 hours or 1000 mg of Tylenol every 6 hours.  You may choose to alternate between the 2.  This would be most effective.  Not to exceed 4 g of Tylenol within 24 hours.  Not to exceed 3200 mg ibuprofen 24 hours. ? ?Please monitor for signs of infection including worsening redness, swelling, pain, pus draining from the wound as these may be signs of infection. ? ?I recommend changing the bandage daily, placing some polysporin over top of the wound. You can wash your hair with some gentle baby shampoo directly over the wound. ? ?Please return in 7-10 days for suture, staple removal. ?

## 2021-11-02 NOTE — ED Triage Notes (Signed)
Pt here with laceration to top of head; large laceration noted; bandage placed and pt to go to ED with family  ?

## 2021-11-02 NOTE — ED Provider Triage Note (Signed)
Emergency Medicine Provider Triage Evaluation Note ? ?Rebecca Estes , a 88 y.o. female  was evaluated in triage.  Pt complains of mechanical fall. She tripped and fell. Landed on ground with head. Has large forehead/scalp laceration. Bleeding controlled. Did not lose consiousness. She has a slight headache. No other neuro symptoms.  ? ?Review of Systems  ?Positive:  ?Negative:  ? ?Physical Exam  ?BP (!) 156/94 (BP Location: Left Arm)   Pulse 74   Temp 98.6 ?F (37 ?C) (Oral)   Resp 16   SpO2 97%  ?Gen:   Awake, no distress   ?Resp:  Normal effort  ?MSK:   Moves extremities without difficulty  ?Other:   ? ? ? ? ?Medical Decision Making  ?Medically screening exam initiated at 4:46 PM.  Appropriate orders placed.  Rebecca Estes was informed that the remainder of the evaluation will be completed by another provider, this initial triage assessment does not replace that evaluation, and the importance of remaining in the ED until their evaluation is complete. ? ? ?  ?Adolphus Birchwood, PA-C ?11/02/21 1647 ? ?

## 2021-11-07 NOTE — Progress Notes (Signed)
Erroneous encounter

## 2021-11-08 ENCOUNTER — Other Ambulatory Visit: Payer: Self-pay

## 2021-11-08 ENCOUNTER — Emergency Department (HOSPITAL_COMMUNITY)
Admission: EM | Admit: 2021-11-08 | Discharge: 2021-11-08 | Disposition: A | Discharge status: Home / Self Care | Payer: Medicare HMO | Attending: Emergency Medicine | Admitting: Emergency Medicine

## 2021-11-08 DIAGNOSIS — W19XXXD Unspecified fall, subsequent encounter: Secondary | ICD-10-CM | POA: Diagnosis not present

## 2021-11-08 DIAGNOSIS — S0181XD Laceration without foreign body of other part of head, subsequent encounter: Secondary | ICD-10-CM | POA: Insufficient documentation

## 2021-11-08 DIAGNOSIS — Z7982 Long term (current) use of aspirin: Secondary | ICD-10-CM | POA: Diagnosis not present

## 2021-11-08 DIAGNOSIS — Z4802 Encounter for removal of sutures: Secondary | ICD-10-CM | POA: Insufficient documentation

## 2021-11-08 NOTE — Discharge Instructions (Signed)
Your staples and sutures were successfully removed today.  Continue caring for them at home with gentle cleansing with soap and water, and an ointment such as Neosporin or Aquaphor which may help with scar prevention.  ? ?Your blood pressure was a little bit elevated today at 337 systolic-I would recheck this at home when you are relaxed and if it continues to be elevated, follow-up with your primary care physician. ?

## 2021-11-08 NOTE — ED Triage Notes (Signed)
Pt here for suture and staple removal from R anterior head. Pt had them placed here after a mechanical fall on 3/28, pt followed up with PCP, who refused to remove the sutures and staples and told pt she had to get them removed here. Pt has no complaints, denies pain. ?

## 2021-11-08 NOTE — ED Provider Notes (Signed)
?[facility] ?Provider Note ? ? ?CSN: [id number] ?Arrival date & time: 11/08/21  1118 ? ?  ? ?History ? ?Chief Complaint  ?Patient presents with  ? Suture / Staple Removal  ? ? ?Rebecca Estes is a 88 y.o. female who presents to the ED for evaluation of suture and staple removals from the right anterior head after a fall on 11/02/2021.  Patient has been caring for the wound as directed.  She followed up with her PCP per ER instructions for removal, however they refused to remove the sutures and referred her back to the emergency department for removal instead.  She has no other complaints. ? ? ?Suture / Staple Removal ? ? ?  ? ?Home Medications ?Prior to Admission medications   ?Medication Sig Start Date End Date Taking? Authorizing Provider  ?alendronate (FOSAMAX) 70 MG tablet Take 1 tablet (70 mg total) by mouth once a week. Take with a full glass of water on an empty stomach. 10/08/19   Lucious Groves, DO  ?amLODipine (NORVASC) 10 MG tablet Take 1 tablet (10 mg total) by mouth daily. 08/04/21   Camillia Herter, NP  ?aspirin EC 325 MG tablet Take 325 mg by mouth every morning.     [provider]  ?Cholecalciferol (VITAMIN D3) 25 MCG (1000 UT) CAPS Vitamin D3 1,000 unit capsule  Take 1 capsule every day by oral route. 03/06/19   Harvie Heck, MD  ?diclofenac Sodium (VOLTAREN) 1 % GEL Apply 2 g topically 4 (four) times daily. 10/20/20   Wieters, Hallie C, PA-C  ?hydrochlorothiazide (HYDRODIURIL) 50 MG tablet Take 1 tablet (50 mg total) by mouth daily. 08/04/21   Camillia Herter, NP  ?metoprolol succinate (TOPROL XL) 50 MG 24 hr tablet Take 1 tablet (50 mg total) by mouth daily. Take with or immediately following a meal. 10/08/19 10/07/20  Lucious Groves, DO  ?pravastatin (PRAVACHOL) 40 MG tablet Take 1 tablet (40 mg total) by mouth every evening. 11/06/20 11/06/21  Nicolette Bang, MD  ?tiZANidine (ZANAFLEX) 2 MG tablet Take 1 tablet (2 mg total) by mouth 2 (two)  times daily as needed for muscle spasms. 03/23/20   Ok Edwards, PA-C  ?   ? ?Allergies    ?Patient has no known allergies.   ? ?Review of Systems   ?Review of Systems ? ?Physical Exam ?Updated Vital Signs ?BP (!) 180/86 (BP Location: Right Arm)   Pulse (!) 119   Temp (!) 97.5 ?F (36.4 ?C) (Oral)   Resp 17   SpO2 92%  ?Physical Exam ?Vitals and nursing note reviewed.  ?Constitutional:   ?   General: She is not in acute distress. ?   Appearance: She is not ill-appearing.  ?HENT:  ?   Head: Atraumatic.  ?   Comments: Well-healed approximately 8cm  Laceration to the right anterior forehead extending into the scalp with transition from suture to staples at the hair line ?Eyes:  ?   Conjunctiva/sclera: Conjunctivae normal.  ?Cardiovascular:  ?   Rate and Rhythm: Normal rate and regular rhythm.  ?   Pulses: Normal pulses.  ?   Heart sounds: No murmur heard. ?Pulmonary:  ?   Effort: Pulmonary effort is normal. No respiratory distress.  ?   Breath sounds: Normal breath sounds.  ?Abdominal:  ?   General: Abdomen is flat. There is no distension.  ?   Palpations: Abdomen is soft.  ?   Tenderness: There is no abdominal tenderness.  ?  Musculoskeletal:     ?   General: Normal range of motion.  ?   Cervical back: Normal range of motion.  ?Skin: ?   General: Skin is warm and dry.  ?   Capillary Refill: Capillary refill takes less than 2 seconds.  ?Neurological:  ?   General: No focal deficit present.  ?   Mental Status: She is alert.  ?Psychiatric:     ?   Mood and Affect: Mood normal.  ? ? ?ED Results / Procedures / Treatments   ?Labs ?(all labs ordered are listed, but only abnormal results are displayed) ?Labs Reviewed - No data to display ? ?EKG ?None ? ?Radiology ?No results found. ? ?Procedures ?Marland KitchenSuture Removal ? ?Date/Time: 11/08/2021 12:07 PM ?Performed by: Tonye Pearson, PA-C ?Authorized by: Tonye Pearson, PA-C  ? ?Consent:  ?  Consent obtained:  Verbal ?  Risks, benefits, and alternatives were discussed: yes   ?  Risks  discussed:  Bleeding and wound separation ?  Alternatives discussed:  No treatment and delayed treatment ?Universal protocol:  ?  Patient identity confirmed:  Verbally with patient ?Procedure details:  ?  Wound appearance:  No signs of infection and good wound healing ?  Number of sutures removed:  7 ?  Number of staples removed:  5 ?Post-procedure details:  ?  Post-removal:  No dressing applied ?  Procedure completion:  Tolerated  ? ? ?Medications Ordered in ED ?Medications - No data to display ? ?ED Course/ Medical Decision Making/ A&P ?  ?                        ?Medical Decision Making ? ?88 year old female presents to the ED for suture and staple removal after head injury 1 week ago.  Wound is well-healing without drainage, discharge or erythema.  7 sutures and 5 staples removed without dehiscence, recurrent bleeding or trauma.  Continued wound care was discussed with patient and husband at bedside.  All questions were asked and answered and she was discharged home in good condition. ? ?Final Clinical Impression(s) / ED Diagnoses ?Final diagnoses:  ?Visit for suture removal  ? ? ?Rx / DC Orders ?ED Discharge Orders   ? ? None  ? ?  ? ? ?  ?Tonye Pearson, [place] ?11/08/21 1209 ? ?  ?Pattricia Boss, MD ?11/09/21 1047 ? ?

## 2021-11-08 NOTE — ED Notes (Signed)
RN reviewed discharge instructions w/ pt. Follow up and wound care reviewed, pt had no further questions. ?

## 2021-11-10 ENCOUNTER — Encounter: Payer: Medicare HMO | Admitting: Family

## 2021-11-10 DIAGNOSIS — S0101XD Laceration without foreign body of scalp, subsequent encounter: Secondary | ICD-10-CM

## 2021-11-10 DIAGNOSIS — S0990XD Unspecified injury of head, subsequent encounter: Secondary | ICD-10-CM

## 2021-11-10 DIAGNOSIS — Z09 Encounter for follow-up examination after completed treatment for conditions other than malignant neoplasm: Secondary | ICD-10-CM

## 2021-11-13 NOTE — Progress Notes (Signed)
? ? ?Patient ID: Rebecca Estes, female    DOB: 12-06-1933  MRN: [id number] ? ?CC: Emergency Department Follow-Up  ? ?Subjective: ?Rebecca Estes is a 88 y.o. female who presents for emergency department follow-up. She is accompanied by her husband.  ? ?Her concerns today include:  ?EMERGENCY DEPARTMENT FOLLOW-UP: ?11/08/2021 [facility] per MD note: ?88 year old female presents to the ED for suture and staple removal after head injury 1 week ago.  Wound is well-healing without drainage, discharge or erythema.  7 sutures and 5 staples removed without dehiscence, recurrent bleeding or trauma.  Continued wound care was discussed with patient and husband at bedside.  All questions were asked and answered and she was discharged home in good condition. ? ?11/16/2021: ?Reports head doing well since suture removal. No issues/concerns. ? ?2. EMERGENCY DEPARTMENT FOLLOW-UP: ?11/14/2021 [facility] per DO note: ?Medical Decision Making ?Pt complains of swelling and pain to left thumb and hand ?  ?Problems Addressed: ?Left hand pain: acute illness or injury ?  ?Amount and/or Complexity of Data Reviewed ?Independent Historian: spouse ?Radiology: ordered and independent interpretation performed. Decision-making details documented in ED Course. ?   Details: xray ordered reviewed and interpreted as no acute abnormality ?  ?Risk ?Prescription drug management. ?Risk Details: I suspect arthritis, possibly gout.  I will try pt on prednisone to reduce inflammation.  Pt advised to see her MD for recheck this week for recheck  ?  ?11/16/2021: ?Left hand thumb area with persisting pain. Has some swelling but has improved over the last couple days. Reports swelling was not there initially when she had the fall. Wearing an ace bandage to help. Still taking Prednisone.  ? ?Patient Active Problem List  ? Diagnosis Date Noted  ? Ceruminosis, right 10/04/2019  ? Back muscle spasm 02/15/2019  ? Muscle cramp, nocturnal  12/27/2018  ? Cough 10/12/2018  ? Hernia of abdominal wall 10/12/2018  ? Aortic atherosclerosis ([place]) 07/24/2018  ? Chronic pain of both shoulders 07/24/2018  ? BMI less than 19,adult 02/01/2018  ? Difficulty chewing due to dentures 02/01/2018  ? Thoracic ascending aortic aneurysm ([place]) 11/06/2017  ? Palpitations 10/05/2017  ? Seasonal allergies 09/18/2017  ? Essential hypertension 05/26/2016  ? Osteoporosis 05/26/2016  ? Subclinical hyperthyroidism 05/26/2016  ?  ? ?Current Outpatient Medications on File Prior to Visit  ?Medication Sig Dispense Refill  ? alendronate (FOSAMAX) 70 MG tablet Take 1 tablet (70 mg total) by mouth once a week. Take with a full glass of water on an empty stomach. 12 tablet 3  ? amLODipine (NORVASC) 10 MG tablet Take 1 tablet (10 mg total) by mouth daily. 90 tablet 0  ? aspirin EC 325 MG tablet Take 325 mg by mouth every morning.     ? Cholecalciferol (VITAMIN D3) 25 MCG (1000 UT) CAPS Vitamin D3 1,000 unit capsule  Take 1 capsule every day by oral route. 60 capsule 2  ? diclofenac Sodium (VOLTAREN) 1 % GEL Apply 2 g topically 4 (four) times daily. 150 g 0  ? hydrochlorothiazide (HYDRODIURIL) 50 MG tablet Take 1 tablet (50 mg total) by mouth daily. 90 tablet 0  ? metoprolol succinate (TOPROL XL) 50 MG 24 hr tablet Take 1 tablet (50 mg total) by mouth daily. Take with or immediately following a meal. 90 tablet 3  ? pravastatin (PRAVACHOL) 40 MG tablet Take 1 tablet (40 mg total) by mouth every evening. 30 tablet 0  ? predniSONE (DELTASONE) 50 MG tablet One tablet a day 5  tablet 0  ? tiZANidine (ZANAFLEX) 2 MG tablet Take 1 tablet (2 mg total) by mouth 2 (two) times daily as needed for muscle spasms. 10 tablet 0  ? ?No current facility-administered medications on file prior to visit.  ? ? ?No Known Allergies ? ?Social History  ? ?Socioeconomic History  ? Marital status: Married  ?  Spouse name: Not on file  ? Number of children: 5  ? Years of education: 40  ? Highest education level: Not on  file  ?Occupational History  ? Not on file  ?Tobacco Use  ? Smoking status: Never  ? Smokeless tobacco: Never  ?Substance and Sexual Activity  ? Alcohol use: No  ? Drug use: No  ? Sexual activity: Not on file  ?Other Topics Concern  ? Not on file  ?Social History Narrative  ? Denies abuse and feels safe at home.   ? ?Social Determinants of Health  ? ?Financial Resource Strain: Not on file  ?Food Insecurity: Not on file  ?Transportation Needs: Not on file  ?Physical Activity: Not on file  ?Stress: Not on file  ?Social Connections: Not on file  ?Intimate Partner Violence: Not on file  ? ? ?Family History  ?Problem Relation Age of Onset  ? Aneurysm Father 17  ?     Brain  ? Other Mother 91  ?     Blood Infection  ? Coronary artery disease Brother 51  ? Stroke Brother 41  ? Aneurysm Sister 55  ?     Brain  ? Cervical cancer Sister 30  ? Kidney failure Sister 65  ?     was on Dialysis  ? Aneurysm Sister   ?     Brain  ? ? ?Past Surgical History:  ?Procedure Laterality Date  ? ANTERIOR CERVICAL DECOMP/DISCECTOMY FUSION    ? x3  ? BREAST BIOPSY Left 02/10/2009  ? NECK SURGERY    ? ROTATOR CUFF REPAIR    ? Left  ? TUBAL LIGATION    ? UMBILICAL HERNIA REPAIR    ? ? ?ROS: ?Review of Systems ?Negative except as stated above ? ?PHYSICAL EXAM: ?BP 137/81 (BP Location: Left Arm, Patient Position: Sitting, Cuff Size: Normal)   Pulse 76   Temp 98.3 ?F (36.8 ?C)   Resp 18   Ht 5' 4.57" (1.64 m)   Wt 110 lb (49.9 kg)   SpO2 97%   BMI 18.55 kg/m?  ? ?Physical Exam ?HENT:  ?   Head: Normocephalic and atraumatic.  ?   Comments: Wound well-healed without drainage, discharge or erythema right anterior forehead.   ? ? ?Eyes:  ?   Extraocular Movements: Extraocular movements intact.  ?   Conjunctiva/sclera: Conjunctivae normal.  ?   Pupils: Pupils are equal, round, and reactive to light.  ?Cardiovascular:  ?   Rate and Rhythm: Normal rate and regular rhythm.  ?   Pulses: Normal pulses.  ?   Heart sounds: Normal heart sounds.   ?Pulmonary:  ?   Effort: Pulmonary effort is normal.  ?   Breath sounds: Normal breath sounds.  ?Musculoskeletal:  ?   Cervical back: Normal range of motion and neck supple.  ?Skin: ?   General: Skin is warm and dry.  ?Neurological:  ?   General: No focal deficit present.  ?   Mental Status: She is alert and oriented to person, place, and time.  ?Psychiatric:     ?   Mood and Affect: Mood normal.     ?  Behavior: Behavior normal.  ? ? ?ASSESSMENT AND PLAN: ?1. Visit for suture removal: ?- Wound well-healed without drainage, discharge or erythema right anterior forehead.   ? ?2. Left hand pain: ?3. Arthritis of carpometacarpal joint: ?- Referral to Orthopedic Surgery for further evaluation and management. ?- Ambulatory referral to Orthopedic Surgery ? ? ?Patient was given the opportunity to ask questions.  Patient verbalized understanding of the plan and was able to repeat key elements of the plan. Patient was given clear instructions to go to Emergency Department or return to medical center if symptoms don't improve, worsen, or new problems develop.The patient verbalized understanding. ? ? ?Orders Placed This Encounter  ?Procedures  ? Ambulatory referral to Orthopedic Surgery  ? ? ? ?Requested Prescriptions  ? ? No prescriptions requested or ordered in this encounter  ? ? ?Follow-up with primary provider as scheduled. ? ?Camillia Herter, NP  ?

## 2021-11-14 ENCOUNTER — Emergency Department (HOSPITAL_COMMUNITY): Payer: Medicare HMO

## 2021-11-14 ENCOUNTER — Encounter (HOSPITAL_COMMUNITY): Payer: Self-pay | Admitting: Emergency Medicine

## 2021-11-14 ENCOUNTER — Other Ambulatory Visit: Payer: Self-pay

## 2021-11-14 ENCOUNTER — Emergency Department (HOSPITAL_COMMUNITY)
Admission: EM | Admit: 2021-11-14 | Discharge: 2021-11-14 | Disposition: A | Discharge status: Home / Self Care | Payer: Medicare HMO | Attending: Emergency Medicine | Admitting: Emergency Medicine

## 2021-11-14 DIAGNOSIS — M79642 Pain in left hand: Secondary | ICD-10-CM | POA: Insufficient documentation

## 2021-11-14 DIAGNOSIS — M7989 Other specified soft tissue disorders: Secondary | ICD-10-CM | POA: Diagnosis not present

## 2021-11-14 DIAGNOSIS — Z7982 Long term (current) use of aspirin: Secondary | ICD-10-CM | POA: Insufficient documentation

## 2021-11-14 MED ORDER — PREDNISONE 50 MG PO TABS: ORAL_TABLET | ORAL | 0 refills | Status: DC

## 2021-11-14 NOTE — ED Triage Notes (Signed)
L hand pain and swelling x 1 month.  No known injury. ?

## 2021-11-14 NOTE — ED Notes (Signed)
Pt verbalizes understanding of discharge instructions. Opportunity for questions and answers were provided. Pt discharged from the ED.   ?

## 2021-11-14 NOTE — Discharge Instructions (Signed)
See your Physicain for recheck.  Return if any problems.  

## 2021-11-14 NOTE — ED Provider Notes (Signed)
?[facility] ?Provider Note ? ? ?CSN: [id number] ?Arrival date & time: 11/14/21  0849 ? ?  ? ?History ? ?Chief Complaint  ?Patient presents with  ? Hand Pain  ? ? ?Rebecca Estes is a 88 y.o. female. ? ?Pt complains of swelling to her left hand.  Pt reports her hand has been painful and swollen.  She denies any injury.  No fever or chills.  Pt complains of pain with moving.  ? ?The history is provided by the patient. No language interpreter was used.  ?Hand Pain ?This is a new problem. The current episode started more than 1 week ago. The problem occurs constantly. The problem has been gradually worsening.  ? ?  ? ?Home Medications ?Prior to Admission medications   ?Medication Sig Start Date End Date Taking? Authorizing Provider  ?predniSONE (DELTASONE) 50 MG tablet One tablet a day 11/14/21  Yes Caryl Ada K, PA-C  ?alendronate (FOSAMAX) 70 MG tablet Take 1 tablet (70 mg total) by mouth once a week. Take with a full glass of water on an empty stomach. 10/08/19   Lucious Groves, DO  ?amLODipine (NORVASC) 10 MG tablet Take 1 tablet (10 mg total) by mouth daily. 08/04/21   Camillia Herter, NP  ?aspirin EC 325 MG tablet Take 325 mg by mouth every morning.     [provider]  ?Cholecalciferol (VITAMIN D3) 25 MCG (1000 UT) CAPS Vitamin D3 1,000 unit capsule  Take 1 capsule every day by oral route. 03/06/19   Harvie Heck, MD  ?diclofenac Sodium (VOLTAREN) 1 % GEL Apply 2 g topically 4 (four) times daily. 10/20/20   Wieters, Hallie C, PA-C  ?hydrochlorothiazide (HYDRODIURIL) 50 MG tablet Take 1 tablet (50 mg total) by mouth daily. 08/04/21   Camillia Herter, NP  ?metoprolol succinate (TOPROL XL) 50 MG 24 hr tablet Take 1 tablet (50 mg total) by mouth daily. Take with or immediately following a meal. 10/08/19 10/07/20  Lucious Groves, DO  ?pravastatin (PRAVACHOL) 40 MG tablet Take 1 tablet (40 mg total) by mouth every evening. 11/06/20 11/06/21  Nicolette Bang, MD   ?tiZANidine (ZANAFLEX) 2 MG tablet Take 1 tablet (2 mg total) by mouth 2 (two) times daily as needed for muscle spasms. 03/23/20   Ok Edwards, PA-C  ?   ? ?Allergies    ?Patient has no known allergies.   ? ?Review of Systems   ?Review of Systems  ?All other systems reviewed and are negative. ? ?Physical Exam ?Updated Vital Signs ?BP (!) 151/88   Pulse 60   Temp 98.1 ?F (36.7 ?C) (Oral)   Resp 18   SpO2 100%  ?Physical Exam ?Vitals reviewed.  ?Constitutional:   ?   Appearance: Normal appearance.  ?Cardiovascular:  ?   Rate and Rhythm: Normal rate.  ?   Pulses: Normal pulses.  ?Pulmonary:  ?   Effort: Pulmonary effort is normal.  ?Musculoskeletal:     ?   General: Swelling and tenderness present.  ?   Comments: Tender left thumb at mcp and thenar prominence, swollen,  nv and ns intact.   ?Neurological:  ?   Mental Status: She is alert.  ?Psychiatric:     ?   Mood and Affect: Mood normal.  ? ? ?ED Results / Procedures / Treatments   ?Labs ?(all labs ordered are listed, but only abnormal results are displayed) ?Labs Reviewed - No data to display ? ?EKG ?None ? ?Radiology ?DG  Hand Complete Left ? ?Result Date: 11/14/2021 ?CLINICAL DATA:  Left hand pain and swelling. EXAM: LEFT HAND - COMPLETE 3+ VIEW COMPARISON:  None. FINDINGS: Three view study. No fracture. No subluxation or dislocation. Degenerative changes are seen in the first carpometacarpal joint. IMPRESSION: Negative. Electronically Signed   By: Misty Stanley M.D.   On: 11/14/2021 10:32   ? ?Procedures ?Procedures  ? ? ?Medications Ordered in ED ?Medications - No data to display ? ?ED Course/ Medical Decision Making/ A&P ?  ?                        ?Medical Decision Making ?Pt complains of swelling and pain to left thumb and hand ? ?Problems Addressed: ?Left hand pain: acute illness or injury ? ?Amount and/or Complexity of Data Reviewed ?Independent Historian: spouse ?Radiology: ordered and independent interpretation performed. Decision-making details documented  in ED Course. ?   Details: xray ordered reviewed and interpreted as no acute abnormality ? ?Risk ?Prescription drug management. ?Risk Details: I suspect arthritis, possibly gout.  I will try pt on prednisone to reduce inflammation.  Pt advised to see her MD for recheck this week for recheck  ? ? ? ? ? ? ? ? ? ? ?Final Clinical Impression(s) / ED Diagnoses ?Final diagnoses:  ?Left hand pain  ? ? ?Rx / DC Orders ?ED Discharge Orders   ? ?      Ordered  ?  predniSONE (DELTASONE) 50 MG tablet       ? 11/14/21 1109  ? ?  ?  ? ?  ? ?An After Visit Summary was printed and given to the patient.  ?  ?Fransico Meadow, PA-C ?11/14/21 1307 ? ?  ?Lianne Cure, DO ?31/51/76 1447 ? ?

## 2021-11-14 NOTE — ED Notes (Signed)
Patient transported to X-ray 

## 2021-11-16 ENCOUNTER — Encounter: Payer: Self-pay | Admitting: Family

## 2021-11-16 ENCOUNTER — Ambulatory Visit (INDEPENDENT_AMBULATORY_CARE_PROVIDER_SITE_OTHER): Payer: Medicare HMO | Admitting: Family

## 2021-11-16 ENCOUNTER — Ambulatory Visit: Payer: Medicare HMO | Admitting: Family

## 2021-11-16 VITALS — BP 137/81 | HR 76 | Temp 98.3°F | Resp 18 | Ht 64.57 in | Wt 110.0 lb

## 2021-11-16 DIAGNOSIS — M79642 Pain in left hand: Secondary | ICD-10-CM

## 2021-11-16 DIAGNOSIS — Z4802 Encounter for removal of sutures: Secondary | ICD-10-CM

## 2021-11-16 DIAGNOSIS — M19049 Primary osteoarthritis, unspecified hand: Secondary | ICD-10-CM | POA: Diagnosis not present

## 2021-11-16 NOTE — Progress Notes (Signed)
Pt present for ED follow-up from 4/3 and pt fell again on 4/9 states not in pain but a lot of swelling in left knee and wrist ?

## 2021-11-24 ENCOUNTER — Ambulatory Visit (HOSPITAL_BASED_OUTPATIENT_CLINIC_OR_DEPARTMENT_OTHER): Payer: Medicare HMO | Admitting: Cardiovascular Disease

## 2021-12-01 ENCOUNTER — Ambulatory Visit: Payer: Self-pay

## 2021-12-01 ENCOUNTER — Ambulatory Visit: Payer: Medicare HMO | Admitting: Orthopaedic Surgery

## 2021-12-01 DIAGNOSIS — G8929 Other chronic pain: Secondary | ICD-10-CM

## 2021-12-01 DIAGNOSIS — M25562 Pain in left knee: Secondary | ICD-10-CM | POA: Diagnosis not present

## 2021-12-01 MED ORDER — LIDOCAINE HCL 1 % IJ SOLN
2.0000 mL | INTRAMUSCULAR | Status: AC | PRN
  Administered 2021-12-01: 2 mL

## 2021-12-01 MED ORDER — METHYLPREDNISOLONE ACETATE 40 MG/ML IJ SUSP
80.0000 mg | INTRAMUSCULAR | Status: AC | PRN
  Administered 2021-12-01: 80 mg via INTRA_ARTICULAR

## 2021-12-01 NOTE — Progress Notes (Signed)
? ?Office Visit Note ?  ?Patient: Rebecca Estes           ?Date of Birth: 1934-06-18           ?MRN: [id number] ?Visit Date: 12/01/2021 ?             ?Requested by: Camillia Herter, NP ?[address] ?[place],  [place] [address] ?PCP: Camillia Herter, NP ? ?Left knee pain ? ? ? ?HPI: Xiara is a pleasant 88 year old woman with arthritis in her left knee.  She also has findings consistent with some CPPD.  I injected her knee approximately 3 months ago.  She had good significant relief until a couple weeks ago.  She comes in with her husband today.  She denies any injuries since her last visit ? ? ?Assessment & Plan: ?Visit Diagnoses:  ?Left knee pain ? ?Plan: We went forward with another left knee injection of cortisone today.  Hopefully this will give her some good relief.  If she had short-term relief I discussed with her and her husband possibly viscosupplementation.  She will call us if she does not get good relief.  If she wants to continue with periodic steroid injection she certainly could do that.  I have also advised her to start using Voltaren gel ? ?Follow-Up Instructions:  ? ?Ortho Exam ? ?Patient is alert, oriented, no adenopathy, well-dressed, normal affect, normal respiratory effort. ?Examination of her left knee no effusion no redness she has tenderness over the medial greater than lateral joint line.  No signs of infection no warmth she has crepitus with range of motion ? ?Imaging: ?No results found. ?No images are attached to the encounter. ? ?Labs: ?Lab Results  ?Component Value Date  ? HGBA1C 5.2 09/18/2017  ? REPTSTATUS 05/08/2008 FINAL 05/05/2008  ? CULT ENTEROBACTER CLOACAE 05/05/2008  ? [place] ENTEROBACTER CLOACAE 05/05/2008  ? ? ? ?Lab Results  ?Component Value Date  ? ALBUMIN 4.8 (H) 03/31/2020  ? ALBUMIN 4.5 12/27/2018  ? ALBUMIN 3.8 07/01/2017  ? ? ?Lab Results  ?Component Value Date  ? MG 2.1 12/27/2018  ? MG 1.9 07/01/2017  ? ?Lab Results  ?Component Value Date  ? VD25OH  47.2 12/27/2018  ? VD25OH 50.43 05/26/2016  ? ? ?No results found for: PREALBUMIN ? ?  Latest Ref Rng & Units 03/31/2020  ?  9:38 AM 03/26/2020  ?  8:00 PM 10/03/2019  ? 10:46 AM  ?CBC EXTENDED  ?WBC 3.4 - 10.8 x10E3/uL 4.1   3.8   4.0    ?RBC 3.77 - 5.28 x10E6/uL 4.30   4.13   4.00    ?Hemoglobin 11.1 - 15.9 g/dL 13.4   12.9   12.5    ?HCT 34.0 - 46.6 % 39.3   38.9   36.1    ?Platelets 150 - 450 x10E3/uL 207   216   248    ?NEUT# 1.4 - 7.0 x10E3/uL 1.8      ?Lymph# 0.7 - 3.1 x10E3/uL 1.8      ? ? ? ?There is no height or weight on file to calculate BMI. ? ?Orders:  ?Orders Placed This Encounter  ?Procedures  ?? XR KNEE 3 VIEW RIGHT  ? ?No orders of the defined types were placed in this encounter. ? ? ? Procedures: ?Large Joint Inj: L knee on 12/01/2021 11:26 AM ?Indications: pain and diagnostic evaluation ?Details: 25 G 1.5 in needle, anteromedial approach ? ?Arthrogram: No ? ?Medications: 80 mg methylPREDNISolone acetate 40 MG/ML; 2  mL lidocaine 1 % ?Outcome: tolerated well, no immediate complications ?Procedure, treatment alternatives, risks and benefits explained, specific risks discussed. Consent was given by the patient.  ? ? ? ?Clinical Data: ?No additional findings. ? ?ROS: ? ?All other systems negative, except as noted in the HPI. ?Review of Systems ? ?Objective: ?Vital Signs: There were no vitals taken for this visit. ? ?Specialty Comments:  ?No specialty comments available. ? ?PMFS History: ?Patient Active Problem List  ? Diagnosis Date Noted  ?? Ceruminosis, right 10/04/2019  ?? Back muscle spasm 02/15/2019  ?? Muscle cramp, nocturnal 12/27/2018  ?? Cough 10/12/2018  ?? Hernia of abdominal wall 10/12/2018  ?? Aortic atherosclerosis ([place]) 07/24/2018  ?? Chronic pain of both shoulders 07/24/2018  ?? BMI less than 19,adult 02/01/2018  ?? Difficulty chewing due to dentures 02/01/2018  ?? Thoracic ascending aortic aneurysm ([place]) 11/06/2017  ?? Palpitations 10/05/2017  ?? Seasonal allergies 09/18/2017  ??  Essential hypertension 05/26/2016  ?? Osteoporosis 05/26/2016  ?? Subclinical hyperthyroidism 05/26/2016  ? ?Past Medical History:  ?Diagnosis Date  ?? Arrhythmia   ?? Bleeding ulcer   ?? Cancer [facility])   ?? Hiatal hernia   ?? Hypertension   ?? Hyperthyroidism   ?? Osteoporosis   ?? Thoracic ascending aortic aneurysm ([place]) 11/06/2017  ? 60m diagnosed by echo 10/24/17.  ?  ?Family History  ?Problem Relation Age of Onset  ?? Aneurysm Father 878 ?     Brain  ?? Other Mother 751 ?     Blood Infection  ?? Coronary artery disease Brother 666 ?? Stroke Brother 651 ?? Aneurysm Sister 451 ?     Brain  ?? Cervical cancer Sister 336 ?? Kidney failure Sister 562 ?     was on Dialysis  ?? Aneurysm Sister   ?     Brain  ?  ?Past Surgical History:  ?Procedure Laterality Date  ?? ANTERIOR CERVICAL DECOMP/DISCECTOMY FUSION    ? x3  ?? BREAST BIOPSY Left 02/10/2009  ?? NECK SURGERY    ?? ROTATOR CUFF REPAIR    ? Left  ?? TUBAL LIGATION    ?? UMBILICAL HERNIA REPAIR    ? ?Social History  ? ?Occupational History  ?? Not on file  ?Tobacco Use  ?? Smoking status: Never  ?? Smokeless tobacco: Never  ?Substance and Sexual Activity  ?? Alcohol use: No  ?? Drug use: No  ?? Sexual activity: Not on file  ? ? ? ? ? ?

## 2021-12-16 ENCOUNTER — Other Ambulatory Visit: Payer: Self-pay

## 2021-12-16 DIAGNOSIS — I10 Essential (primary) hypertension: Secondary | ICD-10-CM

## 2021-12-16 DIAGNOSIS — I700 Atherosclerosis of aorta: Secondary | ICD-10-CM

## 2021-12-16 MED ORDER — AMLODIPINE BESYLATE 10 MG PO TABS: 10.0000 mg | ORAL_TABLET | Freq: Every day | ORAL | 0 refills | Status: DC

## 2021-12-16 MED ORDER — HYDROCHLOROTHIAZIDE 50 MG PO TABS: 50.0000 mg | ORAL_TABLET | Freq: Every day | ORAL | 0 refills | Status: DC

## 2022-01-10 NOTE — Progress Notes (Signed)
Erroneous encounter-disregard

## 2022-01-17 ENCOUNTER — Encounter: Payer: Medicare HMO | Admitting: Family

## 2022-03-01 NOTE — Progress Notes (Unsigned)
Patient ID: TRINITY HAUN, female    DOB: Nov 06, 1933  MRN: [id number]  CC: Left Knee Pain   Subjective: Rebecca Estes is a 88 y.o. female who presents for left knee pain. She is accompanied by her husband.  Her concerns today include:  12/01/2021 [facility] per MD note: Assessment & Plan: Visit Diagnoses:  Left knee pain   Plan: We went forward with another left knee injection of cortisone today.  Hopefully this will give her some good relief.  If she had short-term relief I discussed with her and her husband possibly viscosupplementation.  She will call us if she does not get good relief.  If she wants to continue with periodic steroid injection she certainly could do that.  I have also advised her to start using Voltaren gel   03/02/2022: Left knee swelling persisting. Denies pain, tenderness, redness of bilateral lower extremities. Denies chest pain, shortness of breath and additional red flag symptoms. Using makeshift knee brace from home. Reports she does not take her blood pressure medications daily because she forgets. She is aware to keep all scheduled appointments with Cardiology. Husband states has noticed short-term memory loss with patient. No further issues/concerns.    Patient Active Problem List   Diagnosis Date Noted   Pain in left knee 12/01/2021   Ceruminosis, right 10/04/2019   Back muscle spasm 02/15/2019   Muscle cramp, nocturnal 12/27/2018   Cough 10/12/2018   Hernia of abdominal wall 10/12/2018   Aortic atherosclerosis ([place]) 07/24/2018   Chronic pain of both shoulders 07/24/2018   BMI less than 19,adult 02/01/2018   Difficulty chewing due to dentures 02/01/2018   Thoracic ascending aortic aneurysm ([place]) 11/06/2017   Palpitations 10/05/2017   Seasonal allergies 09/18/2017   Essential hypertension 05/26/2016   Osteoporosis 05/26/2016   Subclinical hyperthyroidism 05/26/2016     Current Outpatient Medications on File Prior to  Visit  Medication Sig Dispense Refill   alendronate (FOSAMAX) 70 MG tablet Take 1 tablet (70 mg total) by mouth once a week. Take with a full glass of water on an empty stomach. 12 tablet 3   amLODipine (NORVASC) 10 MG tablet Take 1 tablet (10 mg total) by mouth daily. 90 tablet 0   aspirin EC 325 MG tablet Take 325 mg by mouth every morning.      Cholecalciferol (VITAMIN D3) 25 MCG (1000 UT) CAPS Vitamin D3 1,000 unit capsule  Take 1 capsule every day by oral route. 60 capsule 2   diclofenac Sodium (VOLTAREN) 1 % GEL Apply 2 g topically 4 (four) times daily. 150 g 0   hydrochlorothiazide (HYDRODIURIL) 50 MG tablet Take 1 tablet (50 mg total) by mouth daily. 90 tablet 0   metoprolol succinate (TOPROL XL) 50 MG 24 hr tablet Take 1 tablet (50 mg total) by mouth daily. Take with or immediately following a meal. 90 tablet 3   pravastatin (PRAVACHOL) 40 MG tablet Take 1 tablet (40 mg total) by mouth every evening. 30 tablet 0   predniSONE (DELTASONE) 50 MG tablet One tablet a day 5 tablet 0   tiZANidine (ZANAFLEX) 2 MG tablet Take 1 tablet (2 mg total) by mouth 2 (two) times daily as needed for muscle spasms. 10 tablet 0   No current facility-administered medications on file prior to visit.    No Known Allergies  Social History   Socioeconomic History   Marital status: Married    Spouse name: Not on file   Number of children:  5   Years of education: 9   Highest education level: Not on file  Occupational History   Not on file  Tobacco Use   Smoking status: Never    Passive exposure: Never   Smokeless tobacco: Never  Substance and Sexual Activity   Alcohol use: No   Drug use: No   Sexual activity: Not Currently    Partners: Male  Other Topics Concern   Not on file  Social History Narrative   Denies abuse and feels safe at home.    Social Determinants of Health   Financial Resource Strain: Not on file  Food Insecurity: Not on file  Transportation Needs: Not on file  Physical  Activity: Not on file  Stress: Not on file  Social Connections: Not on file  Intimate Partner Violence: Not on file    Family History  Problem Relation Age of Onset   Aneurysm Father 3       Brain   Other Mother 45       Blood Infection   Coronary artery disease Brother 51   Stroke Brother 26   Aneurysm Sister 24       Brain   Cervical cancer Sister 43   Kidney failure Sister 54       was on Dialysis   Aneurysm Sister        Brain    Past Surgical History:  Procedure Laterality Date   ANTERIOR CERVICAL DECOMP/DISCECTOMY FUSION     x3   BREAST BIOPSY Left 02/10/2009   NECK SURGERY     ROTATOR CUFF REPAIR     Left   TUBAL LIGATION     UMBILICAL HERNIA REPAIR      ROS: Review of Systems Negative except as stated above  PHYSICAL EXAM: BP (!) 169/74 (BP Location: Left Arm, Patient Position: Sitting, Cuff Size: Normal)   Pulse 80   Temp 98.3 F (36.8 C)   Resp 16   Ht 5' 4.57" (1.64 m)   Wt 111 lb (50.3 kg)   SpO2 97%   BMI 18.72 kg/m   Physical Exam HENT:     Head: Normocephalic and atraumatic.  Eyes:     Extraocular Movements: Extraocular movements intact.     Conjunctiva/sclera: Conjunctivae normal.     Pupils: Pupils are equal, round, and reactive to light.  Cardiovascular:     Rate and Rhythm: Normal rate and regular rhythm.     Pulses: Normal pulses.     Heart sounds: Normal heart sounds.  Pulmonary:     Effort: Pulmonary effort is normal.     Breath sounds: Normal breath sounds.  Musculoskeletal:     Cervical back: Normal range of motion and neck supple.     Right knee: Normal.     Left knee: Swelling present.     Right lower leg: Normal.     Left lower leg: Normal.     Right ankle: Normal.     Left ankle: Normal.  Neurological:     Mental Status: She is alert. Mental status is at baseline.  Psychiatric:        Mood and Affect: Mood normal.        Behavior: Behavior normal.      ASSESSMENT AND PLAN: 1. Swelling of joint of left  knee - Continue with conservative measures such as but not limited to ice packs, elevation of lower extremities, limiting salt in diet, and over-the-counter analgesics. - Schedule follow-up appointment with established [facility]  [place].  2. Memory loss, short term - Referral to Neurology for further evaluation and management.  - Ambulatory referral to Neurology    Patient was given the opportunity to ask questions.  Patient verbalized understanding of the plan and was able to repeat key elements of the plan. Patient was given clear instructions to go to Emergency Department or return to medical center if symptoms don't improve, worsen, or new problems develop.The patient verbalized understanding.   Orders Placed This Encounter  Procedures   Ambulatory referral to Neurology    Follow-up with primary provider as scheduled.   Camillia Herter, NP

## 2022-03-02 ENCOUNTER — Encounter: Payer: Self-pay | Admitting: Family

## 2022-03-02 ENCOUNTER — Ambulatory Visit (INDEPENDENT_AMBULATORY_CARE_PROVIDER_SITE_OTHER): Payer: Medicare HMO | Admitting: Family

## 2022-03-02 VITALS — BP 169/74 | HR 80 | Temp 98.3°F | Resp 16 | Ht 64.57 in | Wt 111.0 lb

## 2022-03-02 DIAGNOSIS — R413 Other amnesia: Secondary | ICD-10-CM | POA: Diagnosis not present

## 2022-03-02 DIAGNOSIS — M25462 Effusion, left knee: Secondary | ICD-10-CM | POA: Diagnosis not present

## 2022-03-02 NOTE — Progress Notes (Signed)
Pt presents for left knee pain states it been swollen since after her road trip to [place]

## 2022-03-03 ENCOUNTER — Other Ambulatory Visit: Payer: Self-pay | Admitting: Family

## 2022-03-03 DIAGNOSIS — I10 Essential (primary) hypertension: Secondary | ICD-10-CM

## 2022-05-04 ENCOUNTER — Ambulatory Visit: Payer: Medicare HMO | Admitting: Family

## 2022-05-04 DIAGNOSIS — Z13228 Encounter for screening for other metabolic disorders: Secondary | ICD-10-CM

## 2022-05-04 DIAGNOSIS — I7121 Aneurysm of the ascending aorta, without rupture: Secondary | ICD-10-CM

## 2022-05-04 DIAGNOSIS — E0590 Thyrotoxicosis, unspecified without thyrotoxic crisis or storm: Secondary | ICD-10-CM

## 2022-05-04 DIAGNOSIS — R413 Other amnesia: Secondary | ICD-10-CM

## 2022-05-04 DIAGNOSIS — Z0000 Encounter for general adult medical examination without abnormal findings: Secondary | ICD-10-CM

## 2022-05-04 DIAGNOSIS — I10 Essential (primary) hypertension: Secondary | ICD-10-CM

## 2022-05-04 DIAGNOSIS — Z130 Encounter for screening for diseases of the blood and blood-forming organs and certain disorders involving the immune mechanism: Secondary | ICD-10-CM

## 2022-05-04 DIAGNOSIS — Z1231 Encounter for screening mammogram for malignant neoplasm of breast: Secondary | ICD-10-CM

## 2022-05-04 DIAGNOSIS — Z131 Encounter for screening for diabetes mellitus: Secondary | ICD-10-CM

## 2022-05-04 DIAGNOSIS — I700 Atherosclerosis of aorta: Secondary | ICD-10-CM

## 2022-05-04 DIAGNOSIS — G8929 Other chronic pain: Secondary | ICD-10-CM

## 2022-05-09 ENCOUNTER — Encounter: Payer: Self-pay | Admitting: Diagnostic Neuroimaging

## 2022-05-09 ENCOUNTER — Ambulatory Visit: Payer: Medicare HMO | Admitting: Diagnostic Neuroimaging

## 2022-05-09 VITALS — BP 133/81 | HR 71 | Wt 109.5 lb

## 2022-05-09 DIAGNOSIS — F03A0 Unspecified dementia, mild, without behavioral disturbance, psychotic disturbance, mood disturbance, and anxiety: Secondary | ICD-10-CM | POA: Diagnosis not present

## 2022-05-09 MED ORDER — MEMANTINE HCL 10 MG PO TABS
10.0000 mg | ORAL_TABLET | Freq: Two times a day (BID) | ORAL | 12 refills | Status: AC
Start: 2022-05-09 — End: ?

## 2022-05-09 NOTE — Patient Instructions (Signed)
MILD MEMORY LOSS (MMSE 13/30; mild changes in ADLs; likely mild-mod dementia) - start memantine '10mg'$  at bedtime; increase to twice a day after 1-2 weeks - safety / supervision issues reviewed - daily physical activity / exercise (at least 15-30 minutes) - eat more plants / vegetables - increase social activities, brain stimulation, games, puzzles, hobbies, crafts, arts, music - aim for at least 7-8 hours sleep per night (or more) - avoid smoking and alcohol - caution with medications, finances; no driving

## 2022-05-09 NOTE — Progress Notes (Deleted)
[facility]  PATIENT: Rebecca Estes DOB: 12-Jul-1934  REFERRING CLINICIAN: Camillia Herter, NP HISTORY FROM: patient  REASON FOR VISIT: new consult   HISTORICAL  CHIEF COMPLAINT:  Chief Complaint  Patient presents with   New Patient (Initial Visit)    Pt is feeling fine. She states she has her good and bad days. She is always moving with a lot of energy. Room 6 with husband    HISTORY OF PRESENT ILLNESS:      REVIEW OF SYSTEMS: Full 14 system review of systems performed and negative with exception of: ***  ALLERGIES: No Known Allergies  HOME MEDICATIONS: Outpatient Medications Prior to Visit  Medication Sig Dispense Refill   alendronate (FOSAMAX) 70 MG tablet Take 1 tablet (70 mg total) by mouth once a week. Take with a full glass of water on an empty stomach. 12 tablet 3   amLODipine (NORVASC) 10 MG tablet TAKE 1 TABLET (10 MG TOTAL) BY MOUTH DAILY. 90 tablet 0   aspirin EC 325 MG tablet Take 325 mg by mouth every morning.      Cholecalciferol (VITAMIN D3) 25 MCG (1000 UT) CAPS Vitamin D3 1,000 unit capsule  Take 1 capsule every day by oral route. 60 capsule 2   diclofenac Sodium (VOLTAREN) 1 % GEL Apply 2 g topically 4 (four) times daily. 150 g 0   hydrochlorothiazide (HYDRODIURIL) 50 MG tablet TAKE 1 TABLET (50 MG TOTAL) BY MOUTH DAILY. (Patient not taking: Reported on 05/09/2022) 90 tablet 0   metoprolol succinate (TOPROL XL) 50 MG 24 hr tablet Take 1 tablet (50 mg total) by mouth daily. Take with or immediately following a meal. 90 tablet 3   pravastatin (PRAVACHOL) 40 MG tablet Take 1 tablet (40 mg total) by mouth every evening. 30 tablet 0   predniSONE (DELTASONE) 50 MG tablet One tablet a day (Patient not taking: Reported on 05/09/2022) 5 tablet 0   tiZANidine (ZANAFLEX) 2 MG tablet Take 1 tablet (2 mg total) by mouth 2 (two) times daily as needed for muscle spasms. (Patient not taking: Reported on 05/09/2022) 10 tablet 0   No  facility-administered medications prior to visit.    PAST MEDICAL HISTORY: Past Medical History:  Diagnosis Date   Arrhythmia    Bleeding ulcer    Cancer ([place])    Hiatal hernia    Hypertension    Hyperthyroidism    Osteoporosis    Thoracic ascending aortic aneurysm ([place]) 11/06/2017   13m diagnosed by echo 10/24/17.    PAST SURGICAL HISTORY: Past Surgical History:  Procedure Laterality Date   ANTERIOR CERVICAL DECOMP/DISCECTOMY FUSION     x3   BREAST BIOPSY Left 02/10/2009   NECK SURGERY     ROTATOR CUFF REPAIR     Left   TUBAL LIGATION     UMBILICAL HERNIA REPAIR      FAMILY HISTORY: Family History  Problem Relation Age of Onset   Aneurysm Father 818      Brain   Other Mother 713      Blood Infection   Coronary artery disease Brother 625  Stroke Brother 620  Aneurysm Sister 461      Brain   Cervical cancer Sister 334  Kidney failure Sister 529      was on Dialysis   Aneurysm Sister        Brain    SOCIAL HISTORY: Social History   Socioeconomic History   Marital status: Married  Spouse name: Not on file   Number of children: 5   Years of education: 9   Highest education level: Not on file  Occupational History   Not on file  Tobacco Use   Smoking status: Never    Passive exposure: Never   Smokeless tobacco: Never  Substance and Sexual Activity   Alcohol use: No   Drug use: No   Sexual activity: Not Currently    Partners: Male  Other Topics Concern   Not on file  Social History Narrative   Denies abuse and feels safe at home.    Social Determinants of Health   Financial Resource Strain: Not on file  Food Insecurity: Not on file  Transportation Needs: Not on file  Physical Activity: Not on file  Stress: Not on file  Social Connections: Not on file  Intimate Partner Violence: Not on file     PHYSICAL EXAM  ***  GENERAL EXAM/CONSTITUTIONAL: Vitals:  Vitals:   05/09/22 1119  BP: 133/81  Pulse: 71  Weight: 109 lb 8 oz (49.7 kg)    Body mass index is 18.47 kg/m. Wt Readings from Last 3 Encounters:  05/09/22 109 lb 8 oz (49.7 kg)  03/02/22 111 lb (50.3 kg)  11/16/21 110 lb (49.9 kg)   Patient is in no distress; well developed, nourished and groomed; neck is supple  CARDIOVASCULAR: Examination of carotid arteries is normal; no carotid bruits Regular rate and rhythm, no murmurs Examination of peripheral vascular system by observation and palpation is normal  EYES: Ophthalmoscopic exam of optic discs and posterior segments is normal; no papilledema or hemorrhages No results found.  MUSCULOSKELETAL: Gait, strength, tone, movements noted in Neurologic exam below  NEUROLOGIC: MENTAL STATUS:     05/09/2022   11:24 AM  MMSE - Mini Mental State Exam  Orientation to time 0  Orientation to Place 4  Registration 2  Attention/ Calculation 0  Recall 0  Language- name 2 objects 2  Language- repeat 0  Language- follow 3 step command 3  Language- read & follow direction 1  Write a sentence 1  Copy design 0  Total score 13   awake, alert, oriented to person, place and time recent and remote memory intact normal attention and concentration language fluent, comprehension intact, naming intact fund of knowledge appropriate  CRANIAL NERVE:  2nd - no papilledema on fundoscopic exam 2nd, 3rd, 4th, 6th - pupils equal and reactive to light, visual fields full to confrontation, extraocular muscles intact, no nystagmus 5th - facial sensation symmetric 7th - facial strength symmetric 8th - hearing intact 9th - palate elevates symmetrically, uvula midline 11th - shoulder shrug symmetric 12th - tongue protrusion midline  MOTOR:  normal bulk and tone, full strength in the BUE, BLE  SENSORY:  normal and symmetric to light touch, pinprick, temperature, vibration  COORDINATION:  finger-nose-finger, fine finger movements normal  REFLEXES:  deep tendon reflexes present and symmetric  GAIT/STATION:  narrow  based gait; able to walk on toes, heels and tandem; romberg is negative     DIAGNOSTIC DATA (LABS, IMAGING, TESTING) - I reviewed patient records, labs, notes, testing and imaging myself where available.  Lab Results  Component Value Date   WBC 4.1 03/31/2020   HGB 13.4 03/31/2020   HCT 39.3 03/31/2020   MCV 91 03/31/2020   PLT 207 03/31/2020      Component Value Date/Time   NA 142 08/04/2021 1515   K 4.1 08/04/2021 1515   CL 104 08/04/2021 1515  CO2 25 08/04/2021 1515   GLUCOSE 84 08/04/2021 1515   GLUCOSE 90 03/26/2020 2000   BUN 22 08/04/2021 1515   CREATININE 0.80 08/04/2021 1515   CALCIUM 10.1 08/04/2021 1515   PROT 7.3 03/31/2020 0938   ALBUMIN 4.8 (H) 03/31/2020 0938   AST 23 03/31/2020 0938   ALT 17 03/31/2020 0938   ALKPHOS 62 03/31/2020 0938   BILITOT 0.2 03/31/2020 0938   GFRNONAA 76 03/31/2020 0938   GFRAA 87 03/31/2020 0938   Lab Results  Component Value Date   CHOL 240 (H) 03/31/2020   HDL 96 03/31/2020   LDLCALC 132 (H) 03/31/2020   TRIG 71 03/31/2020   CHOLHDL 2.5 03/31/2020   Lab Results  Component Value Date   HGBA1C 5.2 09/18/2017   No results found for: "VITAMINB12" Lab Results  Component Value Date   TSH 0.461 03/31/2020    ***    ASSESSMENT AND PLAN  88 y.o. year old female here with ***   Dx:  No diagnosis found.    PLAN:  ***  No orders of the defined types were placed in this encounter.   No orders of the defined types were placed in this encounter.   No follow-ups on file.    Penni Bombard, MD 99/10/7167, 67:89 AM Certified in Neurology, Neurophysiology and Neuroimaging  [facility] [address], [place], [place] [address] [phone]

## 2022-05-09 NOTE — Progress Notes (Signed)
[facility]  PATIENT: Rebecca Estes DOB: 1934/06/21  REFERRING CLINICIAN: Camillia Herter, NP HISTORY FROM: patient REASON FOR VISIT: new consult   HISTORICAL  CHIEF COMPLAINT:  Chief Complaint  Patient presents with   New Patient (Initial Visit)    Pt is feeling fine. She states she has her good and bad days. She is always moving with a lot of energy. Room 6 with husband    HISTORY OF PRESENT ILLNESS:    88 year old female here for evaluation of memory loss.  Patient has had gradual onset progressive short-term memory loss issues, noted by patient and husband.  Still able to maintain most of her ADLs such as personal hygiene, cooking, bathing, shopping and finances.  She takes care of her medications.  She no longer drives because she felt like she was driving to slowly to keep up with the flow of traffic.  She forgets recent events and conversations.  Sometimes she gets disoriented with familiar locations.   REVIEW OF SYSTEMS: Full 14 system review of systems performed and negative with exception of: as per HPI.  ALLERGIES: No Known Allergies  HOME MEDICATIONS: Outpatient Medications Prior to Visit  Medication Sig Dispense Refill   alendronate (FOSAMAX) 70 MG tablet Take 1 tablet (70 mg total) by mouth once a week. Take with a full glass of water on an empty stomach. 12 tablet 3   amLODipine (NORVASC) 10 MG tablet TAKE 1 TABLET (10 MG TOTAL) BY MOUTH DAILY. 90 tablet 0   aspirin EC 325 MG tablet Take 325 mg by mouth every morning.      Cholecalciferol (VITAMIN D3) 25 MCG (1000 UT) CAPS Vitamin D3 1,000 unit capsule  Take 1 capsule every day by oral route. 60 capsule 2   diclofenac Sodium (VOLTAREN) 1 % GEL Apply 2 g topically 4 (four) times daily. 150 g 0   hydrochlorothiazide (HYDRODIURIL) 50 MG tablet TAKE 1 TABLET (50 MG TOTAL) BY MOUTH DAILY. (Patient not taking: Reported on 05/09/2022) 90 tablet 0   metoprolol succinate (TOPROL XL) 50 MG 24 hr  tablet Take 1 tablet (50 mg total) by mouth daily. Take with or immediately following a meal. 90 tablet 3   pravastatin (PRAVACHOL) 40 MG tablet Take 1 tablet (40 mg total) by mouth every evening. 30 tablet 0   predniSONE (DELTASONE) 50 MG tablet One tablet a day (Patient not taking: Reported on 05/09/2022) 5 tablet 0   tiZANidine (ZANAFLEX) 2 MG tablet Take 1 tablet (2 mg total) by mouth 2 (two) times daily as needed for muscle spasms. (Patient not taking: Reported on 05/09/2022) 10 tablet 0   No facility-administered medications prior to visit.    PAST MEDICAL HISTORY: Past Medical History:  Diagnosis Date   Arrhythmia    Bleeding ulcer    Cancer ([place])    Hiatal hernia    Hypertension    Hyperthyroidism    Osteoporosis    Thoracic ascending aortic aneurysm ([place]) 11/06/2017   60m diagnosed by echo 10/24/17.    PAST SURGICAL HISTORY: Past Surgical History:  Procedure Laterality Date   ANTERIOR CERVICAL DECOMP/DISCECTOMY FUSION     x3   BREAST BIOPSY Left 02/10/2009   NECK SURGERY     ROTATOR CUFF REPAIR     Left   TUBAL LIGATION     UMBILICAL HERNIA REPAIR      FAMILY HISTORY: Family History  Problem Relation Age of Onset   Aneurysm Father 877  Brain   Other Mother 42       Blood Infection   Coronary artery disease Brother 39   Stroke Brother 53   Aneurysm Sister 31       Brain   Cervical cancer Sister 38   Kidney failure Sister 42       was on Dialysis   Aneurysm Sister        Brain    SOCIAL HISTORY: Social History   Socioeconomic History   Marital status: Married    Spouse name: Not on file   Number of children: 5   Years of education: 9   Highest education level: Not on file  Occupational History   Not on file  Tobacco Use   Smoking status: Never    Passive exposure: Never   Smokeless tobacco: Never  Substance and Sexual Activity   Alcohol use: No   Drug use: No   Sexual activity: Not Currently    Partners: Male  Other Topics Concern   Not  on file  Social History Narrative   Denies abuse and feels safe at home.    Social Determinants of Health   Financial Resource Strain: Not on file  Food Insecurity: Not on file  Transportation Needs: Not on file  Physical Activity: Not on file  Stress: Not on file  Social Connections: Not on file  Intimate Partner Violence: Not on file     PHYSICAL EXAM  GENERAL EXAM/CONSTITUTIONAL: Vitals:  Vitals:   05/09/22 1119  BP: 133/81  Pulse: 71  Weight: 109 lb 8 oz (49.7 kg)   Body mass index is 18.47 kg/m. Wt Readings from Last 3 Encounters:  05/09/22 109 lb 8 oz (49.7 kg)  03/02/22 111 lb (50.3 kg)  11/16/21 110 lb (49.9 kg)   Patient is in no distress; well developed, nourished and groomed; neck is supple  CARDIOVASCULAR: Examination of carotid arteries is normal; no carotid bruits Regular rate and rhythm, no murmurs Examination of peripheral vascular system by observation and palpation is normal  EYES: Ophthalmoscopic exam of optic discs and posterior segments is normal; no papilledema or hemorrhages No results found.  MUSCULOSKELETAL: Gait, strength, tone, movements noted in Neurologic exam below  NEUROLOGIC: MENTAL STATUS:     05/09/2022   11:24 AM  MMSE - Mini Mental State Exam  Orientation to time 0  Orientation to Place 4  Registration 2  Attention/ Calculation 0  Recall 0  Language- name 2 objects 2  Language- repeat 0  Language- follow 3 step command 3  Language- read & follow direction 1  Write a sentence 1  Copy design 0  Total score 13   awake, alert, oriented to person [place] attention and concentration language fluent, comprehension intact, naming intact fund of knowledge appropriate  CRANIAL NERVE:  2nd - no papilledema on fundoscopic exam 2nd, 3rd, 4th, 6th - pupils equal and reactive to light, visual fields full to confrontation, extraocular muscles intact, no nystagmus 5th - facial sensation symmetric 7th - facial  strength symmetric 8th - hearing intact 9th - palate elevates symmetrically, uvula midline 11th - shoulder shrug symmetric 12th - tongue protrusion midline  MOTOR:  normal bulk and tone, full strength in the BUE, BLE  SENSORY:  normal and symmetric to light touch, temperature, vibration  COORDINATION:  finger-nose-finger, fine finger movements normal  REFLEXES:  deep tendon reflexes TRACE and symmetric  GAIT/STATION:  narrow based gait     DIAGNOSTIC DATA (LABS, IMAGING, TESTING) -  I reviewed patient records, labs, notes, testing and imaging myself where available.  Lab Results  Component Value Date   WBC 4.1 03/31/2020   HGB 13.4 03/31/2020   HCT 39.3 03/31/2020   MCV 91 03/31/2020   PLT 207 03/31/2020      Component Value Date/Time   NA 142 08/04/2021 1515   K 4.1 08/04/2021 1515   CL 104 08/04/2021 1515   CO2 25 08/04/2021 1515   GLUCOSE 84 08/04/2021 1515   GLUCOSE 90 03/26/2020 2000   BUN 22 08/04/2021 1515   CREATININE 0.80 08/04/2021 1515   CALCIUM 10.1 08/04/2021 1515   PROT 7.3 03/31/2020 0938   ALBUMIN 4.8 (H) 03/31/2020 0938   AST 23 03/31/2020 0938   ALT 17 03/31/2020 0938   ALKPHOS 62 03/31/2020 0938   BILITOT 0.2 03/31/2020 0938   GFRNONAA 76 03/31/2020 0938   GFRAA 87 03/31/2020 0938   Lab Results  Component Value Date   CHOL 240 (H) 03/31/2020   HDL 96 03/31/2020   LDLCALC 132 (H) 03/31/2020   TRIG 71 03/31/2020   CHOLHDL 2.5 03/31/2020   Lab Results  Component Value Date   HGBA1C 5.2 09/18/2017   No results found for: "VITAMINB12" Lab Results  Component Value Date   TSH 0.461 03/31/2020    11/02/21 CT head 1. Right frontal scalp soft tissue defect without evidence of acute fracture or acute intracranial abnormality. 2. Generalized cerebral atrophy.    ASSESSMENT AND PLAN  88 y.o. year old female here with:   Dx:  1. Mild dementia without behavioral disturbance, psychotic disturbance, mood disturbance, or anxiety,  unspecified dementia type (HCC)     PLAN:  MILD MEMORY LOSS (MMSE 13/30; mild changes in ADLs; likely mild-mod dementia) - consider memantine '10mg'$  at bedtime; increase to twice a day after 1-2 weeks - safety / supervision issues reviewed - daily physical activity / exercise (at least 15-30 minutes) - eat more plants / vegetables - increase social activities, brain stimulation, games, puzzles, hobbies, crafts, arts, music - aim for at least 7-8 hours sleep per night (or more) - avoid smoking and alcohol - caution with medications, finances; no driving  Meds ordered this encounter  Medications   memantine (NAMENDA) 10 MG tablet    Sig: Take 1 tablet (10 mg total) by mouth 2 (two) times daily.    Dispense:  60 tablet    Refill:  12   Return for return to PCP, pending if symptoms worsen or fail to improve.    Penni Bombard, MD 43/08/5398, 86:76 AM Certified in Neurology, Neurophysiology and Neuroimaging  [facility] [address] [place], [place] [address] [phone]

## 2022-06-07 ENCOUNTER — Ambulatory Visit (INDEPENDENT_AMBULATORY_CARE_PROVIDER_SITE_OTHER): Payer: Medicare HMO | Admitting: Family

## 2022-06-07 ENCOUNTER — Encounter: Payer: Self-pay | Admitting: Family

## 2022-06-07 VITALS — BP 178/82 | HR 56 | Temp 98.3°F | Resp 16 | Ht 64.57 in | Wt 108.0 lb

## 2022-06-07 DIAGNOSIS — Z1231 Encounter for screening mammogram for malignant neoplasm of breast: Secondary | ICD-10-CM

## 2022-06-07 DIAGNOSIS — Z130 Encounter for screening for diseases of the blood and blood-forming organs and certain disorders involving the immune mechanism: Secondary | ICD-10-CM

## 2022-06-07 DIAGNOSIS — Z0000 Encounter for general adult medical examination without abnormal findings: Secondary | ICD-10-CM

## 2022-06-07 DIAGNOSIS — I10 Essential (primary) hypertension: Secondary | ICD-10-CM

## 2022-06-07 DIAGNOSIS — I7121 Aneurysm of the ascending aorta, without rupture: Secondary | ICD-10-CM

## 2022-06-07 DIAGNOSIS — Z13820 Encounter for screening for osteoporosis: Secondary | ICD-10-CM

## 2022-06-07 DIAGNOSIS — E785 Hyperlipidemia, unspecified: Secondary | ICD-10-CM

## 2022-06-07 DIAGNOSIS — Z1159 Encounter for screening for other viral diseases: Secondary | ICD-10-CM

## 2022-06-07 DIAGNOSIS — Z13228 Encounter for screening for other metabolic disorders: Secondary | ICD-10-CM

## 2022-06-07 DIAGNOSIS — Z91148 Patient's other noncompliance with medication regimen for other reason: Secondary | ICD-10-CM

## 2022-06-07 DIAGNOSIS — E0590 Thyrotoxicosis, unspecified without thyrotoxic crisis or storm: Secondary | ICD-10-CM

## 2022-06-07 DIAGNOSIS — Z131 Encounter for screening for diabetes mellitus: Secondary | ICD-10-CM

## 2022-06-07 DIAGNOSIS — I700 Atherosclerosis of aorta: Secondary | ICD-10-CM | POA: Diagnosis not present

## 2022-06-07 DIAGNOSIS — Z0001 Encounter for general adult medical examination with abnormal findings: Secondary | ICD-10-CM | POA: Diagnosis not present

## 2022-06-07 DIAGNOSIS — Z8739 Personal history of other diseases of the musculoskeletal system and connective tissue: Secondary | ICD-10-CM | POA: Diagnosis not present

## 2022-06-07 MED ORDER — AMLODIPINE BESYLATE 10 MG PO TABS: 10.0000 mg | ORAL_TABLET | Freq: Every day | ORAL | 2 refills | Status: DC

## 2022-06-07 MED ORDER — HYDROCHLOROTHIAZIDE 50 MG PO TABS: 50.0000 mg | ORAL_TABLET | Freq: Every day | ORAL | 2 refills | Status: DC

## 2022-06-07 NOTE — Progress Notes (Signed)
Subjective:   Rebecca Estes is a 88 y.o. female who presents for Medicare Annual (Subsequent) preventive examination.  Review of Systems    Defer to PCP   Cardiac Risk Factors include: advanced age (>98mn, >>64women);hypertension     Objective:    Today's Vitals   06/07/22 1416 06/07/22 1446  BP: (!) 191/75 (!) 178/82  Pulse: (!) 50   Resp: 16   Temp: 98.3 F (36.8 C)   SpO2: 98%   Weight: 108 lb (49 kg)   Height: 5' 4.57" (1.64 m)   PainSc: 0-No pain    Body mass index is 18.21 kg/m.     11/02/2021    4:45 PM 10/03/2019    9:01 AM 03/06/2019   10:32 AM 02/15/2019   11:11 AM 02/11/2019    5:04 PM 12/27/2018    9:47 AM 10/10/2018    9:18 AM  Advanced Directives  Does Patient Have a Medical Advance Directive? No No No No No No No  Would patient like information on creating a medical advance directive?  Yes (MAU/Ambulatory/Procedural Areas - Information given) No - Patient declined No - Patient declined No - Patient declined No - Patient declined No - Patient declined    Current Medications (verified) Outpatient Encounter Medications as of 06/07/2022  Medication Sig   alendronate (FOSAMAX) 70 MG tablet Take 1 tablet (70 mg total) by mouth once a week. Take with a full glass of water on an empty stomach.   amLODipine (NORVASC) 10 MG tablet TAKE 1 TABLET (10 MG TOTAL) BY MOUTH DAILY.   aspirin EC 325 MG tablet Take 325 mg by mouth every morning.    Cholecalciferol (VITAMIN D3) 25 MCG (1000 UT) CAPS Vitamin D3 1,000 unit capsule  Take 1 capsule every day by oral route.   diclofenac Sodium (VOLTAREN) 1 % GEL Apply 2 g topically 4 (four) times daily.   hydrochlorothiazide (HYDRODIURIL) 50 MG tablet TAKE 1 TABLET (50 MG TOTAL) BY MOUTH DAILY. (Patient not taking: Reported on 05/09/2022)   memantine (NAMENDA) 10 MG tablet Take 1 tablet (10 mg total) by mouth 2 (two) times daily.   metoprolol succinate (TOPROL XL) 50 MG 24 hr tablet Take 1 tablet (50 mg total) by mouth daily.  Take with or immediately following a meal.   pravastatin (PRAVACHOL) 40 MG tablet Take 1 tablet (40 mg total) by mouth every evening.   predniSONE (DELTASONE) 50 MG tablet One tablet a day (Patient not taking: Reported on 05/09/2022)   tiZANidine (ZANAFLEX) 2 MG tablet Take 1 tablet (2 mg total) by mouth 2 (two) times daily as needed for muscle spasms. (Patient not taking: Reported on 05/09/2022)   No facility-administered encounter medications on file as of 06/07/2022.    Allergies (verified) Patient has no known allergies.   History: Past Medical History:  Diagnosis Date   Arrhythmia    Bleeding ulcer    [place]    Hiatal hernia    Hypertension    Hyperthyroidism    Osteoporosis    [place]oracic [place]   438mdiagnosed by echo 10/24/17.   Past Surgical History:  Procedure Laterality Date   ANTERIOR CERVICAL DECOMP/DISCECTOMY FUSION     x3   BREAST BIOPSY Left 02/10/2009   NECK SURGERY     ROTATOR CUFF REPAIR     Left   TUBAL LIGATION     UMBILICAL HERNIA REPAIR     Family History  Problem Relation Age of Onset  Aneurysm Father 60       Brain   Other Mother 55       Blood Infection   Coronary artery disease Brother 54   Stroke Brother 75   Aneurysm Sister 48       Brain   Cervical cancer Sister 70   Kidney failure Sister 71       was on Dialysis   Aneurysm Sister        Brain   Social History   Socioeconomic History   Marital status: Married    Spouse name: Not on file   Number of children: 5   Years of education: 9   Highest education level: Not on file  Occupational History   Not on file  Tobacco Use   Smoking status: Never    Passive exposure: Never   Smokeless tobacco: Never  Substance and Sexual Activity   Alcohol use: No   Drug use: No   Sexual activity: Not Currently    Partners: Male  Other Topics Concern   Not on file  Social History Narrative   Denies abuse and feels safe at home.    Social  Determinants of Health   Financial Resource Strain: Not on file  Food Insecurity: Not on file  Transportation Needs: Not on file  Physical Activity: Not on file  Stress: Not on file  Social Connections: Not on file    Tobacco Counseling Counseling given: Not Answered   Clinical Intake:  Pre-visit preparation completed: Yes  Pain : 0-10 Pain Score: 0-No pain     Diabetes: No  How often do you need to have someone help you when you read instructions, pamphlets, or other written materials from your doctor or pharmacy?: 1 - Never  Diabetic?No  Interpreter Needed?: No      Activities of Daily Living    06/07/2022    2:17 PM  In your present state of health, do you have any difficulty performing the following activities:  Hearing? 0  Vision? 0  Difficulty concentrating or making decisions? 0  Walking or climbing stairs? 0  Dressing or bathing? 0  Doing errands, shopping? 0  Preparing Food and eating ? N  Using the Toilet? N  In the past six months, have you accidently leaked urine? N  Do you have problems with loss of bowel control? N  Managing your Medications? N  Managing your Finances? N  Housekeeping or managing your Housekeeping? N    Patient Care Team: Camillia Herter, NP as PCP - General (Nurse Practitioner)  Indicate any recent Medical Services you may have received from other than [facility] providers in the past year (date may be approximate).     Assessment:   This is a routine wellness examination for [place].  Hearing/Vision screen No results found.  Dietary issues and exercise activities discussed: Current Exercise Habits: The patient does not participate in regular exercise at present, Exercise limited by: None identified   Goals Addressed   None   Depression Screen    06/07/2022    2:17 PM 11/16/2021    2:15 PM 08/04/2021   10:03 AM 05/03/2021   11:05 AM 04/09/2021    9:03 AM 02/25/2021   10:52 AM 11/06/2020    4:45 PM  PHQ 2/9 Scores   PHQ - 2 Score 0 0 0 0 0 0 0  PHQ- 9 Score     0 1 0    Fall Risk    06/07/2022  2:17 PM 04/09/2021    9:03 AM 11/02/2020    3:18 PM 10/03/2019    9:00 AM 03/06/2019   10:30 AM  Fall Risk   Falls in the past year? 1 0 0 0 1  Number falls in past yr: 1 0 0  1  Injury with Fall? 0 0 0  1  Risk for fall due to : Impaired balance/gait No Fall Risks   Impaired balance/gait;History of fall(s)  Follow up Falls evaluation completed Falls evaluation completed  Falls prevention discussed Education provided    [place]:  Any stairs in or around the home? Yes  If so, are there any without handrails? No  Home free of loose throw rugs in walkways, pet beds, electrical cords, etc? Yes  Adequate lighting in your home to reduce risk of falls? Yes   ASSISTIVE DEVICES UTILIZED TO PREVENT FALLS:  Life alert? No  Use of a cane, walker or w/c? No  Grab bars in the bathroom?  Shower chair or bench in shower?  Elevated toilet seat or a handicapped toilet?   TIMED UP AND GO:  Was the test performed? .  Length of time to ambulate 10 feet:  sec.     Cognitive Function:    06/07/2022    2:18 PM 05/09/2022   11:24 AM  MMSE - Mini Mental State Exam  Orientation to time 5 0  Orientation to Place 5 4  Registration 3 2  Attention/ Calculation 5 0  Recall 3 0  Language- name 2 objects 2 2  Language- repeat 1 0  Language- follow 3 step command 3 3  Language- read & follow direction 1 1  Write a sentence 1 1  Copy design 1 0  Total score 30 13        06/07/2022    2:53 PM  6CIT Screen  What Year? 0 points  What month? 0 points  What time? 0 points  Count back from 20 0 points  Months in reverse 0 points  Repeat phrase 0 points  Total Score 0 points    Immunizations Immunization History  Administered Date(s) Administered   Fluad Quad(high Dose 65+) 04/16/2020, 06/02/2022   Influenza, High Dose Seasonal PF 04/10/2018, 04/01/2019    Influenza-Unspecified 07/30/2019   PFIZER(Purple Top)SARS-COV-2 Vaccination 09/13/2019, 10/04/2019   Pneumococcal Polysaccharide-23 09/18/2017, 04/01/2019   Tdap 09/18/2017, 11/02/2021    TDAP status: Up to date  Flu Vaccine status: Up to date  Pneumococcal vaccine status: Up to date  Covid-19 vaccine status: Completed vaccines  Qualifies for Shingles Vaccine? Yes   Zostavax completed No   Shingrix Completed?: No.    Education has been provided regarding the importance of this vaccine. Patient has been advised to call insurance company to determine out of pocket expense if they have not yet received this vaccine. Advised may also receive vaccine at local pharmacy or Health Dept. Verbalized acceptance and understanding.  Screening Tests Health Maintenance  Topic Date Due   COVID-19 Vaccine (3 - Pfizer series) 06/23/2022 (Originally 11/29/2019)   Zoster Vaccines- Shingrix (1 of 2) 09/07/2022 (Originally 10[birth year]26/1985)   Pneumonia Vaccine 50+ Years old (2 - PCV) 06/08/2023 (Originally 03/31/2020)   Medicare Annual Wellness (AWV)  06/08/2023   TETANUS/TDAP  11/03/2031   INFLUENZA VACCINE  Completed   DEXA SCAN  Completed   HPV VACCINES  Aged Out    Health Maintenance  There are no preventive care reminders to display for this patient.  Bone Density status: Completed 05/26/2016. Results reflect: Bone density results: OSTEOPOROSIS. Repeat every 2 years.  Lung Cancer Screening: (Low Dose CT Chest recommended if Age 57-80 years, 30 pack-year currently smoking OR have quit w/in 15years.) does not qualify.   Lung Cancer Screening Referral:   Additional Screening:  Hepatitis C Screening: does qualify; Completed   Vision Screening: Recommended annual ophthalmology exams for early detection of glaucoma and other disorders of the eye. Is the patient up to date with their annual eye exam?  Yes  Who is the provider or what is the name of the office in which the patient attends  annual eye exams? [facility] If pt is not established with a provider, would they like to be referred to a provider to establish care?  N/A .   Dental Screening: Recommended annual dental exams for proper oral hygiene  Community Resource Referral / Chronic Care Management: CRR required this visit?  No   CCM required this visit?  No      Plan:     I have personally reviewed and noted the following in the patient's chart:   Medical and social history Use of alcohol, tobacco or illicit drugs  Current medications and supplements including opioid prescriptions. Patient is not currently taking opioid prescriptions. Functional ability and status Nutritional status Physical activity Advanced directives List of other physicians Hospitalizations, surgeries, and ER visits in previous 12 months Vitals Screenings to include cognitive, depression, and falls Referrals and appointments  In addition, I have reviewed and discussed with patient certain preventive protocols, quality metrics, and best practice recommendations. A written personalized care plan for preventive services as well as general preventive health recommendations were provided to patient.     Elmon Else, [facility]   06/07/2022

## 2022-06-07 NOTE — Progress Notes (Signed)
Subjective:   Rebecca Estes is a 88 y.o. female who presents for Medicare Annual (Subsequent) preventive examination.  Review of Systems    No issues/concerns for today. States "I don't take all of those medications" in regards to blood pressure medications. States she does take one blood pressure medication but isn't sure which one it is. Reports she was seen by a cardiologist but unsure of name. I do not see record of patient seeing a cardiologist in her chart. She denies red flag symptoms.   Cardiac Risk Factors include: advanced age (>41mn, >>17women);hypertension  Objective:    Today's Vitals   06/07/22 1416 06/07/22 1430 06/07/22 1446  BP: (!) 191/75  (!) 178/82  Pulse: (!) 50 (!) 56   Resp: 16    Temp: 98.3 F (36.8 C)    SpO2: 98%    Weight: 108 lb (49 kg)    Height: 5' 4.57" (1.64 m)    PainSc: 0-No pain     Body mass index is 18.21 kg/m.   Physical Exam HENT:     Head: Normocephalic and atraumatic.     Right Ear: Tympanic membrane, ear canal and external ear normal.     Left Ear: Tympanic membrane, ear canal and external ear normal.     Nose: Nose normal.     Mouth/Throat:     Mouth: Mucous membranes are moist.     Pharynx: Oropharynx is clear.  Eyes:     Extraocular Movements: Extraocular movements intact.     Conjunctiva/sclera: Conjunctivae normal.     Pupils: Pupils are equal, round, and reactive to light.  Cardiovascular:     Rate and Rhythm: Normal rate and regular rhythm.     Pulses: Normal pulses.     Heart sounds: Normal heart sounds.  Pulmonary:     Effort: Pulmonary effort is normal.     Breath sounds: Normal breath sounds.  Chest:     Comments: Patient declined.  Abdominal:     General: Bowel sounds are normal.     Palpations: Abdomen is soft.  Genitourinary:    Comments: Patient declined.  Musculoskeletal:        General: Normal range of motion.     Right shoulder: Normal.     Left shoulder: Normal.     Right upper arm: Normal.      Left upper arm: Normal.     Right elbow: Normal.     Left elbow: Normal.     Right forearm: Normal.     Left forearm: Normal.     Right wrist: Normal.     Left wrist: Normal.     Right hand: Normal.     Left hand: Normal.     Cervical back: Normal, normal range of motion and neck supple.     Thoracic back: Normal.     Lumbar back: Normal.     Right hip: Normal.     Left hip: Normal.     Right upper leg: Normal.     Left upper leg: Normal.     Right knee: Normal.     Left knee: Normal.     Right lower leg: Normal.     Left lower leg: Normal.     Right ankle: Normal.     Left ankle: Normal.     Right foot: Normal.     Left foot: Normal.  Skin:    General: Skin is warm and dry.     Capillary Refill: Capillary refill takes  less than 2 seconds.  Neurological:     General: No focal deficit present.     Mental Status: She is alert.  Psychiatric:        Mood and Affect: Mood normal.        Behavior: Behavior normal.         11/02/2021    4:45 PM 10/03/2019    9:01 AM 03/06/2019   10:32 AM 02/15/2019   11:11 AM 02/11/2019    5:04 PM 12/27/2018    9:47 AM 10/10/2018    9:18 AM  Advanced Directives  Does Patient Have a Medical Advance Directive? _0  No No  Would patient like information on creating a medical advance directive?  Yes (MAU/Ambulatory/Procedural Areas - Information given) No - Patient declined No - Patient declined No - Patient declined No - Patient declined No - Patient declined    Current Medications (verified) Outpatient Encounter Medications as of 06/07/2022  Medication Sig   alendronate (FOSAMAX) 70 MG tablet Take 1 tablet (70 mg total) by mouth once a week. Take with a full glass of water on an empty stomach.   amLODipine (NORVASC) 10 MG tablet TAKE 1 TABLET (10 MG TOTAL) BY MOUTH DAILY.   aspirin EC 325 MG tablet Take 325 mg by mouth every morning.    Cholecalciferol (VITAMIN D3) 25 MCG (1000 UT) CAPS Vitamin D3 1,000 unit capsule  Take 1  capsule every day by oral route.   diclofenac Sodium (VOLTAREN) 1 % GEL Apply 2 g topically 4 (four) times daily.   hydrochlorothiazide (HYDRODIURIL) 50 MG tablet TAKE 1 TABLET (50 MG TOTAL) BY MOUTH DAILY. (Patient not taking: Reported on 05/09/2022)   memantine (NAMENDA) 10 MG tablet Take 1 tablet (10 mg total) by mouth 2 (two) times daily.   metoprolol succinate (TOPROL XL) 50 MG 24 hr tablet Take 1 tablet (50 mg total) by mouth daily. Take with or immediately following a meal.   pravastatin (PRAVACHOL) 40 MG tablet Take 1 tablet (40 mg total) by mouth every evening.   predniSONE (DELTASONE) 50 MG tablet One tablet a day (Patient not taking: Reported on 05/09/2022)   tiZANidine (ZANAFLEX) 2 MG tablet Take 1 tablet (2 mg total) by mouth 2 (two) times daily as needed for muscle spasms. (Patient not taking: Reported on 05/09/2022)   No facility-administered encounter medications on file as of 06/07/2022.    Allergies (verified) Patient has no known allergies.   History: Past Medical History:  Diagnosis Date   Arrhythmia    Bleeding ulcer    Cancer ([place])    Hiatal hernia    Hypertension    Hyperthyroidism    Osteoporosis    Thoracic ascending aortic aneurysm ([place]) 11/06/2017   51m diagnosed by echo 10/24/17.   Past Surgical History:  Procedure Laterality Date   ANTERIOR CERVICAL DECOMP/DISCECTOMY FUSION     x3   BREAST BIOPSY Left 02/10/2009   NECK SURGERY     ROTATOR CUFF REPAIR     Left   TUBAL LIGATION     UMBILICAL HERNIA REPAIR     Family History  Problem Relation Age of Onset   Aneurysm Father 862      Brain   Other Mother 775      Blood Infection   Coronary artery disease Brother 641  Stroke Brother 667  Aneurysm Sister 455      Brain   Cervical cancer Sister 359  Kidney failure  Sister 25       was on Dialysis   Aneurysm Sister        Brain   Social History   Socioeconomic History   Marital status: Married    Spouse name: Not on file   Number of children:  5   Years of education: 9   Highest education level: Not on file  Occupational History   Not on file  Tobacco Use   Smoking status: Never    Passive exposure: Never   Smokeless tobacco: Never  Substance and Sexual Activity   Alcohol use: No   Drug use: No   Sexual activity: Not Currently    Partners: Male  Other Topics Concern   Not on file  Social History Narrative   Denies abuse and feels safe at home.    Social Determinants of Health   Financial Resource Strain: Not on file  Food Insecurity: Not on file  Transportation Needs: Not on file  Physical Activity: Not on file  Stress: Not on file  Social Connections: Not on file    Tobacco Counseling Patient reports she does not smoke currently or in the past.   Clinical Intake:  Pre-visit preparation completed: Yes  Pain : 0-10 Pain Score: 0-No pain  Diabetes: No  How often do you need to have someone help you when you read instructions, pamphlets, or other written materials from your doctor or pharmacy?: 1 - Never  Diabetic?No  Interpreter Needed?: No   Activities of Daily Living    06/07/2022    2:17 PM  In your present state of health, do you have any difficulty performing the following activities:  Hearing? 0  Vision? 0  Difficulty concentrating or making decisions? 0  Walking or climbing stairs? 0  Dressing or bathing? 0  Doing errands, shopping? 0  Preparing Food and eating ? N  Using the Toilet? N  In the past six months, have you accidently leaked urine? N  Do you have problems with loss of bowel control? N  Managing your Medications? N  Managing your Finances? N  Housekeeping or managing your Housekeeping? N   Patient Care Team: Camillia Herter, NP as General (Family Medicine)  Indicate any recent Medical Services you may have received from other than [facility] providers in the past year (date may be approximate).  Assessment:   This is a routine wellness examination for  [place].  Hearing/Vision screen Patient reports vision and hearing normal.   Dietary issues and exercise activities discussed: Current Exercise Habits: The patient does not participate in regular exercise at present, Exercise limited by: None identified  Goals Addressed  "Stay as healthy as expected for a 88 year-old."  Depression Screen    06/07/2022    2:17 PM 11/16/2021    2:15 PM 08/04/2021   10:03 AM 05/03/2021   11:05 AM 04/09/2021    9:03 AM 02/25/2021   10:52 AM 11/06/2020    4:45 PM  PHQ 2/9 Scores  PHQ - 2 Score 0 0 0 0 0 0 0  PHQ- 9 Score     0 1 0    Fall Risk    06/07/2022    2:17 PM 04/09/2021    9:03 AM 11/02/2020    3:18 PM 10/03/2019    9:00 AM 03/06/2019   10:30 AM  Fall Risk   Falls in the past year? 1 0 0 0 1  Number falls in past yr: 1 0 0  1  Injury  with Fall? 0 0 0  1  Risk for fall due to : Impaired balance/gait No Fall Risks   Impaired balance/gait;History of fall(s)  Follow up Falls evaluation completed Falls evaluation completed  Falls prevention discussed Education provided    [place]: Any stairs in or around the home? Yes  If so, are there any without handrails? No  Home free of loose throw rugs in walkways, pet beds, electrical cords, etc? Yes  Adequate lighting in your home to reduce risk of falls? Yes   ASSISTIVE DEVICES UTILIZED TO PREVENT FALLS: Life alert? No  Use of a cane, walker or w/c? No  Grab bars in the bathroom? Yes  Shower chair or bench in shower?  Has one but doesn't use it  Elevated toilet seat or a handicapped toilet? Yes   TIMED UP AND GO: Was the test performed? Yes .  Length of time to ambulate 10 feet: 8 sec.   Gait slow and steady without use of assistive device  Cognitive Function:    06/07/2022    2:18 PM 05/09/2022   11:24 AM  MMSE - Mini Mental State Exam  Orientation to time 5 0  Orientation to Place 5 4  Registration 3 2  Attention/ Calculation 5 0  Recall 3 0   Language- name 2 objects 2 2  Language- repeat 1 0  Language- follow 3 step command 3 3  Language- read & follow direction 1 1  Write a sentence 1 1  Copy design 1 0  Total score 30 13        06/07/2022    2:53 PM  6CIT Screen  What Year? 0 points  What month? 0 points  What time? 0 points  Count back from 20 0 points  Months in reverse 0 points  Repeat phrase 0 points  Total Score 0 points    Immunizations Immunization History  Administered Date(s) Administered   Fluad Quad(high Dose 65+) 04/16/2020, 06/02/2022   Influenza, High Dose Seasonal PF 04/10/2018, 04/01/2019   Influenza-Unspecified 07/30/2019   PFIZER(Purple Top)SARS-COV-2 Vaccination 09/13/2019, 10/04/2019   Pneumococcal Polysaccharide-23 09/18/2017, 04/01/2019   Tdap 09/18/2017, 11/02/2021    TDAP status: Up to date  Flu Vaccine status: Up to date  Pneumococcal vaccine status: Up to date  Covid-19 vaccine status: Completed vaccines  Qualifies for Shingles Vaccine? Yes   Zostavax completed No   Shingrix Completed?: No.    Education has been provided regarding the importance of this vaccine. Patient has been advised to call insurance company to determine out of pocket expense if they have not yet received this vaccine. Advised may also receive vaccine at local pharmacy or Health Dept. Verbalized acceptance and understanding.  Screening Tests Health Maintenance  Topic Date Due   COVID-19 Vaccine (3 - Pfizer series) 06/23/2022 (Originally 11/29/2019)   Zoster Vaccines- Shingrix (1 of 2) 09/07/2022 (Originally 10/26/1985)   Pneumonia Vaccine 10+ Years old (2 - PCV) 06/08/2023 (Originally 03/31/2020)   Medicare Annual Wellness (AWV)  06/08/2023   TETANUS/TDAP  11/03/2031   INFLUENZA VACCINE  Completed   DEXA SCAN  Completed   HPV VACCINES  Aged Out    Health Maintenance  There are no preventive care reminders to display for this patient. Discussed with Dorna Mai, MD preventive screenings  recommended for patient.  Colorectal cancer screening: No longer required.   Mammogram status: No longer required due to age.  Bone Density Status: Ordered 06/07/2022.  Lung Cancer Screening: (Low  Dose CT Chest recommended if Age 33-80 years, 30 pack-year currently smoking OR have quit w/in 15years.) does not qualify.   Lung Cancer Screening Referral: n/a  Additional Screening: Hepatitis C Screening: does qualify; Completed today  Vision Screening: Recommended annual ophthalmology exams for early detection of glaucoma and other disorders of the eye. Is the patient up to date with their annual eye exam?  Yes  Who is the provider or what is the name of the office in which the patient attends annual eye exams? [facility] If pt is not established with a provider, would they like to be referred to a provider to establish care?  N/A .   Dental Screening: Recommended annual dental exams for proper oral hygiene  Community Resource Referral / Chronic Care Management: CRR required this visit?  No   CCM required this visit?  No    Plan:  1. Medicare annual wellness visit, subsequent - Counseled on 150 minutes of exercise per week as tolerated, healthy eating (including decreased daily intake of saturated fats, cholesterol, added sugars, sodium), STI prevention, and routine healthcare maintenance.  2. Uncontrolled hypertension 3. Nonadherence to medication 4. Aortic atherosclerosis ([place]) 5. Ascending aortic aneurysm, unspecified whether ruptured ([place]) - Blood pressure not at goal during today's visit. Patient asymptomatic without chest pressure, chest pain, palpitations, shortness of breath, worst headache of life, and any additional red flag symptoms. - Patient reports she does not take blood pressure medications as prescribed due to too many medications.  - Discussed with patient the importance of taking blood pressure medications as prescribed. Amlodipine and Hydrochlorothiazide  refills ordered. - Counseled on blood pressure goal of less than 140/90, low-sodium, DASH diet, medication compliance, 150 minutes of moderate intensity exercise per week as tolerated. Discussed medication compliance, adverse effects. - Referral to Cardiology for further evaluation and management.  - Ambulatory referral to Cardiology - hydrochlorothiazide (HYDRODIURIL) 50 MG tablet; Take 1 tablet (50 mg total) by mouth daily.  Dispense: 30 tablet; Refill: 2 - amLODipine (NORVASC) 10 MG tablet; Take 1 tablet (10 mg total) by mouth daily.  Dispense: 30 tablet; Refill: 2  6. Hyperlipidemia, unspecified hyperlipidemia type - Update lipid panel.  - Referral to Cardiology for further evaluation and management.  - Lipid panel - Ambulatory referral to Cardiology  7. Subclinical hyperthyroidism - Routine screening.  - TSH  8. Osteoporosis screening 9. History of osteoporosis - DG Bone Density for evaluation.  - DG Bone Density; Future  10. Screening for metabolic disorder - Routine screening.  - CMP14+EGFR  11. Screening for deficiency anemia - Routine screening.  - CBC  12. Diabetes mellitus screening - Routine screening.  - Hemoglobin A1c  13. Need for hepatitis C screening test - Routine screening.  - Hepatitis C Antibody   I have personally reviewed and noted the following in the patient's chart:   Medical and social history Use of alcohol, tobacco or illicit drugs  Current medications and supplements including opioid prescriptions. Patient is not currently taking opioid prescriptions. Functional ability and status Nutritional status Physical activity Advanced directives List of other physicians Hospitalizations, surgeries, and ER visits in previous 12 months Vitals Screenings to include cognitive, depression, and falls Referrals and appointments  In addition, I have reviewed and discussed with patient certain preventive protocols, quality metrics, and best practice  recommendations. A written personalized care plan for preventive services as well as general preventive health recommendations were provided to patient.     Camillia Herter, NP  06/07/2022

## 2022-06-08 ENCOUNTER — Other Ambulatory Visit: Payer: Self-pay | Admitting: Family

## 2022-06-08 DIAGNOSIS — E0590 Thyrotoxicosis, unspecified without thyrotoxic crisis or storm: Secondary | ICD-10-CM

## 2022-06-08 DIAGNOSIS — E785 Hyperlipidemia, unspecified: Secondary | ICD-10-CM

## 2022-06-08 LAB — CBC
Hematocrit: 32.7 % — ABNORMAL LOW (ref 34.0–46.6)
Hemoglobin: 10.9 g/dL — ABNORMAL LOW (ref 11.1–15.9)
MCH: 30 pg (ref 26.6–33.0)
MCHC: 33.3 g/dL (ref 31.5–35.7)
MCV: 90 fL (ref 79–97)
Platelets: 265 10*3/uL (ref 150–450)
RBC: 3.63 x10E6/uL — ABNORMAL LOW (ref 3.77–5.28)
RDW: 12.6 % (ref 11.7–15.4)
WBC: 4.3 10*3/uL (ref 3.4–10.8)

## 2022-06-08 LAB — CMP14+EGFR
ALT: 12 IU/L (ref 0–32)
AST: 16 IU/L (ref 0–40)
Albumin/Globulin Ratio: 2 (ref 1.2–2.2)
Albumin: 4.5 g/dL (ref 3.7–4.7)
Alkaline Phosphatase: 85 IU/L (ref 44–121)
BUN/Creatinine Ratio: 24 (ref 12–28)
BUN: 18 mg/dL (ref 8–27)
Bilirubin Total: <0.2 mg/dL (ref 0.0–1.2)
CO2: 23 mmol/L (ref 20–29)
Calcium: 10.7 mg/dL — ABNORMAL HIGH (ref 8.7–10.3)
Chloride: 104 mmol/L (ref 96–106)
Creatinine, Ser: 0.74 mg/dL (ref 0.57–1.00)
Globulin, Total: 2.2 g/dL (ref 1.5–4.5)
Glucose: 84 mg/dL (ref 70–99)
Potassium: 4 mmol/L (ref 3.5–5.2)
Sodium: 143 mmol/L (ref 134–144)
Total Protein: 6.7 g/dL (ref 6.0–8.5)
eGFR: 79 mL/min/{1.73_m2} (ref >59)

## 2022-06-08 LAB — LIPID PANEL
Chol/HDL Ratio: 2.3 ratio (ref 0.0–4.4)
Cholesterol, Total: 206 mg/dL — ABNORMAL HIGH (ref 100–199)
HDL: 88 mg/dL (ref >39)
LDL Chol Calc (NIH): 104 mg/dL — ABNORMAL HIGH (ref 0–99)
Triglycerides: 80 mg/dL (ref 0–149)
VLDL Cholesterol Cal: 14 mg/dL (ref 5–40)

## 2022-06-08 LAB — HEMOGLOBIN A1C
Est. average glucose Bld gHb Est-mCnc: 120 mg/dL
Hgb A1c MFr Bld: 5.8 % — ABNORMAL HIGH (ref 4.8–5.6)

## 2022-06-08 LAB — HEPATITIS C ANTIBODY: Hep C Virus Ab: NONREACTIVE

## 2022-06-08 LAB — TSH: TSH: 0.113 u[IU]/mL — ABNORMAL LOW (ref 0.450–4.500)

## 2022-06-08 MED ORDER — PRAVASTATIN SODIUM 40 MG PO TABS: 40.0000 mg | ORAL_TABLET | Freq: Every evening | ORAL | 2 refills | Status: DC

## 2022-07-15 ENCOUNTER — Ambulatory Visit
Admission: EM | Admit: 2022-07-15 | Discharge: 2022-07-15 | Disposition: A | Discharge status: Home / Self Care | Payer: Medicare HMO

## 2022-07-15 DIAGNOSIS — I10 Essential (primary) hypertension: Secondary | ICD-10-CM

## 2022-07-15 NOTE — ED Provider Notes (Signed)
[facility]    CSN: [id number] Arrival date & time: 07/15/22  1331      History   Chief Complaint Chief Complaint  Patient presents with   Hypertension    HPI Rebecca Estes is a 88 y.o. female.   Patient here today for evaluation of elevated blood pressure that they noticed in [employer] earlier today.  She is here today with her spouse who reports that blood pressure was 184/82 in [employer].  He states he was told by a provider in the past that if his systolic blood pressure over 1/150 that he should be evaluated in the emergency department.  Patient has not had any nausea, vomiting, headache, numbness, tingling, chest pain or shortness of breath.  Chart review reveals similar blood pressure to that we have in office today on 06/07/2022.  The history is provided by the patient and the spouse.  Hypertension Pertinent negatives include no chest pain, no headaches and no shortness of breath.    Past Medical History:  Diagnosis Date   Arrhythmia    Bleeding ulcer    Cancer ([place])    Hiatal hernia    Hypertension    Hyperthyroidism    Osteoporosis    Thoracic ascending aortic aneurysm ([place]) 11/06/2017   63m diagnosed by echo 10/24/17.    Patient Active Problem List   Diagnosis Date Noted   Pain in left knee 12/01/2021   Ceruminosis, right 10/04/2019   Back muscle spasm 02/15/2019   Muscle cramp, nocturnal 12/27/2018   Cough 10/12/2018   Hernia of abdominal wall 10/12/2018   Aortic atherosclerosis ([place] 07/24/2018   Chronic pain of both shoulders 07/24/2018   BMI less than 19,adult 02/01/2018   Difficulty chewing due to dentures 02/01/2018   Thoracic ascending aortic aneurysm ([place] 11/06/2017   Palpitations 10/05/2017   Seasonal allergies 09/18/2017   Essential hypertension 05/26/2016   Osteoporosis 05/26/2016   Subclinical hyperthyroidism 05/26/2016    Past Surgical History:  Procedure Laterality Date   ANTERIOR CERVICAL DECOMP/DISCECTOMY FUSION      x3   BREAST BIOPSY Left 02/10/2009   NECK SURGERY     ROTATOR CUFF REPAIR     Left   TUBAL LIGATION     UMBILICAL HERNIA REPAIR      OB History   No obstetric history on file.      Home Medications    Prior to Admission medications   Medication Sig Start Date End Date Taking? Authorizing Provider  alendronate (FOSAMAX) 70 MG tablet Take 1 tablet (70 mg total) by mouth once a week. Take with a full glass of water on an empty stomach. 10/08/19   HLucious Groves DO  amLODipine (NORVASC) 10 MG tablet Take 1 tablet (10 mg total) by mouth daily. 06/07/22 09/05/22  SCamillia Herter NP  aspirin EC 325 MG tablet Take 325 mg by mouth every morning.     [provider]  Cholecalciferol (VITAMIN D3) 25 MCG (1000 UT) CAPS Vitamin D3 1,000 unit capsule  Take 1 capsule every day by oral route. 03/06/19   AHarvie Heck MD  diclofenac Sodium (VOLTAREN) 1 % GEL Apply 2 g topically 4 (four) times daily. 10/20/20   Wieters, Hallie C, PA-C  hydrochlorothiazide (HYDRODIURIL) 50 MG tablet Take 1 tablet (50 mg total) by mouth daily. 06/07/22 09/05/22  SCamillia Herter NP  memantine (NAMENDA) 10 MG tablet Take 1 tablet (10 mg total) by mouth 2 (two) times daily. 05/09/22   Penumalli, VEarlean Polka MD  metoprolol succinate (TOPROL XL) 50 MG 24 hr tablet Take 1 tablet (50 mg total) by mouth daily. Take with or immediately following a meal. 10/08/19 10/07/20  Lucious Groves, DO  pravastatin (PRAVACHOL) 40 MG tablet Take 1 tablet (40 mg total) by mouth every evening. 06/08/22 09/06/22  Camillia Herter, NP  predniSONE (DELTASONE) 50 MG tablet One tablet a day Patient not taking: Reported on 05/09/2022 11/14/21   Fransico Meadow, PA-C  tiZANidine (ZANAFLEX) 2 MG tablet Take 1 tablet (2 mg total) by mouth 2 (two) times daily as needed for muscle spasms. Patient not taking: Reported on 05/09/2022 03/23/20   Arturo Morton    Family History Family History  Problem Relation Age of Onset   Aneurysm Father 96       Brain    Other Mother 46       Blood Infection   Coronary artery disease Brother 39   Stroke Brother 53   Aneurysm Sister 10       Brain   Cervical cancer Sister 63   Kidney failure Sister 9       was on Dialysis   Aneurysm Sister        Brain    Social History Social History   Tobacco Use   Smoking status: Never    Passive exposure: Never   Smokeless tobacco: Never  Substance Use Topics   Alcohol use: No   Drug use: No     Allergies   Patient has no known allergies.   Review of Systems Review of Systems  Constitutional:  Negative for chills and fever.  Eyes:  Negative for discharge and redness.  Respiratory:  Negative for shortness of breath.   Cardiovascular:  Negative for chest pain.  Gastrointestinal:  Negative for nausea and vomiting.  Neurological:  Negative for numbness and headaches.     Physical Exam Triage Vital Signs ED Triage Vitals  Enc Vitals Group     BP 07/15/22 1348 (!) 175/94     Pulse Rate 07/15/22 1348 65     Resp 07/15/22 1348 17     Temp 07/15/22 1348 98 F (36.7 C)     Temp Source 07/15/22 1348 Oral     SpO2 07/15/22 1348 99 %     Weight --      Height --      Head Circumference --      Peak Flow --      Pain Score 07/15/22 1347 0     Pain Loc --      Pain Edu? --      Excl. in [place]? --    No data found.  Updated Vital Signs BP (!) 175/94 (BP Location: Left Arm)   Pulse 65   Temp 98 F (36.7 C) (Oral)   Resp 17   SpO2 99%   Physical Exam Vitals and nursing note reviewed.  Constitutional:      General: She is not in acute distress.    Appearance: Normal appearance. She is not ill-appearing.  HENT:     Head: Normocephalic and atraumatic.  Eyes:     Conjunctiva/sclera: Conjunctivae normal.  Cardiovascular:     Rate and Rhythm: Normal rate and regular rhythm.  Pulmonary:     Effort: Pulmonary effort is normal. No respiratory distress.     Breath sounds: Normal breath sounds. No wheezing, rhonchi or rales.   Musculoskeletal:     Right lower leg: No edema.  Left lower leg: No edema.  Neurological:     Mental Status: She is alert.  Psychiatric:        Mood and Affect: Mood normal.        Behavior: Behavior normal.        Thought Content: Thought content normal.      UC Treatments / Results  Labs (all labs ordered are listed, but only abnormal results are displayed) Labs Reviewed - No data to display  EKG   Radiology No results found.  Procedures Procedures (including critical care time)  Medications Ordered in UC Medications - No data to display  Initial Impression / Assessment and Plan / UC Course  I have reviewed the triage vital signs and the nursing notes.  Pertinent labs & imaging results that were available during my care of the patient were reviewed by me and considered in my medical decision making (see chart for details).    Patient reports taking 2 of the 3 reported blood pressure medications noted in her chart.  There is a question if she is taking medications correctly.  I strongly recommended discussion with her primary care provider regarding correct medications and current dosing.  Patient has no signs or symptoms of hypertensive emergency in office today.  I recommended further evaluation in the emergency room should she have any development of chest pain, shortness of breath, headache or other concerning symptoms.  Patient and husband expressed understanding.  Final Clinical Impressions(s) / UC Diagnoses   Final diagnoses:  Essential hypertension     Discharge Instructions       Please call Primary care to verify correct medications for high blood pressure.      ED Prescriptions   None    PDMP not reviewed this encounter.   Francene Finders, PA-C 07/15/22 [phone]

## 2022-07-15 NOTE — Discharge Instructions (Signed)
  Please call Primary care to verify correct medications for high blood pressure.

## 2022-07-15 NOTE — ED Triage Notes (Signed)
Pt presents with elevated blood pressure today while shopping in [employer]; pt states she took her medicine.

## 2022-08-24 ENCOUNTER — Other Ambulatory Visit: Payer: Self-pay | Admitting: Family

## 2022-08-24 DIAGNOSIS — I10 Essential (primary) hypertension: Secondary | ICD-10-CM

## 2022-08-24 NOTE — Telephone Encounter (Signed)
Requested Prescriptions  Pending Prescriptions Disp Refills   amLODipine (NORVASC) 10 MG tablet [Pharmacy Med Name: AMLODIPINE BESYLATE 10 MG Tablet] 90 tablet 3    Sig: TAKE 1 TABLET EVERY DAY     Cardiovascular: Calcium Channel Blockers 2 Failed - 08/24/2022 10:28 AM      Failed - Last BP in normal range    BP Readings from Last 1 Encounters:  07/15/22 (!) 175/94         Passed - Last Heart Rate in normal range    Pulse Readings from Last 1 Encounters:  07/15/22 65         Passed - Valid encounter within last 6 months    Recent Outpatient Visits           2 months ago Medicare annual wellness visit, subsequent   Primary Care at [facility], Amy J, NP   5 months ago Swelling of joint of left knee   Primary Care at [facility], Amy J, NP   9 months ago Visit for suture removal   Primary Care at [facility], Petros, NP   1 year ago Systolic hypertension   Primary Care at [facility], Amy J, NP   1 year ago Essential hypertension   Primary Care at [facility], Amy J, NP               hydrochlorothiazide (HYDRODIURIL) 50 MG tablet [Pharmacy Med Name: HYDROCHLOROTHIAZIDE 50 MG Tablet] 90 tablet 3    Sig: TAKE 1 TABLET EVERY DAY     Cardiovascular: Diuretics - Thiazide Failed - 08/24/2022 10:28 AM      Failed - Last BP in normal range    BP Readings from Last 1 Encounters:  07/15/22 (!) 175/94         Passed - Cr in normal range and within 180 days    Creatinine, Ser  Date Value Ref Range Status  06/07/2022 0.74 0.57 - 1.00 mg/dL Final         Passed - K in normal range and within 180 days    Potassium  Date Value Ref Range Status  06/07/2022 4.0 3.5 - 5.2 mmol/L Final         Passed - Na in normal range and within 180 days    Sodium  Date Value Ref Range Status  06/07/2022 143 134 - 144 mmol/L Final         Passed - Valid encounter within last 6 months    Recent Outpatient Visits           2  months ago Medicare annual wellness visit, subsequent   Primary Care at [facility], Amy J, NP   5 months ago Swelling of joint of left knee   Primary Care at [facility], Amy J, NP   9 months ago Visit for suture removal   Primary Care at [facility], Amy J, NP   1 year ago Systolic hypertension   Primary Care at [facility], Amy J, NP   1 year ago Essential hypertension   Primary Care at [facility], Flonnie Hailstone, NP

## 2022-09-19 ENCOUNTER — Encounter: Payer: Self-pay | Admitting: Family

## 2022-09-19 ENCOUNTER — Ambulatory Visit (INDEPENDENT_AMBULATORY_CARE_PROVIDER_SITE_OTHER): Payer: Medicare HMO | Admitting: Family

## 2022-09-19 VITALS — BP 171/77 | HR 57 | Temp 98.3°F | Resp 16 | Ht 64.57 in | Wt 110.0 lb

## 2022-09-19 DIAGNOSIS — I700 Atherosclerosis of aorta: Secondary | ICD-10-CM | POA: Diagnosis not present

## 2022-09-19 DIAGNOSIS — I10 Essential (primary) hypertension: Secondary | ICD-10-CM | POA: Diagnosis not present

## 2022-09-19 DIAGNOSIS — E0590 Thyrotoxicosis, unspecified without thyrotoxic crisis or storm: Secondary | ICD-10-CM

## 2022-09-19 DIAGNOSIS — J3489 Other specified disorders of nose and nasal sinuses: Secondary | ICD-10-CM | POA: Diagnosis not present

## 2022-09-19 DIAGNOSIS — Z91148 Patient's other noncompliance with medication regimen for other reason: Secondary | ICD-10-CM | POA: Diagnosis not present

## 2022-09-19 DIAGNOSIS — E785 Hyperlipidemia, unspecified: Secondary | ICD-10-CM | POA: Diagnosis not present

## 2022-09-19 DIAGNOSIS — Z5329 Procedure and treatment not carried out because of patient's decision for other reasons: Secondary | ICD-10-CM

## 2022-09-19 MED ORDER — PREDNISONE 10 MG PO TABS: 20.0000 mg | ORAL_TABLET | Freq: Every day | ORAL | 0 refills | Status: AC

## 2022-09-19 MED ORDER — PRAVASTATIN SODIUM 40 MG PO TABS: 40.0000 mg | ORAL_TABLET | Freq: Every evening | ORAL | 2 refills | Status: DC

## 2022-09-19 NOTE — Progress Notes (Signed)
Patient ID: Rebecca Estes, female    DOB: 11-Sep-1933  MRN: [id number]  CC: Sinus Problem   Subjective: Rebecca Estes is a 89 y.o. female who presents for sinus problem. She is accompanied by her husband.  Her concerns today include:  Patient reports sinus congestion and productive cough x 1 week. She denies red flag symptoms. She has tried over-the-counter medications with no relief. Since last visit states that she is not taking her blood pressure medication as prescribed. States "I forget to take it". Husband reports he places her blood pressure medications in a pill box to try to help patient remember to take it but she doesn't. Also, she has not established with Cardiology. I discussed with patient that she should report to the emergency department by EMS on today for evaluation of uncontrolled blood pressure. Patient declined and stated "I feel fine". She denies red flag symptoms associated with high blood pressure.  Patient Active Problem List   Diagnosis Date Noted   Pain in left knee 12/01/2021   Ceruminosis, right 10/04/2019   Back muscle spasm 02/15/2019   Muscle cramp, nocturnal 12/27/2018   Cough 10/12/2018   Hernia of abdominal wall 10/12/2018   Aortic atherosclerosis ([place]) 07/24/2018   Chronic pain of both shoulders 07/24/2018   BMI less than 19,adult 02/01/2018   Difficulty chewing due to dentures 02/01/2018   Thoracic ascending aortic aneurysm ([place]) 11/06/2017   Palpitations 10/05/2017   Seasonal allergies 09/18/2017   Essential hypertension 05/26/2016   Osteoporosis 05/26/2016   Subclinical hyperthyroidism 05/26/2016     Current Outpatient Medications on File Prior to Visit  Medication Sig Dispense Refill   alendronate (FOSAMAX) 70 MG tablet Take 1 tablet (70 mg total) by mouth once a week. Take with a full glass of water on an empty stomach. 12 tablet 3   amLODipine (NORVASC) 10 MG tablet TAKE 1 TABLET EVERY DAY 90 tablet 1   aspirin EC 325 MG  tablet Take 325 mg by mouth every morning.      Cholecalciferol (VITAMIN D3) 25 MCG (1000 UT) CAPS Vitamin D3 1,000 unit capsule  Take 1 capsule every day by oral route. 60 capsule 2   diclofenac Sodium (VOLTAREN) 1 % GEL Apply 2 g topically 4 (four) times daily. 150 g 0   hydrochlorothiazide (HYDRODIURIL) 50 MG tablet TAKE 1 TABLET EVERY DAY 90 tablet 1   memantine (NAMENDA) 10 MG tablet Take 1 tablet (10 mg total) by mouth 2 (two) times daily. 60 tablet 12   metoprolol succinate (TOPROL XL) 50 MG 24 hr tablet Take 1 tablet (50 mg total) by mouth daily. Take with or immediately following a meal. 90 tablet 3   tiZANidine (ZANAFLEX) 2 MG tablet Take 1 tablet (2 mg total) by mouth 2 (two) times daily as needed for muscle spasms. (Patient not taking: Reported on 05/09/2022) 10 tablet 0   No current facility-administered medications on file prior to visit.    No Known Allergies  Social History   Socioeconomic History   Marital status: Married    Spouse name: Not on file   Number of children: 5   Years of education: 9   Highest education level: Not on file  Occupational History   Not on file  Tobacco Use   Smoking status: Never    Passive exposure: Never   Smokeless tobacco: Never  Substance and Sexual Activity   Alcohol use: No   Drug use: No   Sexual activity: Not Currently  Partners: Male  Other Topics Concern   Not on file  Social History Narrative   Denies abuse and feels safe at home.    Social Determinants of Health   Financial Resource Strain: Not on file  Food Insecurity: Not on file  Transportation Needs: Not on file  Physical Activity: Not on file  Stress: Not on file  Social Connections: Not on file  Intimate Partner Violence: Not on file    Family History  Problem Relation Age of Onset   Aneurysm Father 22       Brain   Other Mother 71       Blood Infection   Coronary artery disease Brother 39   Stroke Brother 80   Aneurysm Sister 16       Brain    Cervical cancer Sister 67   Kidney failure Sister 54       was on Dialysis   Aneurysm Sister        Brain    Past Surgical History:  Procedure Laterality Date   ANTERIOR CERVICAL DECOMP/DISCECTOMY FUSION     x3   BREAST BIOPSY Left 02/10/2009   NECK SURGERY     ROTATOR CUFF REPAIR     Left   TUBAL LIGATION     UMBILICAL HERNIA REPAIR      ROS: Review of Systems Negative except as stated above  PHYSICAL EXAM: BP (!) 171/77   Pulse (!) 57   Temp 98.3 F (36.8 C)   Resp 16   Ht 5' 4.57" (1.64 m)   Wt 110 lb (49.9 kg)   SpO2 95%   BMI 18.55 kg/m   Physical Exam HENT:     Head: Normocephalic and atraumatic.     Right Ear: Tympanic membrane, ear canal and external ear normal.     Left Ear: Tympanic membrane, ear canal and external ear normal.     Nose: Nose normal.     Mouth/Throat:     Mouth: Mucous membranes are moist.     Pharynx: Oropharynx is clear.  Eyes:     Extraocular Movements: Extraocular movements intact.     Conjunctiva/sclera: Conjunctivae normal.     Pupils: Pupils are equal, round, and reactive to light.  Cardiovascular:     Rate and Rhythm: Normal rate and regular rhythm.     Pulses: Normal pulses.     Heart sounds: Normal heart sounds.  Pulmonary:     Effort: Pulmonary effort is normal.     Breath sounds: Normal breath sounds.  Musculoskeletal:     Cervical back: Normal range of motion and neck supple.  Neurological:     General: No focal deficit present.     Mental Status: She is alert and oriented to person, place, and time.  Psychiatric:        Mood and Affect: Mood normal.        Behavior: Behavior normal.     ASSESSMENT AND PLAN: 1. Sinus pressure - Patient today in office with no cardiopulmonary distress.  - Routine screening.  - Prednisone as prescribed.  - Follow-up with primary provider as scheduled.  - COVID-19, Flu A+B and RSV - predniSONE (DELTASONE) 10 MG tablet; Take 2 tablets (20 mg total) by mouth daily with  breakfast for 5 days.  Dispense: 10 tablet; Refill: 0  2. Uncontrolled hypertension 3. Left against medical advice 4. Nonadherence to medication 5. Aortic atherosclerosis ([place]) 6. Hyperlipidemia, unspecified hyperlipidemia type - Patient left office against medical advice. Patient signed  Against Medical Advice document prior to leaving office.  - Continue Amlodipine and Hydrochlorothiazide as prescribed. No refills needed as of present.  - Continue Pravastatin as prescribed.  - Counseled on blood pressure goal of less than 140/90, low-sodium, DASH diet, medication compliance, 150 minutes of moderate intensity exercise per week as tolerated. Discussed medication compliance, adverse effects.  - Referral to Cardiology for further evaluation/management.  - Ambulatory referral to Cardiology - pravastatin (PRAVACHOL) 40 MG tablet; Take 1 tablet (40 mg total) by mouth every evening.  Dispense: 30 tablet; Refill: 2  7. Hyperthyroidism - Referral to Endocrinology for further evaluation/management.  - Ambulatory referral to Endocrinology   Patient was given the opportunity to ask questions.  Patient verbalized understanding of the plan and was able to repeat key elements of the plan. Patient was given clear instructions to go to Emergency Department or return to medical center if symptoms don't improve, worsen, or new problems develop.The patient verbalized understanding.   Orders Placed This Encounter  Procedures   COVID-19, Flu A+B and RSV   Ambulatory referral to Cardiology   Ambulatory referral to Endocrinology    Requested Prescriptions   Signed Prescriptions Disp Refills   predniSONE (DELTASONE) 10 MG tablet 10 tablet 0    Sig: Take 2 tablets (20 mg total) by mouth daily with breakfast for 5 days.   pravastatin (PRAVACHOL) 40 MG tablet 30 tablet 2    Sig: Take 1 tablet (40 mg total) by mouth every evening.    Follow-up with primary provider as scheduled.   Camillia Herter, NP

## 2022-09-19 NOTE — Progress Notes (Signed)
Pt presents for sinus congestion  -states been going on since last week

## 2022-09-20 LAB — COVID-19, FLU A+B AND RSV
Influenza A, NAA: NOT DETECTED
Influenza B, NAA: NOT DETECTED
RSV, NAA: NOT DETECTED
SARS-CoV-2, NAA: NOT DETECTED

## 2022-10-21 ENCOUNTER — Ambulatory Visit (INDEPENDENT_AMBULATORY_CARE_PROVIDER_SITE_OTHER): Payer: Medicare HMO

## 2022-10-21 ENCOUNTER — Ambulatory Visit
Admission: EM | Admit: 2022-10-21 | Discharge: 2022-10-21 | Disposition: A | Discharge status: Home / Self Care | Payer: Medicare HMO | Attending: Family Medicine | Admitting: Family Medicine

## 2022-10-21 ENCOUNTER — Encounter: Payer: Self-pay | Admitting: Emergency Medicine

## 2022-10-21 DIAGNOSIS — R079 Chest pain, unspecified: Secondary | ICD-10-CM | POA: Diagnosis not present

## 2022-10-21 DIAGNOSIS — R0789 Other chest pain: Secondary | ICD-10-CM

## 2022-10-21 MED ORDER — PREDNISONE 20 MG PO TABS: 40.0000 mg | ORAL_TABLET | Freq: Every day | ORAL | 0 refills | Status: AC

## 2022-10-21 NOTE — Discharge Instructions (Signed)
EKG did not show any new problems.  The chest x-ray did not show any abnormality except for scoliosis that has been there.   You can take Tylenol as needed for pain and you can also use heating pad or heat patch to the sore area.  Take prednisone 20 mg--2 daily for 3 days

## 2022-10-21 NOTE — ED Triage Notes (Signed)
Reports a right sided chest pain that began around 6 days ago that started gradually and has increased since starting. Reports that since it's started it's made her feel more fatigued than usual. States that moving her arm makes the pain worse, and husband believes there's a lump in the general area on her anterior chest wall. Massage to the area doesn't change the pain. Denies nausea, vomiting, near syncope, pain radiation to neck/arm/back/shoulder. Does not appear to be in acute distress in triage. Husband states she woke up crying in pain last night, but patient states she didn't wake due to pain. Hx of thoracic ascending aortic aneurysm, patient does not recall it being of significance.

## 2022-10-21 NOTE — ED Provider Notes (Signed)
[facility]    CSN: [id number] Arrival date & time: 10/21/22  [id number]      History   Chief Complaint Chief Complaint  Patient presents with   Chest Pain    HPI Rebecca Estes is a 89 y.o. female.    Chest Pain  Here for right upper chest pain.  She began hurting 2 or 3 days ago in her right upper chest.  Last night it was worse enough that she was crying because of the pain.  It is improved today, but it does worsen when she abducts her right arm.  No fever or chills and no cough or congestion.  No burping, and no vomiting or diaphoresis.  She does have a history of a thoracic ascending aortic aneurysm.  The last CT I see in epic is 2021, and at that time it was 4.2 cm and stable.  Past Medical History:  Diagnosis Date   Arrhythmia    Bleeding ulcer    Cancer ([place])    Hiatal hernia    Hypertension    Hyperthyroidism    Osteoporosis    Thoracic ascending aortic aneurysm ([place]) 11/06/2017   28mm diagnosed by echo 10/24/17.    Patient Active Problem List   Diagnosis Date Noted   Pain in left knee 12/01/2021   Ceruminosis, right 10/04/2019   Back muscle spasm 02/15/2019   Muscle cramp, nocturnal 12/27/2018   Cough 10/12/2018   Hernia of abdominal wall 10/12/2018   Aortic atherosclerosis ([place]) 07/24/2018   Chronic pain of both shoulders 07/24/2018   BMI less than 19,adult 02/01/2018   Difficulty chewing due to dentures 02/01/2018   Thoracic ascending aortic aneurysm ([place]) 11/06/2017   Palpitations 10/05/2017   Seasonal allergies 09/18/2017   Essential hypertension 05/26/2016   Osteoporosis 05/26/2016   Subclinical hyperthyroidism 05/26/2016    Past Surgical History:  Procedure Laterality Date   ANTERIOR CERVICAL DECOMP/DISCECTOMY FUSION     x3   BREAST BIOPSY Left 02/10/2009   NECK SURGERY     ROTATOR CUFF REPAIR     Left   TUBAL LIGATION     UMBILICAL HERNIA REPAIR      OB History   No obstetric history on file.      Home  Medications    Prior to Admission medications   Medication Sig Start Date End Date Taking? Authorizing Provider  predniSONE (DELTASONE) 20 MG tablet Take 2 tablets (40 mg total) by mouth daily with breakfast for 3 days. 10/21/22 10/24/22 Yes Barrett Henle, MD  alendronate (FOSAMAX) 70 MG tablet Take 1 tablet (70 mg total) by mouth once a week. Take with a full glass of water on an empty stomach. 10/08/19   Lucious Groves, DO  amLODipine (NORVASC) 10 MG tablet TAKE 1 TABLET EVERY DAY 08/24/22   Camillia Herter, NP  aspirin EC 325 MG tablet Take 325 mg by mouth every morning.     [provider]  Cholecalciferol (VITAMIN D3) 25 MCG (1000 UT) CAPS Vitamin D3 1,000 unit capsule  Take 1 capsule every day by oral route. 03/06/19   Harvie Heck, MD  diclofenac Sodium (VOLTAREN) 1 % GEL Apply 2 g topically 4 (four) times daily. 10/20/20   Wieters, Hallie C, PA-C  hydrochlorothiazide (HYDRODIURIL) 50 MG tablet TAKE 1 TABLET EVERY DAY 08/24/22   Camillia Herter, NP  memantine (NAMENDA) 10 MG tablet Take 1 tablet (10 mg total) by mouth 2 (two) times daily. 05/09/22   Penumalli, Earlean Polka,  MD  metoprolol succinate (TOPROL XL) 50 MG 24 hr tablet Take 1 tablet (50 mg total) by mouth daily. Take with or immediately following a meal. 10/08/19 10/07/20  Lucious Groves, DO  pravastatin (PRAVACHOL) 40 MG tablet Take 1 tablet (40 mg total) by mouth every evening. 09/19/22 12/18/22  Camillia Herter, NP    Family History Family History  Problem Relation Age of Onset   Aneurysm Father 71       Brain   Other Mother 23       Blood Infection   Coronary artery disease Brother 34   Stroke Brother 6   Aneurysm Sister 64       Brain   Cervical cancer Sister 67   Kidney failure Sister 29       was on Dialysis   Aneurysm Sister        Brain    Social History Social History   Tobacco Use   Smoking status: Never    Passive exposure: Never   Smokeless tobacco: Never  Substance Use Topics   Alcohol use: No    Drug use: No     Allergies   Patient has no known allergies.   Review of Systems Review of Systems  Cardiovascular:  Positive for chest pain.     Physical Exam Triage Vital Signs ED Triage Vitals [10/21/22 0942]  Enc Vitals Group     BP (!) 162/59     Pulse Rate (!) 53     Resp 16     Temp 97.8 F (36.6 C)     Temp Source Oral     SpO2 96 %     Weight      Height      Head Circumference      Peak Flow      Pain Score 3     Pain Loc      Pain Edu?      Excl. in [place]?    No data found.  Updated Vital Signs BP (!) 162/59 (BP Location: Right Arm)   Pulse (!) 53   Temp 97.8 F (36.6 C) (Oral)   Resp 16   SpO2 96%   Visual Acuity Right Eye Distance:   Left Eye Distance:   Bilateral Distance:    Right Eye Near:   Left Eye Near:    Bilateral Near:     Physical Exam Vitals reviewed.  Constitutional:      General: She is not in acute distress.    Appearance: She is not ill-appearing, toxic-appearing or diaphoretic.  HENT:     Mouth/Throat:     Mouth: Mucous membranes are moist.  Eyes:     Extraocular Movements: Extraocular movements intact.     Conjunctiva/sclera: Conjunctivae normal.     Pupils: Pupils are equal, round, and reactive to light.  Cardiovascular:     Rate and Rhythm: Normal rate and regular rhythm.     Heart sounds: No murmur heard. Pulmonary:     Effort: No respiratory distress.     Breath sounds: No stridor. No wheezing, rhonchi or rales.  Chest:     Chest wall: Tenderness (When I palpate her second and third rib area on the right anteriorly, there is some tenderness.) present.  Musculoskeletal:     Cervical back: Neck supple.  Lymphadenopathy:     Cervical: No cervical adenopathy.  Skin:    Coloration: Skin is not jaundiced or pale.  Neurological:     General: No focal  deficit present.     Mental Status: She is alert and oriented to person, place, and time.  Psychiatric:        Behavior: Behavior normal.      UC Treatments /  Results  Labs (all labs ordered are listed, but only abnormal results are displayed) Labs Reviewed - No data to display  EKG   Radiology DG Chest 2 View  Result Date: 10/21/2022 CLINICAL DATA:  right upper cp x 2-3 days, exacerbated some by moving right arm; h/o 4.2 cm ascending aortic aneurym EXAM: CHEST - 2 VIEW COMPARISON:  03/26/2020 chest radiograph. FINDINGS: Stable cardiomediastinal silhouette with normal heart size. No pneumothorax. No pleural effusion. Lungs appear clear, with no acute consolidative airspace disease and no pulmonary edema. Long segment levocurvature of the thoracolumbar spine, similar. IMPRESSION: No active cardiopulmonary disease. Electronically Signed   By: Ilona Sorrel M.D.   On: 10/21/2022 10:25    Procedures Procedures (including critical care time)  Medications Ordered in UC Medications - No data to display  Initial Impression / Assessment and Plan / UC Course  I have reviewed the triage vital signs and the nursing notes.  Pertinent labs & imaging results that were available during my care of the patient were reviewed by me and considered in my medical decision making (see chart for details).      He shows no ST segment elevation or depression.  Heart rate is 58 on that  Chest x-ray is negative and unchanged from previous.  She does have a good bit of scoliosis noted  I have sent in 3 days of prednisone in case this is some inflammation in her chest wall.  She is going to take Tylenol as needed.  If she worsens in any way she is to present to the emergency room Final Clinical Impressions(s) / UC Diagnoses   Final diagnoses:  Chest wall pain     Discharge Instructions      EKG did not show any new problems.  The chest x-ray did not show any abnormality except for scoliosis that has been there.   You can take Tylenol as needed for pain and you can also use heating pad or heat patch to the sore area.  Take prednisone 20 mg--2 daily for 3  days      ED Prescriptions     Medication Sig Dispense Auth. Provider   predniSONE (DELTASONE) 20 MG tablet Take 2 tablets (40 mg total) by mouth daily with breakfast for 3 days. 6 tablet Windy Carina Gwenlyn Perking, MD      PDMP not reviewed this encounter.   Barrett Henle, MD 10/21/22 1047

## 2022-11-16 NOTE — Progress Notes (Deleted)
Patient ID: Al DecantShirley B Gang, female    DOB: September 09, 1933  MRN: [id number]  CC: Knee Pain  Subjective: Rebecca Estes is a 89 y.o. female who presents for knee pain.   Her concerns today include:  Orthopedics   12/01/2021 [facility] per MD note: Plan: We went forward with another left knee injection of cortisone today.  Hopefully this will give her some good relief.  If she had short-term relief I discussed with her and her husband possibly viscosupplementation.  She will call us if she does not get good relief.  If she wants to continue with periodic steroid injection she certainly could do that.  I have also advised her to start using Voltaren gel    Patient Active Problem List   Diagnosis Date Noted   Pain in left knee 12/01/2021   Ceruminosis, right 10/04/2019   Back muscle spasm 02/15/2019   Muscle cramp, nocturnal 12/27/2018   Cough 10/12/2018   Hernia of abdominal wall 10/12/2018   Aortic atherosclerosis 07/24/2018   Chronic pain of both shoulders 07/24/2018   BMI less than 19,adult 02/01/2018   Difficulty chewing due to dentures 02/01/2018   Thoracic ascending aortic aneurysm 11/06/2017   Palpitations 10/05/2017   Seasonal allergies 09/18/2017   Essential hypertension 05/26/2016   Osteoporosis 05/26/2016   Subclinical hyperthyroidism 05/26/2016     Current Outpatient Medications on File Prior to Visit  Medication Sig Dispense Refill   alendronate (FOSAMAX) 70 MG tablet Take 1 tablet (70 mg total) by mouth once a week. Take with a full glass of water on an empty stomach. 12 tablet 3   amLODipine (NORVASC) 10 MG tablet TAKE 1 TABLET EVERY DAY 90 tablet 1   aspirin EC 325 MG tablet Take 325 mg by mouth every morning.      Cholecalciferol (VITAMIN D3) 25 MCG (1000 UT) CAPS Vitamin D3 1,000 unit capsule  Take 1 capsule every day by oral route. 60 capsule 2   diclofenac Sodium (VOLTAREN) 1 % GEL Apply 2 g topically 4 (four) times daily. 150 g 0    hydrochlorothiazide (HYDRODIURIL) 50 MG tablet TAKE 1 TABLET EVERY DAY 90 tablet 1   memantine (NAMENDA) 10 MG tablet Take 1 tablet (10 mg total) by mouth 2 (two) times daily. 60 tablet 12   metoprolol succinate (TOPROL XL) 50 MG 24 hr tablet Take 1 tablet (50 mg total) by mouth daily. Take with or immediately following a meal. 90 tablet 3   pravastatin (PRAVACHOL) 40 MG tablet Take 1 tablet (40 mg total) by mouth every evening. 30 tablet 2   No current facility-administered medications on file prior to visit.    No Known Allergies  Social History   Socioeconomic History   Marital status: Married    Spouse name: Not on file   Number of children: 5   Years of education: 9   Highest education level: Not on file  Occupational History   Not on file  Tobacco Use   Smoking status: Never    Passive exposure: Never   Smokeless tobacco: Never  Substance and Sexual Activity   Alcohol use: No   Drug use: No   Sexual activity: Not Currently    Partners: Male  Other Topics Concern   Not on file  Social History Narrative   Denies abuse and feels safe at home.    Social Determinants of Health   Financial Resource Strain: Not on file  Food Insecurity: Not on file  Transportation Needs: Not on file  Physical Activity: Not on file  Stress: Not on file  Social Connections: Not on file  Intimate Partner Violence: Not on file    Family History  Problem Relation Age of Onset   Aneurysm Father 67       Brain   Other Mother 34       Blood Infection   Coronary artery disease Brother 75   Stroke Brother 76   Aneurysm Sister 75       Brain   Cervical cancer Sister 24   Kidney failure Sister 53       was on Dialysis   Aneurysm Sister        Brain    Past Surgical History:  Procedure Laterality Date   ANTERIOR CERVICAL DECOMP/DISCECTOMY FUSION     x3   BREAST BIOPSY Left 02/10/2009   NECK SURGERY     ROTATOR CUFF REPAIR     Left   TUBAL LIGATION     UMBILICAL HERNIA  REPAIR      ROS: Review of Systems Negative except as stated above  PHYSICAL EXAM: There were no vitals taken for this visit.  Physical Exam  {female adult master:310786} {female adult master:310785}     Latest Ref Rng & Units 06/07/2022    3:42 PM 08/04/2021    3:15 PM 11/06/2020    5:42 PM  CMP  Glucose 70 - 99 mg/dL 84  84  81   BUN 8 - 27 mg/dL 18  22  20    Creatinine 0.57 - 1.00 mg/dL 1.62  4.46  9.50   Sodium 134 - 144 mmol/L 143  142  142   Potassium 3.5 - 5.2 mmol/L 4.0  4.1  4.4   Chloride 96 - 106 mmol/L 104  104  100   CO2 20 - 29 mmol/L 23  25  24    Calcium 8.7 - 10.3 mg/dL 72.2  57.5  05.1   Total Protein 6.0 - 8.5 g/dL 6.7     Total Bilirubin 0.0 - 1.2 mg/dL <8.3     Alkaline Phos 44 - 121 IU/L 85     AST 0 - 40 IU/L 16     ALT 0 - 32 IU/L 12      Lipid Panel     Component Value Date/Time   CHOL 206 (H) 06/07/2022 1542   TRIG 80 06/07/2022 1542   HDL 88 06/07/2022 1542   CHOLHDL 2.3 06/07/2022 1542   CHOLHDL 3 05/26/2016 0906   VLDL 15.2 05/26/2016 0906   LDLCALC 104 (H) 06/07/2022 1542    CBC    Component Value Date/Time   WBC 4.3 06/07/2022 1542   WBC 3.8 (L) 03/26/2020 2000   RBC 3.63 (L) 06/07/2022 1542   RBC 4.13 03/26/2020 2000   HGB 10.9 (L) 06/07/2022 1542   HCT 32.7 (L) 06/07/2022 1542   PLT 265 06/07/2022 1542   MCV 90 06/07/2022 1542   MCH 30.0 06/07/2022 1542   MCH 31.2 03/26/2020 2000   MCHC 33.3 06/07/2022 1542   MCHC 33.2 03/26/2020 2000   RDW 12.6 06/07/2022 1542   LYMPHSABS 1.8 03/31/2020 0938   MONOABS 0.4 07/01/2017 0952   EOSABS 0.0 03/31/2020 0938   BASOSABS 0.0 03/31/2020 0938    ASSESSMENT AND PLAN:  There are no diagnoses linked to this encounter.   Patient was given the opportunity to ask questions.  Patient verbalized understanding of the plan and was able to repeat key elements  of the plan. Patient was given clear instructions to go to Emergency Department or return to medical center if symptoms don't  improve, worsen, or new problems develop.The patient verbalized understanding.   No orders of the defined types were placed in this encounter.    Requested Prescriptions    No prescriptions requested or ordered in this encounter    No follow-ups on file.  Rema Fendt, NP

## 2022-11-21 ENCOUNTER — Ambulatory Visit: Payer: Medicare HMO | Admitting: Family

## 2022-11-22 NOTE — Progress Notes (Signed)
  Patient ID: Rebecca Estes, female    DOB: 05/14/1934  MRN: [id number]  CC: Knee Pain  Subjective: Rebecca Estes is a 89 y.o. female who presents for knee pain.   Her concerns today include:  Orthopedics   12/01/2021 [place] [facility] per MD note: Plan: We went forward with another left knee injection of cortisone today.  Hopefully this will give her some good relief.  If she had short-term relief I discussed with her and her husband possibly viscosupplementation.  She will call us if she does not get good relief.  If she wants to continue with periodic steroid injection she certainly could do that.  I have also advised her to start using Voltaren gel    Patient Active Problem List   Diagnosis Date Noted   Pain in left knee 12/01/2021   Ceruminosis, right 10/04/2019   Back muscle spasm 02/15/2019   Muscle cramp, nocturnal 12/27/2018   Cough 10/12/2018   Hernia of abdominal wall 10/12/2018   Aortic atherosclerosis 07/24/2018   Chronic pain of both shoulders 07/24/2018   BMI less than 19,adult 02/01/2018   Difficulty chewing due to dentures 02/01/2018   Thoracic ascending aortic aneurysm 11/06/2017   Palpitations 10/05/2017   Seasonal allergies 09/18/2017   Essential hypertension 05/26/2016   Osteoporosis 05/26/2016   Subclinical hyperthyroidism 05/26/2016     Current Outpatient Medications on File Prior to Visit  Medication Sig Dispense Refill   alendronate (FOSAMAX) 70 MG tablet Take 1 tablet (70 mg total) by mouth once a week. Take with a full glass of water on an empty stomach. 12 tablet 3   amLODipine (NORVASC) 10 MG tablet TAKE 1 TABLET EVERY DAY 90 tablet 1   aspirin EC 325 MG tablet Take 325 mg by mouth every morning.      Cholecalciferol (VITAMIN D3) 25 MCG (1000 UT) CAPS Vitamin D3 1,000 unit capsule  Take 1 capsule every day by oral route. 60 capsule 2   diclofenac Sodium (VOLTAREN) 1 % GEL Apply 2 g topically 4 (four) times daily. 150 g 0    hydrochlorothiazide (HYDRODIURIL) 50 MG tablet TAKE 1 TABLET EVERY DAY 90 tablet 1   memantine (NAMENDA) 10 MG tablet Take 1 tablet (10 mg total) by mouth 2 (two) times daily. 60 tablet 12   metoprolol succinate (TOPROL XL) 50 MG 24 hr tablet Take 1 tablet (50 mg total) by mouth daily. Take with or immediately following a meal. 90 tablet 3   pravastatin (PRAVACHOL) 40 MG tablet Take 1 tablet (40 mg total) by mouth every evening. 30 tablet 2   No current facility-administered medications on file prior to visit.    No Known Allergies  Social History   Socioeconomic History   Marital status: Married    Spouse name: Not on file   Number of children: 5   Years of education: 9   Highest education level: Not on file  Occupational History   Not on file  Tobacco Use   Smoking status: Never    Passive exposure: Never   Smokeless tobacco: Never  Substance and Sexual Activity   Alcohol use: No   Drug use: No   Sexual activity: Not Currently    Partners: Male  Other Topics Concern   Not on file  Social History Narrative   Denies abuse and feels safe at home.    Social Determinants of Health   Financial Resource Strain: Not on file  Food Insecurity: Not on file    Transportation Needs: Not on file  Physical Activity: Not on file  Stress: Not on file  Social Connections: Not on file  Intimate Partner Violence: Not on file    Family History  Problem Relation Age of Onset   Aneurysm Father 87       Brain   Other Mother 77       Blood Infection   Coronary artery disease Brother 62   Stroke Brother 62   Aneurysm Sister 47       Brain   Cervical cancer Sister 30   Kidney failure Sister 54       was on Dialysis   Aneurysm Sister        Brain    Past Surgical History:  Procedure Laterality Date   ANTERIOR CERVICAL DECOMP/DISCECTOMY FUSION     x3   BREAST BIOPSY Left 02/10/2009   NECK SURGERY     ROTATOR CUFF REPAIR     Left   TUBAL LIGATION     UMBILICAL HERNIA  REPAIR      ROS: Review of Systems Negative except as stated above  PHYSICAL EXAM: There were no vitals taken for this visit.  Physical Exam  {female adult master:310786} {female adult master:310785}     Latest Ref Rng & Units 06/07/2022    3:42 PM 08/04/2021    3:15 PM 11/06/2020    5:42 PM  CMP  Glucose 70 - 99 mg/dL 84  84  81   BUN 8 - 27 mg/dL 18  22  20   Creatinine 0.57 - 1.00 mg/dL 0.74  0.80  0.84   Sodium 134 - 144 mmol/L 143  142  142   Potassium 3.5 - 5.2 mmol/L 4.0  4.1  4.4   Chloride 96 - 106 mmol/L 104  104  100   CO2 20 - 29 mmol/L 23  25  24   Calcium 8.7 - 10.3 mg/dL 10.7  10.1  10.5   Total Protein 6.0 - 8.5 g/dL 6.7     Total Bilirubin 0.0 - 1.2 mg/dL <0.2     Alkaline Phos 44 - 121 IU/L 85     AST 0 - 40 IU/L 16     ALT 0 - 32 IU/L 12      Lipid Panel     Component Value Date/Time   CHOL 206 (H) 06/07/2022 1542   TRIG 80 06/07/2022 1542   HDL 88 06/07/2022 1542   CHOLHDL 2.3 06/07/2022 1542   CHOLHDL 3 05/26/2016 0906   VLDL 15.2 05/26/2016 0906   LDLCALC 104 (H) 06/07/2022 1542    CBC    Component Value Date/Time   WBC 4.3 06/07/2022 1542   WBC 3.8 (L) 03/26/2020 2000   RBC 3.63 (L) 06/07/2022 1542   RBC 4.13 03/26/2020 2000   HGB 10.9 (L) 06/07/2022 1542   HCT 32.7 (L) 06/07/2022 1542   PLT 265 06/07/2022 1542   MCV 90 06/07/2022 1542   MCH 30.0 06/07/2022 1542   MCH 31.2 03/26/2020 2000   MCHC 33.3 06/07/2022 1542   MCHC 33.2 03/26/2020 2000   RDW 12.6 06/07/2022 1542   LYMPHSABS 1.8 03/31/2020 0938   MONOABS 0.4 07/01/2017 0952   EOSABS 0.0 03/31/2020 0938   BASOSABS 0.0 03/31/2020 0938    ASSESSMENT AND PLAN:  There are no diagnoses linked to this encounter.   Patient was given the opportunity to ask questions.  Patient verbalized understanding of the plan and was able to repeat key elements   of the plan. Patient was given clear instructions to go to Emergency Department or return to medical center if symptoms don't  improve, worsen, or new problems develop.The patient verbalized understanding.   No orders of the defined types were placed in this encounter.    Requested Prescriptions    No prescriptions requested or ordered in this encounter    No follow-ups on file.  Malaquias Lenker J Mahek Schlesinger, NP  

## 2022-11-23 ENCOUNTER — Encounter: Payer: Medicare HMO | Admitting: Family

## 2023-03-09 ENCOUNTER — Ambulatory Visit (INDEPENDENT_AMBULATORY_CARE_PROVIDER_SITE_OTHER): Payer: Medicare HMO

## 2023-03-09 ENCOUNTER — Ambulatory Visit
Admission: EM | Admit: 2023-03-09 | Discharge: 2023-03-09 | Disposition: A | Discharge status: Home / Self Care | Payer: Medicare HMO | Attending: Family Medicine | Admitting: Family Medicine

## 2023-03-09 ENCOUNTER — Encounter: Payer: Self-pay | Admitting: Emergency Medicine

## 2023-03-09 DIAGNOSIS — R0789 Other chest pain: Secondary | ICD-10-CM | POA: Diagnosis not present

## 2023-03-09 DIAGNOSIS — R09A2 Foreign body sensation, throat: Secondary | ICD-10-CM

## 2023-03-09 DIAGNOSIS — R079 Chest pain, unspecified: Secondary | ICD-10-CM | POA: Diagnosis not present

## 2023-03-09 NOTE — Discharge Instructions (Signed)
Your chest x-ray does not show any bony abnormalities or any changes in your chest x-ray.  Please take Tylenol as needed for the chest soreness.  If you feel like the lump in your throat is due to allergies, please try Claritin over-the-counter.  Take famotidine 20 mg--1 tablet 2 times daily.  This is for stomach acid and acid reflux.  Sometimes acid reflux can give people a lump in her throat when acid is washing up into the upper throat.   Please follow-up with your primary care

## 2023-03-09 NOTE — ED Triage Notes (Addendum)
Pt reports a sore throat x 1 week and a lump on her chest. Denies fever. States she just has a sinus problem. Denies taking any OTC medication

## 2023-03-09 NOTE — ED Provider Notes (Addendum)
[facility]    CSN: [id number] Arrival date & time: 03/09/23  1607      History   Chief Complaint Chief Complaint  Patient presents with   Sore Throat    HPI Rebecca Estes is a 89 y.o. female.    Sore Throat  Here for sensation of a lump in her throat that is been going on for about 3 days.  She cannot really say that there is mucus draining in her throat.  No fever or cough or nasal congestion.  About the same time, 3 days ago, she noted a sore area on her lower sternum at the left border.  At times she has told her husband that there is a knot there.    Past Medical History:  Diagnosis Date   Arrhythmia    Bleeding ulcer    Cancer (HCC)    Hiatal hernia    Hypertension    Hyperthyroidism    Osteoporosis    Thoracic ascending aortic aneurysm (HCC) 11/06/2017   41mm diagnosed by echo 10/24/17.    Patient Active Problem List   Diagnosis Date Noted   Pain in left knee 12/01/2021   Ceruminosis, right 10/04/2019   Back muscle spasm 02/15/2019   Muscle cramp, nocturnal 12/27/2018   Cough 10/12/2018   Hernia of abdominal wall 10/12/2018   Aortic atherosclerosis (HCC) 07/24/2018   Chronic pain of both shoulders 07/24/2018   BMI less than 19,adult 02/01/2018   Difficulty chewing due to dentures 02/01/2018   Thoracic ascending aortic aneurysm (HCC) 11/06/2017   Palpitations 10/05/2017   Seasonal allergies 09/18/2017   Essential hypertension 05/26/2016   Osteoporosis 05/26/2016   Subclinical hyperthyroidism 05/26/2016    Past Surgical History:  Procedure Laterality Date   ANTERIOR CERVICAL DECOMP/DISCECTOMY FUSION     x3   BREAST BIOPSY Left 02/10/2009   NECK SURGERY     ROTATOR CUFF REPAIR     Left   TUBAL LIGATION     UMBILICAL HERNIA REPAIR      OB History   No obstetric history on file.      Home Medications    Prior to Admission medications   Medication Sig Start Date End Date Taking? Authorizing Provider  alendronate  (FOSAMAX) 70 MG tablet Take 1 tablet (70 mg total) by mouth once a week. Take with a full glass of water on an empty stomach. 10/08/19   Gust Rung, DO  amLODipine (NORVASC) 10 MG tablet TAKE 1 TABLET EVERY DAY 08/24/22   Rema Fendt, NP  aspirin EC 325 MG tablet Take 325 mg by mouth every morning.     [provider]  Cholecalciferol (VITAMIN D3) 25 MCG (1000 UT) CAPS Vitamin D3 1,000 unit capsule  Take 1 capsule every day by oral route. 03/06/19   Eliezer Bottom, MD  diclofenac Sodium (VOLTAREN) 1 % GEL Apply 2 g topically 4 (four) times daily. 10/20/20   Wieters, Hallie C, PA-C  hydrochlorothiazide (HYDRODIURIL) 50 MG tablet TAKE 1 TABLET EVERY DAY 08/24/22   Rema Fendt, NP  memantine (NAMENDA) 10 MG tablet Take 1 tablet (10 mg total) by mouth 2 (two) times daily. 05/09/22   Penumalli, Glenford Bayley, MD  metoprolol succinate (TOPROL XL) 50 MG 24 hr tablet Take 1 tablet (50 mg total) by mouth daily. Take with or immediately following a meal. 10/08/19 10/07/20  Gust Rung, DO  pravastatin (PRAVACHOL) 40 MG tablet Take 1 tablet (40 mg total) by mouth every evening. 09/19/22  12/18/22  Rema Fendt, NP    Family History Family History  Problem Relation Age of Onset   Aneurysm Father 36       Brain   Other Mother 45       Blood Infection   Coronary artery disease Brother 51   Stroke Brother 58   Aneurysm Sister 56       Brain   Cervical cancer Sister 65   Kidney failure Sister 56       was on Dialysis   Aneurysm Sister        Brain    Social History Social History   Tobacco Use   Smoking status: Never    Passive exposure: Never   Smokeless tobacco: Never  Substance Use Topics   Alcohol use: No   Drug use: No     Allergies   Patient has no known allergies.   Review of Systems Review of Systems   Physical Exam Triage Vital Signs ED Triage Vitals  Encounter Vitals Group     BP 03/09/23 1017 (!) 153/88     Systolic BP Percentile --      Diastolic BP  Percentile --      Pulse Rate 03/09/23 1017 (!) 58     Resp 03/09/23 1017 18     Temp 03/09/23 1017 (!) 97.3 F (36.3 C)     Temp Source 03/09/23 1017 Oral     SpO2 03/09/23 1017 97 %     Weight --      Height --      Head Circumference --      Peak Flow --      Pain Score 03/09/23 1018 8     Pain Loc --      Pain Education --      Exclude from Growth Chart --    No data found.  Updated Vital Signs BP (!) 153/88 (BP Location: Left Arm)   Pulse (!) 58   Temp (!) 97.3 F (36.3 C) (Oral)   Resp 18   SpO2 97%   Visual Acuity Right Eye Distance:   Left Eye Distance:   Bilateral Distance:    Right Eye Near:   Left Eye Near:    Bilateral Near:     Physical Exam Vitals reviewed.  Constitutional:      General: She is not in acute distress.    Appearance: She is not ill-appearing, toxic-appearing or diaphoretic.  HENT:     Nose: Nose normal.     Mouth/Throat:     Mouth: Mucous membranes are moist.     Pharynx: No oropharyngeal exudate or posterior oropharyngeal erythema.     Comments: There is no abnormality on throat exam that I can see today. Eyes:     Extraocular Movements: Extraocular movements intact.     Conjunctiva/sclera: Conjunctivae normal.     Pupils: Pupils are equal, round, and reactive to light.  Cardiovascular:     Rate and Rhythm: Normal rate and regular rhythm.     Heart sounds: No murmur heard. Pulmonary:     Effort: Pulmonary effort is normal. No respiratory distress.     Breath sounds: Normal breath sounds. No stridor. No wheezing, rhonchi or rales.     Comments: She is a little tender at her left lower sternal border near the end of the sternum.  I cannot palpate a nodule or mass.  I can only palpate her ribs. Musculoskeletal:     Cervical back: Neck supple.  No tenderness.  Lymphadenopathy:     Cervical: No cervical adenopathy.  Skin:    Coloration: Skin is not pale.  Neurological:     General: No focal deficit present.     Mental Status:  She is alert and oriented to person, place, and time.  Psychiatric:        Behavior: Behavior normal.      UC Treatments / Results  Labs (all labs ordered are listed, but only abnormal results are displayed) Labs Reviewed - No data to display  EKG   Radiology DG Chest 2 View  Result Date: 03/09/2023 CLINICAL DATA:  central chest soreness x 3 days EXAM: CHEST - 2 VIEW COMPARISON:  October 21, 2022 FINDINGS: The cardiomediastinal silhouette is unchanged in contour. No pleural effusion. No pneumothorax. No acute pleuroparenchymal abnormality. Visualized abdomen is unremarkable. Multilevel degenerative changes of the thoracic spine with levoscoliosis at the thoracolumbar junction. IMPRESSION: No acute cardiopulmonary abnormality. Electronically Signed   By: Meda Klinefelter M.D.   On: 03/09/2023 11:28    Procedures Procedures (including critical care time)  Medications Ordered in UC Medications - No data to display  Initial Impression / Assessment and Plan / UC Course  I have reviewed the triage vital signs and the nursing notes.  Pertinent labs & imaging results that were available during my care of the patient were reviewed by me and considered in my medical decision making (see chart for details).        X-ray does not show any acute changes and no bony abnormalities.  I have asked her to take Tylenol as needed and if she feels that the lump in her throat is due to allergies she could try some Claritin.  I am going to send in a few days worth of Pepcid for her to try in case this is globus due to reflux   I would like her to follow-up with primary care   Final Clinical Impressions(s) / UC Diagnoses   Final diagnoses:  Chest wall pain  Globus pharyngeus     Discharge Instructions      Your chest x-ray does not show any bony abnormalities or any changes in your chest x-ray.  Please take Tylenol as needed for the chest soreness.  If you feel like the lump in your  throat is due to allergies, please try Claritin over-the-counter.  Take famotidine 20 mg--1 tablet 2 times daily.  This is for stomach acid and acid reflux.  Sometimes acid reflux can give people a lump in her throat when acid is washing up into the upper throat.   Please follow-up with your primary care      ED Prescriptions   None    PDMP not reviewed this encounter.   Zenia Resides, MD 03/09/23 1148    Zenia Resides, MD 03/09/23 1149

## 2023-06-26 ENCOUNTER — Ambulatory Visit
Admission: EM | Admit: 2023-06-26 | Discharge: 2023-06-26 | Disposition: A | Discharge status: Home / Self Care | Payer: Medicare HMO | Attending: Internal Medicine | Admitting: Internal Medicine

## 2023-06-26 DIAGNOSIS — K529 Noninfective gastroenteritis and colitis, unspecified: Secondary | ICD-10-CM | POA: Diagnosis not present

## 2023-06-26 MED ORDER — ONDANSETRON 4 MG PO TBDP: 4.0000 mg | ORAL_TABLET | Freq: Three times a day (TID) | ORAL | 0 refills | Status: AC | PRN

## 2023-06-26 MED ORDER — LOPERAMIDE HCL 2 MG PO CAPS: 2.0000 mg | ORAL_CAPSULE | Freq: Two times a day (BID) | ORAL | 0 refills | Status: AC | PRN

## 2023-06-26 MED ORDER — ONDANSETRON 4 MG PO TBDP
4.0000 mg | ORAL_TABLET | Freq: Once | ORAL | Status: AC
  Administered 2023-06-26: 4 mg via ORAL

## 2023-06-26 NOTE — Discharge Instructions (Addendum)

## 2023-06-26 NOTE — ED Provider Notes (Signed)
[facility]  Note:  This document was prepared using Conservation officer, historic buildings and may include unintentional dictation errors.  MRN: [id number] DOB: 1933-12-21  Subjective:   Rebecca Estes is a 89 y.o. female presenting for acute onset today of persistent diarrhea, has had numerous bouts (too many to count).  Has started to have mild low back pains.  Symptoms started after she ate at a restaurant today.  No fever, bloody stools, recent antibiotic use, hospitalizations or long distance travel.  Has not eaten raw foods, drank unfiltered water.  No history of Crohn's, IBS, ulcerative colitis.  She does have a history of a bleeding ulcer.  No current facility-administered medications for this encounter.  Current Outpatient Medications:    alendronate (FOSAMAX) 70 MG tablet, Take 1 tablet (70 mg total) by mouth once a week. Take with a full glass of water on an empty stomach., Disp: 12 tablet, Rfl: 3   amLODipine (NORVASC) 10 MG tablet, TAKE 1 TABLET EVERY DAY, Disp: 90 tablet, Rfl: 1   aspirin EC 325 MG tablet, Take 325 mg by mouth every morning. , Disp: , Rfl:    Cholecalciferol (VITAMIN D3) 25 MCG (1000 UT) CAPS, Vitamin D3 1,000 unit capsule  Take 1 capsule every day by oral route., Disp: 60 capsule, Rfl: 2   diclofenac Sodium (VOLTAREN) 1 % GEL, Apply 2 g topically 4 (four) times daily., Disp: 150 g, Rfl: 0   hydrochlorothiazide (HYDRODIURIL) 50 MG tablet, TAKE 1 TABLET EVERY DAY, Disp: 90 tablet, Rfl: 1   memantine (NAMENDA) 10 MG tablet, Take 1 tablet (10 mg total) by mouth 2 (two) times daily., Disp: 60 tablet, Rfl: 12   metoprolol succinate (TOPROL XL) 50 MG 24 hr tablet, Take 1 tablet (50 mg total) by mouth daily. Take with or immediately following a meal., Disp: 90 tablet, Rfl: 3   pravastatin (PRAVACHOL) 40 MG tablet, Take 1 tablet (40 mg total) by mouth every evening., Disp: 30 tablet, Rfl: 2   No Known Allergies  Past Medical History:   Diagnosis Date   Arrhythmia    Bleeding ulcer    Cancer (HCC)    Hiatal hernia    Hypertension    Hyperthyroidism    Osteoporosis    Thoracic ascending aortic aneurysm (HCC) 11/06/2017   41mm diagnosed by echo 10/24/17.     Past Surgical History:  Procedure Laterality Date   ANTERIOR CERVICAL DECOMP/DISCECTOMY FUSION     x3   BREAST BIOPSY Left 02/10/2009   NECK SURGERY     ROTATOR CUFF REPAIR     Left   TUBAL LIGATION     UMBILICAL HERNIA REPAIR      Family History  Problem Relation Age of Onset   Aneurysm Father 22       Brain   Other Mother 77       Blood Infection   Coronary artery disease Brother 19   Stroke Brother 20   Aneurysm Sister 71       Brain   Cervical cancer Sister 88   Kidney failure Sister 90       was on Dialysis   Aneurysm Sister        Brain    Social History   Tobacco Use   Smoking status: Never    Passive exposure: Never   Smokeless tobacco: Never  Substance Use Topics   Alcohol use: No   Drug use: No    ROS   Objective:  Vitals: BP (!) 170/80 (BP Location: Right Arm)   Pulse 79   Temp 98.5 F (36.9 C) (Oral)   Resp 16   SpO2 99%   Physical Exam Constitutional:      General: She is not in acute distress.    Appearance: Normal appearance. She is well-developed. She is not ill-appearing, toxic-appearing or diaphoretic.  HENT:     Head: Normocephalic and atraumatic.     Nose: Nose normal.     Mouth/Throat:     Mouth: Mucous membranes are moist.     Pharynx: Oropharynx is clear.  Eyes:     General: No scleral icterus.       Right eye: No discharge.        Left eye: No discharge.     Extraocular Movements: Extraocular movements intact.     Conjunctiva/sclera: Conjunctivae normal.  Cardiovascular:     Rate and Rhythm: Normal rate.  Pulmonary:     Effort: Pulmonary effort is normal.  Abdominal:     General: Bowel sounds are increased. There is no distension.     Palpations: Abdomen is soft. There is no mass.      Tenderness: There is no abdominal tenderness. There is no right CVA tenderness, left CVA tenderness, guarding or rebound.  Skin:    General: Skin is warm and dry.  Neurological:     General: No focal deficit present.     Mental Status: She is alert and oriented to person, place, and time.  Psychiatric:        Mood and Affect: Mood normal.        Behavior: Behavior normal.        Thought Content: Thought content normal.        Judgment: Judgment normal.     Assessment and Plan :   PDMP not reviewed this encounter.  1. Colitis    No signs of an acute abdomen.  Will manage for suspected colitis with supportive care.  Recommended patient hydrate well, eat light meals and maintain electrolytes.  Will use Zofran and Imodium for nausea, vomiting and diarrhea. Counseled patient on potential for adverse effects with medications prescribed/recommended today, ER and return-to-clinic precautions discussed, patient verbalized understanding.    Wallis Bamberg, PA-C 06/26/23 1827

## 2023-06-26 NOTE — ED Triage Notes (Signed)
Pt reports diarrhea and low back pain x 2 hrs after she ate a salad in a restaurant today.

## 2023-07-13 ENCOUNTER — Ambulatory Visit
Admission: EM | Admit: 2023-07-13 | Discharge: 2023-07-13 | Disposition: A | Discharge status: Home / Self Care | Payer: Medicare HMO | Attending: Emergency Medicine | Admitting: Emergency Medicine

## 2023-07-13 ENCOUNTER — Encounter: Payer: Self-pay | Admitting: Emergency Medicine

## 2023-07-13 DIAGNOSIS — K5900 Constipation, unspecified: Secondary | ICD-10-CM | POA: Diagnosis not present

## 2023-07-13 MED ORDER — DOCUSATE SODIUM 100 MG PO CAPS: 100.0000 mg | ORAL_CAPSULE | Freq: Two times a day (BID) | ORAL | 0 refills | Status: AC

## 2023-07-13 MED ORDER — GLYCERIN (ADULT) 2 G RE SUPP: 1.0000 | RECTAL | 0 refills | Status: AC | PRN

## 2023-07-13 NOTE — ED Triage Notes (Signed)
Pt c/o constipation had a bowel movement yesterday reports it was hard. No blood in stool.

## 2023-07-13 NOTE — Discharge Instructions (Signed)
Take docusate as prescribed daily to help keep stool soft. Use glycerin suppository as prescribed as needed for more acute bouts of constipation such as today. Make sure to keep hydrated - recommend trying flavored water or things like Liquid IV packets to help with flavor of water. Try to drink 64 ounces a day if possible.  Follow-up with PCP or GI if continued problems with constipation. Go to the ER if develop severe abdominal pain, bleeding, or inability to pass a bowel movement for more than a week.

## 2023-07-13 NOTE — ED Provider Notes (Signed)
[facility]    CSN: [id number] Arrival date & time: 07/13/23  1253      History   Chief Complaint Chief Complaint  Patient presents with   Constipation    HPI Rebecca Estes is a 89 y.o. female.   Patient presents with concerns of constipation. She reports history of intermittent problems with constipation in the past. She states she normally has one bowel movement a day in the morning and that it is soft and "normal". The patient reports yesterday it was harder to go and came out in small hard pieces. Today she has been unable to go much but feels like she needs to. She denies seeing any blood with bowel movements. She reports some pressure in her back that resolves when she passes a bowel movement. She does not take anything regularly for constipation and admits to not drinking much water, only about a cup a day. She denies any abdominal pain, nausea, vomiting, fever, or feeling unwell otherwise.   The history is provided by the patient.  Constipation Associated symptoms: no abdominal pain, no back pain, no diarrhea, no fever, no nausea and no vomiting     Past Medical History:  Diagnosis Date   Arrhythmia    Bleeding ulcer    Cancer (HCC)    Hiatal hernia    Hypertension    Hyperthyroidism    Osteoporosis    Thoracic ascending aortic aneurysm (HCC) 11/06/2017   41mm diagnosed by echo 10/24/17.    Patient Active Problem List   Diagnosis Date Noted   Pain in left knee 12/01/2021   Ceruminosis, right 10/04/2019   Back muscle spasm 02/15/2019   Muscle cramp, nocturnal 12/27/2018   Cough 10/12/2018   Hernia of abdominal wall 10/12/2018   Aortic atherosclerosis (HCC) 07/24/2018   Chronic pain of both shoulders 07/24/2018   BMI less than 19,adult 02/01/2018   Difficulty chewing due to dentures 02/01/2018   Thoracic ascending aortic aneurysm (HCC) 11/06/2017   Palpitations 10/05/2017   Seasonal allergies 09/18/2017   Essential hypertension 05/26/2016    Osteoporosis 05/26/2016   Subclinical hyperthyroidism 05/26/2016    Past Surgical History:  Procedure Laterality Date   ANTERIOR CERVICAL DECOMP/DISCECTOMY FUSION     x3   BREAST BIOPSY Left 02/10/2009   NECK SURGERY     ROTATOR CUFF REPAIR     Left   TUBAL LIGATION     UMBILICAL HERNIA REPAIR      OB History   No obstetric history on file.      Home Medications    Prior to Admission medications   Medication Sig Start Date End Date Taking? Authorizing Provider  docusate sodium (COLACE) 100 MG capsule Take 1 capsule (100 mg total) by mouth every 12 (twelve) hours. 07/13/23  Yes Amada Hallisey L, PA  glycerin adult 2 g suppository Place 1 suppository rectally as needed for constipation. 07/13/23  Yes Neri Vieyra L, PA  alendronate (FOSAMAX) 70 MG tablet Take 1 tablet (70 mg total) by mouth once a week. Take with a full glass of water on an empty stomach. 10/08/19   Gust Rung, DO  amLODipine (NORVASC) 10 MG tablet TAKE 1 TABLET EVERY DAY 08/24/22   Rema Fendt, NP  aspirin EC 325 MG tablet Take 325 mg by mouth every morning.     [provider]  Cholecalciferol (VITAMIN D3) 25 MCG (1000 UT) CAPS Vitamin D3 1,000 unit capsule  Take 1 capsule every day by oral route. 03/06/19  Eliezer Bottom, MD  diclofenac Sodium (VOLTAREN) 1 % GEL Apply 2 g topically 4 (four) times daily. 10/20/20   Wieters, Hallie C, PA-C  hydrochlorothiazide (HYDRODIURIL) 50 MG tablet TAKE 1 TABLET EVERY DAY 08/24/22   Rema Fendt, NP  loperamide (IMODIUM) 2 MG capsule Take 1 capsule (2 mg total) by mouth 2 (two) times daily as needed for diarrhea or loose stools. 06/26/23   Wallis Bamberg, PA-C  memantine (NAMENDA) 10 MG tablet Take 1 tablet (10 mg total) by mouth 2 (two) times daily. 05/09/22   Penumalli, Glenford Bayley, MD  metoprolol succinate (TOPROL XL) 50 MG 24 hr tablet Take 1 tablet (50 mg total) by mouth daily. Take with or immediately following a meal. 10/08/19 10/07/20  Gust Rung, DO  ondansetron  (ZOFRAN-ODT) 4 MG disintegrating tablet Take 1 tablet (4 mg total) by mouth every 8 (eight) hours as needed for nausea or vomiting. 06/26/23   Wallis Bamberg, PA-C  pravastatin (PRAVACHOL) 40 MG tablet Take 1 tablet (40 mg total) by mouth every evening. 09/19/22 12/18/22  Rema Fendt, NP    Family History Family History  Problem Relation Age of Onset   Aneurysm Father 69       Brain   Other Mother 69       Blood Infection   Coronary artery disease Brother 25   Stroke Brother 34   Aneurysm Sister 46       Brain   Cervical cancer Sister 29   Kidney failure Sister 13       was on Dialysis   Aneurysm Sister        Brain    Social History Social History   Tobacco Use   Smoking status: Never    Passive exposure: Never   Smokeless tobacco: Never  Substance Use Topics   Alcohol use: No   Drug use: No     Allergies   Patient has no known allergies.   Review of Systems Review of Systems  Constitutional:  Negative for fatigue and fever.  Gastrointestinal:  Positive for constipation. Negative for abdominal distention, abdominal pain, anal bleeding, blood in stool, diarrhea, nausea, rectal pain and vomiting.  Musculoskeletal:  Negative for back pain and myalgias.  Skin:  Negative for rash.  Neurological:  Negative for dizziness and headaches.     Physical Exam Triage Vital Signs ED Triage Vitals [07/13/23 1312]  Encounter Vitals Group     BP (!) 152/79     Systolic BP Percentile      Diastolic BP Percentile      Pulse Rate 86     Resp 18     Temp 97.7 F (36.5 C)     Temp Source Oral     SpO2 98 %     Weight      Height      Head Circumference      Peak Flow      Pain Score 0     Pain Loc      Pain Education      Exclude from Growth Chart    No data found.  Updated Vital Signs BP (!) 152/79 (BP Location: Right Arm)   Pulse 86   Temp 97.7 F (36.5 C) (Oral)   Resp 18   SpO2 98%   Visual Acuity Right Eye Distance:   Left Eye Distance:   Bilateral  Distance:    Right Eye Near:   Left Eye Near:    Bilateral Near:  Physical Exam Vitals and nursing note reviewed.  Constitutional:      General: She is not in acute distress. Cardiovascular:     Rate and Rhythm: Normal rate and regular rhythm.     Heart sounds: Normal heart sounds.  Pulmonary:     Effort: Pulmonary effort is normal.     Breath sounds: Normal breath sounds.  Abdominal:     General: There is no distension.     Palpations: Abdomen is soft. There is no mass.     Tenderness: There is no abdominal tenderness. There is no right CVA tenderness, left CVA tenderness, guarding or rebound.  Skin:    Findings: No rash.  Neurological:     Mental Status: She is alert.  Psychiatric:        Mood and Affect: Mood normal.      UC Treatments / Results  Labs (all labs ordered are listed, but only abnormal results are displayed) Labs Reviewed - No data to display  EKG   Radiology No results found.  Procedures Procedures (including critical care time)  Medications Ordered in UC Medications - No data to display  Initial Impression / Assessment and Plan / UC Course  I have reviewed the triage vital signs and the nursing notes.  Pertinent labs & imaging results that were available during my care of the patient were reviewed by me and considered in my medical decision making (see chart for details).     Discussed importance of staying hydrated to help with chronic constipation problems. Rx for daily stool softener, and glycerin suppository for her to use today to help with acute constipation and as needed in future. Recommendation to follow-up with PCP or GI and discussed ER precautions.   E/M: 1 acute uncomplicated illness, no data, moderate risk due to prescription management  Final Clinical Impressions(s) / UC Diagnoses   Final diagnoses:  Constipation, unspecified constipation type     Discharge Instructions      Take docusate as prescribed daily to  help keep stool soft. Use glycerin suppository as prescribed as needed for more acute bouts of constipation such as today. Make sure to keep hydrated - recommend trying flavored water or things like Liquid IV packets to help with flavor of water. Try to drink 64 ounces a day if possible.  Follow-up with PCP or GI if continued problems with constipation. Go to the ER if develop severe abdominal pain, bleeding, or inability to pass a bowel movement for more than a week.    ED Prescriptions     Medication Sig Dispense Auth. Provider   docusate sodium (COLACE) 100 MG capsule Take 1 capsule (100 mg total) by mouth every 12 (twelve) hours. 60 capsule Jaydn Fincher L, PA   glycerin adult 2 g suppository Place 1 suppository rectally as needed for constipation. 12 suppository Vallery Sa, Colum Colt L, PA      PDMP not reviewed this encounter.   Estanislado Pandy, [place] 07/13/23 1359

## 2023-10-06 ENCOUNTER — Encounter: Payer: Medicare HMO | Admitting: Family

## 2023-10-06 ENCOUNTER — Ambulatory Visit: Payer: Medicare HMO

## 2023-10-06 NOTE — Progress Notes (Signed)
 Erroneous encounter-disregard

## 2023-10-23 ENCOUNTER — Ambulatory Visit (INDEPENDENT_AMBULATORY_CARE_PROVIDER_SITE_OTHER): Admitting: Family

## 2023-10-23 VITALS — BP 182/90 | HR 70 | Temp 98.5°F | Ht 68.0 in | Wt 109.2 lb

## 2023-10-23 DIAGNOSIS — I10 Essential (primary) hypertension: Secondary | ICD-10-CM

## 2023-10-23 DIAGNOSIS — Z91148 Patient's other noncompliance with medication regimen for other reason: Secondary | ICD-10-CM | POA: Diagnosis not present

## 2023-10-23 DIAGNOSIS — M25562 Pain in left knee: Secondary | ICD-10-CM

## 2023-10-23 NOTE — Progress Notes (Signed)
 Patient states knee pain.

## 2023-10-23 NOTE — Progress Notes (Signed)
 Patient ID: MAECI KALBFLEISCH, female    DOB: Apr 28, 1934  MRN: [id number]  CC: Knee Pain  Subjective: Rebecca Estes is a 89 y.o. female who presents for knee pain. She is accompanied by her husband.   Her concerns today include:  - Intermittent left knee pain. Denies recent trauma/injury and red flag symptoms. States in the past she was given a pain injection in the Emergency Department which helped and no left knee pain since then. - Husband states patient only takes her blood pressure medications every few days. Patient states she is fine without taking blood pressure medications. Patient does not complain of red flag symptoms such as but not limited to chest pain, shortness of breath, worst headache of life, nausea/vomiting.   Patient Active Problem List   Diagnosis Date Noted   Pain in left knee 12/01/2021   Ceruminosis, right 10/04/2019   Back muscle spasm 02/15/2019   Muscle cramp, nocturnal 12/27/2018   Cough 10/12/2018   Hernia of abdominal wall 10/12/2018   Aortic atherosclerosis (HCC) 07/24/2018   Chronic pain of both shoulders 07/24/2018   BMI less than 19,adult 02/01/2018   Difficulty chewing due to dentures 02/01/2018   Thoracic ascending aortic aneurysm (HCC) 11/06/2017   Palpitations 10/05/2017   Seasonal allergies 09/18/2017   Essential hypertension 05/26/2016   Osteoporosis 05/26/2016   Subclinical hyperthyroidism 05/26/2016     Current Outpatient Medications on File Prior to Visit  Medication Sig Dispense Refill   alendronate (FOSAMAX) 70 MG tablet Take 1 tablet (70 mg total) by mouth once a week. Take with a full glass of water on an empty stomach. (Patient not taking: Reported on 10/23/2023) 12 tablet 3   amLODipine (NORVASC) 10 MG tablet TAKE 1 TABLET EVERY DAY (Patient not taking: Reported on 10/23/2023) 90 tablet 1   aspirin EC 325 MG tablet Take 325 mg by mouth every morning.  (Patient not taking: Reported on 10/23/2023)     Cholecalciferol  (VITAMIN D3) 25 MCG (1000 UT) CAPS Vitamin D3 1,000 unit capsule  Take 1 capsule every day by oral route. (Patient not taking: Reported on 10/23/2023) 60 capsule 2   diclofenac Sodium (VOLTAREN) 1 % GEL Apply 2 g topically 4 (four) times daily. (Patient not taking: Reported on 10/23/2023) 150 g 0   docusate sodium (COLACE) 100 MG capsule Take 1 capsule (100 mg total) by mouth every 12 (twelve) hours. (Patient not taking: Reported on 10/23/2023) 60 capsule 0   glycerin adult 2 g suppository Place 1 suppository rectally as needed for constipation. (Patient not taking: Reported on 10/23/2023) 12 suppository 0   hydrochlorothiazide (HYDRODIURIL) 50 MG tablet TAKE 1 TABLET EVERY DAY (Patient not taking: Reported on 10/23/2023) 90 tablet 1   loperamide (IMODIUM) 2 MG capsule Take 1 capsule (2 mg total) by mouth 2 (two) times daily as needed for diarrhea or loose stools. (Patient not taking: Reported on 10/23/2023) 14 capsule 0   memantine (NAMENDA) 10 MG tablet Take 1 tablet (10 mg total) by mouth 2 (two) times daily. (Patient not taking: Reported on 10/23/2023) 60 tablet 12   metoprolol succinate (TOPROL XL) 50 MG 24 hr tablet Take 1 tablet (50 mg total) by mouth daily. Take with or immediately following a meal. (Patient not taking: Reported on 10/23/2023) 90 tablet 3   ondansetron (ZOFRAN-ODT) 4 MG disintegrating tablet Take 1 tablet (4 mg total) by mouth every 8 (eight) hours as needed for nausea or vomiting. (Patient not taking: Reported on 10/23/2023) 20 tablet  0   pravastatin (PRAVACHOL) 40 MG tablet Take 1 tablet (40 mg total) by mouth every evening. (Patient not taking: Reported on 10/23/2023) 30 tablet 2   No current facility-administered medications on file prior to visit.    No Known Allergies  Social History   Socioeconomic History   Marital status: Married    Spouse name: Not on file   Number of children: 5   Years of education: 9   Highest education level: Not on file  Occupational History    Not on file  Tobacco Use   Smoking status: Never    Passive exposure: Never   Smokeless tobacco: Never  Substance and Sexual Activity   Alcohol use: No   Drug use: No   Sexual activity: Not Currently    Partners: Male  Other Topics Concern   Not on file  Social History Narrative   Denies abuse and feels safe at home.    Social Drivers of Corporate investment banker Strain: Not on file  Food Insecurity: Not on file  Transportation Needs: Not on file  Physical Activity: Not on file  Stress: Not on file  Social Connections: Not on file  Intimate Partner Violence: Not on file    Family History  Problem Relation Age of Onset   Aneurysm Father 6       Brain   Other Mother 20       Blood Infection   Coronary artery disease Brother 82   Stroke Brother 71   Aneurysm Sister 47       Brain   Cervical cancer Sister 69   Kidney failure Sister 31       was on Dialysis   Aneurysm Sister        Brain    Past Surgical History:  Procedure Laterality Date   ANTERIOR CERVICAL DECOMP/DISCECTOMY FUSION     x3   BREAST BIOPSY Left 02/10/2009   NECK SURGERY     ROTATOR CUFF REPAIR     Left   TUBAL LIGATION     UMBILICAL HERNIA REPAIR      ROS: Review of Systems Negative except as stated above  PHYSICAL EXAM: BP (!) 182/90   Pulse 70   Temp 98.5 F (36.9 C) (Oral)   Ht 5\' 8"  (1.727 m)   Wt 109 lb 3.2 oz (49.5 kg)   SpO2 93%   BMI 16.60 kg/m   Physical Exam HENT:     Head: Normocephalic and atraumatic.     Nose: Nose normal.     Mouth/Throat:     Mouth: Mucous membranes are moist.     Pharynx: Oropharynx is clear.  Eyes:     Extraocular Movements: Extraocular movements intact.     Conjunctiva/sclera: Conjunctivae normal.     Pupils: Pupils are equal, round, and reactive to light.  Cardiovascular:     Rate and Rhythm: Normal rate and regular rhythm.     Pulses: Normal pulses.     Heart sounds: Normal heart sounds.  Pulmonary:     Effort: Pulmonary effort is  normal.     Breath sounds: Normal breath sounds.  Musculoskeletal:        General: Normal range of motion.     Cervical back: Normal range of motion and neck supple.     Right hip: Normal.     Left hip: Normal.     Right upper leg: Normal.     Left upper leg: Normal.  Right knee: Normal.     Left knee: Normal.     Right lower leg: Normal.     Left lower leg: Normal.     Right ankle: Normal.     Left ankle: Normal.     Right foot: Normal.     Left foot: Normal.  Neurological:     General: No focal deficit present.     Mental Status: She is alert and oriented to person, place, and time.  Psychiatric:        Mood and Affect: Mood normal.        Behavior: Behavior normal.     ASSESSMENT AND PLAN: 1. Left knee pain, unspecified chronicity (Primary) - Referral to Orthopedic Surgery for evaluation/management. - Ambulatory referral to Orthopedic Surgery  2. Uncontrolled hypertension 3. Nonadherence to medication - Blood pressure not at goal during today's visit. Patient asymptomatic without chest pressure, chest pain, palpitations, shortness of breath, worst headache of life, and any additional red flag symptoms. - Continue present management. Counseled on medication adherence. - Counseled on blood pressure goal of less than 140/90, low-sodium, DASH diet, medication compliance, 150 minutes of moderate intensity exercise per week as tolerated. Discussed medication compliance, adverse effects. - Referral to Cardiology for evaluation/management. - Ambulatory referral to Cardiology    Patient was given the opportunity to ask questions.  Patient verbalized understanding of the plan and was able to repeat key elements of the plan. Patient was given clear instructions to go to Emergency Department or return to medical center if symptoms don't improve, worsen, or new problems develop.The patient verbalized understanding.   Orders Placed This Encounter  Procedures   Ambulatory  referral to Orthopedic Surgery   Ambulatory referral to Cardiology    Follow-up with primary provider as scheduled.  Rema Fendt, NP

## 2023-10-31 ENCOUNTER — Telehealth: Payer: Self-pay | Admitting: Physical Medicine and Rehabilitation

## 2024-01-15 ENCOUNTER — Encounter: Payer: Self-pay | Admitting: Emergency Medicine

## 2024-01-15 ENCOUNTER — Ambulatory Visit
Admission: EM | Admit: 2024-01-15 | Discharge: 2024-01-15 | Disposition: A | Discharge status: Home / Self Care | Attending: Physician Assistant | Admitting: Physician Assistant

## 2024-01-15 DIAGNOSIS — I10 Essential (primary) hypertension: Secondary | ICD-10-CM

## 2024-01-15 DIAGNOSIS — K047 Periapical abscess without sinus: Secondary | ICD-10-CM | POA: Diagnosis not present

## 2024-01-15 DIAGNOSIS — M26622 Arthralgia of left temporomandibular joint: Secondary | ICD-10-CM

## 2024-01-15 MED ORDER — AMOXICILLIN-POT CLAVULANATE 500-125 MG PO TABS: 1.0000 | ORAL_TABLET | Freq: Two times a day (BID) | ORAL | 0 refills | Status: AC

## 2024-01-15 NOTE — ED Triage Notes (Addendum)
 Pt presents c/o dental pain on upper left side of mouth. Pt says there is a knot on her gums right beneath thwe bone. Sxs onset about 3 weeks ago. Pain comes and goes. Does not affect chewing.

## 2024-01-15 NOTE — Discharge Instructions (Addendum)
 We are treating you for a dental infection.  Take Augmentin twice daily for 7 days.  Use Tylenol for pain.  You can use a warm compress on the area that is bothersome.  Follow-up with your primary care next week.  If anything worsens and you cannot open your jaw, fever, difficulty chewing, swelling of your throat, shortness of breath, muffled voice you need to be seen immediately.  Your blood pressure is elevated.  Please monitor this at home.  Follow-up with your primary care this week for recheck.  If you develop any headache, chest pain, shortness of breath, vision change in the setting of high blood pressure you need to go to the ER.

## 2024-01-15 NOTE — ED Provider Notes (Signed)
 [facility]    CSN: [id number] Arrival date & time: 01/15/24  1884      History   Chief Complaint Chief Complaint  Patient presents with   Dental Pain    HPI Rebecca Estes is a 89 y.o. female.   Patient presents today 3-week history of pain at her TMJ.  She reports associated pain with chewing but is able to eat and drink normally.  She has not been taking any over-the-counter medication for symptom management.  She does report that right before her symptoms began she was seen at a dentist and had a deep cleaning due to her history of gingivitis.  Denies any recent dental extractions or other dental procedure.  She is not experiencing any kind of swelling of her throat, shortness of breath, muffled voice.  She is able to open her jaw normally but does report that this increases the pain.  She reports normal saliva production.  Denies any fever, nausea, vomiting.  Her blood pressure is elevated.  She denies any chest pain, shortness of breath, headache, vision change, dizziness.  She has not been taking NSAIDs.  Denies any decongestants, caffeine, sodium, NSAID use.  She does have a primary care but has not seen them recently.  She does have a history of hypertension and reports compliance with her medication including amlodipine  and hydrochlorothiazide .     Past Medical History:  Diagnosis Date   Arrhythmia    Bleeding ulcer    Cancer (HCC)    Hiatal hernia    Hypertension    Hyperthyroidism    Osteoporosis    Thoracic ascending aortic aneurysm (HCC) 11/06/2017   41mm diagnosed by echo 10/24/17.    Patient Active Problem List   Diagnosis Date Noted   Pain in left knee 12/01/2021   Ceruminosis, right 10/04/2019   Back muscle spasm 02/15/2019   Muscle cramp, nocturnal 12/27/2018   Cough 10/12/2018   Hernia of abdominal wall 10/12/2018   Aortic atherosclerosis (HCC) 07/24/2018   Chronic pain of both shoulders 07/24/2018   BMI less than 19,adult 02/01/2018    Difficulty chewing due to dentures 02/01/2018   Thoracic ascending aortic aneurysm (HCC) 11/06/2017   Palpitations 10/05/2017   Seasonal allergies 09/18/2017   Essential hypertension 05/26/2016   Osteoporosis 05/26/2016   Subclinical hyperthyroidism 05/26/2016    Past Surgical History:  Procedure Laterality Date   ANTERIOR CERVICAL DECOMP/DISCECTOMY FUSION     x3   BREAST BIOPSY Left 02/10/2009   NECK SURGERY     ROTATOR CUFF REPAIR     Left   TUBAL LIGATION     UMBILICAL HERNIA REPAIR      OB History   No obstetric history on file.      Home Medications    Prior to Admission medications   Medication Sig Start Date End Date Taking? Authorizing Provider  amoxicillin -clavulanate (AUGMENTIN) 500-125 MG tablet Take 1 tablet by mouth in the morning and at bedtime. 01/15/24  Yes Markevius Trombetta K, PA-C  alendronate  (FOSAMAX ) 70 MG tablet Take 1 tablet (70 mg total) by mouth once a week. Take with a full glass of water on an empty stomach. Patient not taking: Reported on 10/23/2023 10/08/19   Priscella Brooms, DO  amLODipine  (NORVASC ) 10 MG tablet TAKE 1 TABLET EVERY DAY Patient not taking: Reported on 10/23/2023 08/24/22   Senaida Dama, NP  aspirin EC 325 MG tablet Take 325 mg by mouth every morning.  Patient not taking: Reported on 10/23/2023  [provider]  Cholecalciferol (VITAMIN D3) 25 MCG (1000 UT) CAPS Vitamin D3 1,000 unit capsule  Take 1 capsule every day by oral route. Patient not taking: Reported on 10/23/2023 03/06/19   Aslam, Sadia, MD  diclofenac  Sodium (VOLTAREN ) 1 % GEL Apply 2 g topically 4 (four) times daily. Patient not taking: Reported on 10/23/2023 10/20/20   Wieters, Hallie C, PA-C  docusate sodium  (COLACE) 100 MG capsule Take 1 capsule (100 mg total) by mouth every 12 (twelve) hours. Patient not taking: Reported on 10/23/2023 07/13/23   Emmit Harold, PA  glycerin  adult 2 g suppository Place 1 suppository rectally as needed for constipation. Patient not  taking: Reported on 10/23/2023 07/13/23   Emmit Harold, PA  hydrochlorothiazide  (HYDRODIURIL ) 50 MG tablet TAKE 1 TABLET EVERY DAY Patient not taking: Reported on 10/23/2023 08/24/22   Senaida Dama, NP  loperamide  (IMODIUM ) 2 MG capsule Take 1 capsule (2 mg total) by mouth 2 (two) times daily as needed for diarrhea or loose stools. Patient not taking: Reported on 10/23/2023 06/26/23   Adolph Hoop, PA-C  memantine  (NAMENDA ) 10 MG tablet Take 1 tablet (10 mg total) by mouth 2 (two) times daily. Patient not taking: Reported on 10/23/2023 05/09/22   Penumalli, Vikram R, MD  metoprolol  succinate (TOPROL  XL) 50 MG 24 hr tablet Take 1 tablet (50 mg total) by mouth daily. Take with or immediately following a meal. Patient not taking: Reported on 10/23/2023 10/08/19 10/07/20  Priscella Brooms, DO  ondansetron  (ZOFRAN -ODT) 4 MG disintegrating tablet Take 1 tablet (4 mg total) by mouth every 8 (eight) hours as needed for nausea or vomiting. Patient not taking: Reported on 10/23/2023 06/26/23   Adolph Hoop, PA-C  pravastatin  (PRAVACHOL ) 40 MG tablet Take 1 tablet (40 mg total) by mouth every evening. Patient not taking: Reported on 10/23/2023 09/19/22 12/18/22  Senaida Dama, NP    Family History Family History  Problem Relation Age of Onset   Aneurysm Father 73       Brain   Other Mother 13       Blood Infection   Coronary artery disease Brother 12   Stroke Brother 31   Aneurysm Sister 67       Brain   Cervical cancer Sister 21   Kidney failure Sister 60       was on Dialysis   Aneurysm Sister        Brain    Social History Social History   Tobacco Use   Smoking status: Never    Passive exposure: Never   Smokeless tobacco: Never  Vaping Use   Vaping status: Never Used  Substance Use Topics   Alcohol use: No   Drug use: No     Allergies   Patient has no known allergies.   Review of Systems Review of Systems  Constitutional:  Positive for activity change. Negative for appetite change,  fatigue and fever.  HENT:  Positive for dental problem. Negative for congestion, facial swelling, sore throat, trouble swallowing and voice change.   Eyes:  Negative for visual disturbance.  Respiratory:  Negative for cough and shortness of breath.   Cardiovascular:  Negative for chest pain.  Gastrointestinal:  Negative for abdominal pain, diarrhea, nausea and vomiting.  Neurological:  Negative for dizziness, light-headedness and headaches.     Physical Exam Triage Vital Signs ED Triage Vitals  Encounter Vitals Group     BP 01/15/24 0952 (!) 172/93     Systolic BP Percentile --  Diastolic BP Percentile --      Pulse Rate 01/15/24 0952 (!) 54     Resp 01/15/24 0952 18     Temp 01/15/24 0952 98.5 F (36.9 C)     Temp Source 01/15/24 0952 Oral     SpO2 01/15/24 0952 95 %     Weight 01/15/24 0951 109 lb 2 oz (49.5 kg)     Height --      Head Circumference --      Peak Flow --      Pain Score 01/15/24 0949 5     Pain Loc --      Pain Education --      Exclude from Growth Chart --    No data found.  Updated Vital Signs BP (!) 172/83 (BP Location: Left Arm)   Pulse (!) 59   Temp 98.5 F (36.9 C) (Oral)   Resp 18   Wt 109 lb 2 oz (49.5 kg)   SpO2 100%   BMI 16.59 kg/m   Visual Acuity Right Eye Distance:   Left Eye Distance:   Bilateral Distance:    Right Eye Near:   Left Eye Near:    Bilateral Near:     Physical Exam Vitals reviewed.  Constitutional:      General: She is awake. She is not in acute distress.    Appearance: Normal appearance. She is well-developed. She is not ill-appearing.     Comments: Very pleasant female appears stated age in no acute distress sitting comfortably in exam room  HENT:     Head: Normocephalic and atraumatic.     Right Ear: Tympanic membrane, ear canal and external ear normal. Tympanic membrane is not erythematous or bulging.     Left Ear: Tympanic membrane, ear canal and external ear normal. Tympanic membrane is not  erythematous or bulging.     Nose:     Right Sinus: No maxillary sinus tenderness or frontal sinus tenderness.     Left Sinus: No maxillary sinus tenderness or frontal sinus tenderness.     Mouth/Throat:     Dentition: Abnormal dentition. Gingival swelling present.     Pharynx: Uvula midline. No oropharyngeal exudate or posterior oropharyngeal erythema.     Comments: Significant swelling of gingiva on left upper jaw without abscess.  Tenderness over left TMJ with associated bilateral crepitus. Cardiovascular:     Rate and Rhythm: Regular rhythm. Bradycardia present.     Heart sounds: Normal heart sounds, S1 normal and S2 normal. No murmur heard. Pulmonary:     Effort: Pulmonary effort is normal.     Breath sounds: Normal breath sounds. No wheezing, rhonchi or rales.     Comments: Clear to auscultation bilaterally Lymphadenopathy:     Head:     Right side of head: No submental, submandibular or tonsillar adenopathy.     Left side of head: No submental, submandibular or tonsillar adenopathy.     Cervical: No cervical adenopathy.  Psychiatric:        Behavior: Behavior is cooperative.      UC Treatments / Results  Labs (all labs ordered are listed, but only abnormal results are displayed) Labs Reviewed - No data to display  EKG   Radiology No results found.  Procedures Procedures (including critical care time)  Medications Ordered in UC Medications - No data to display  Initial Impression / Assessment and Plan / UC Course  I have reviewed the triage vital signs and the nursing notes.  Pertinent labs &  imaging results that were available during my care of the patient were reviewed by me and considered in my medical decision making (see chart for details).     Patient is well-appearing, afebrile, nontoxic, nontachycardic.  Unclear etiology of symptoms but she does have significant gingival swelling around the area that she is experiencing pain so will cover for dental  infection particular given she had a deep cleaning within the past few weeks.  She was started on Augmentin twice daily for 7 days with instruction to gargle with warm salt water for additional symptom relief.  Notification for dose adjustment based on metabolic panel from 06/07/2022 with creatinine of 0.74 and calculated creatinine clearance of 41.02 mL/min.  Recommend close follow-up with her primary care.  We discussed that TMJ could also be contributing to her symptoms and so she was encouraged to use warm compresses on this area as well as use over-the-counter Tylenol.  Unfortunately, we do not have imaging capabilities of the mandible in urgent care as we discussed that if her symptoms are not improving or if anything worsens she should follow-up with her primary care.  Strict return precautions given.  Her blood pressure is elevated but she denied any signs/symptoms of endorgan damage.  Recommend that she follow-up with her primary care within the week for medication adjustment.  She is to avoid NSAIDs, decongestants, caffeine, sodium.  Recommend that she monitor her blood pressure at home and keep a log for evaluation of follow-up appointment.  If she has any worsening or changing symptoms she needs to be seen immediately including headache, chest pain, shortness of breath, visual disturbance in the setting of high blood pressure.  Strict return precautions given.  All questions answered to her satisfaction.  Final Clinical Impressions(s) / UC Diagnoses   Final diagnoses:  Dental infection  Arthralgia of left temporomandibular joint  Elevated blood pressure reading with diagnosis of hypertension     Discharge Instructions      We are treating you for a dental infection.  Take Augmentin twice daily for 7 days.  Use Tylenol for pain.  You can use a warm compress on the area that is bothersome.  Follow-up with your primary care next week.  If anything worsens and you cannot open your jaw, fever,  difficulty chewing, swelling of your throat, shortness of breath, muffled voice you need to be seen immediately.  Your blood pressure is elevated.  Please monitor this at home.  Follow-up with your primary care this week for recheck.  If you develop any headache, chest pain, shortness of breath, vision change in the setting of high blood pressure you need to go to the ER.   ED Prescriptions     Medication Sig Dispense Auth. Provider   amoxicillin -clavulanate (AUGMENTIN) 500-125 MG tablet Take 1 tablet by mouth in the morning and at bedtime. 14 tablet Oree Mirelez K, PA-C      PDMP not reviewed this encounter.   Budd Cargo, PA-C 01/15/24 1204

## 2024-01-24 ENCOUNTER — Ambulatory Visit (INDEPENDENT_AMBULATORY_CARE_PROVIDER_SITE_OTHER): Admitting: Family

## 2024-01-24 ENCOUNTER — Encounter: Payer: Self-pay | Admitting: Family

## 2024-01-24 VITALS — BP 125/81 | HR 73 | Temp 98.0°F | Resp 16 | Ht 64.0 in | Wt 110.8 lb

## 2024-01-24 DIAGNOSIS — J3089 Other allergic rhinitis: Secondary | ICD-10-CM | POA: Diagnosis not present

## 2024-01-24 DIAGNOSIS — M26622 Arthralgia of left temporomandibular joint: Secondary | ICD-10-CM | POA: Diagnosis not present

## 2024-01-24 DIAGNOSIS — R7989 Other specified abnormal findings of blood chemistry: Secondary | ICD-10-CM | POA: Diagnosis not present

## 2024-01-24 DIAGNOSIS — K047 Periapical abscess without sinus: Secondary | ICD-10-CM

## 2024-01-24 DIAGNOSIS — I10 Essential (primary) hypertension: Secondary | ICD-10-CM

## 2024-01-24 MED ORDER — LORATADINE 5 MG PO TBDP: 1.0000 | ORAL_TABLET | Freq: Every day | ORAL | 0 refills | Status: DC | PRN

## 2024-01-24 MED ORDER — METOPROLOL SUCCINATE ER 50 MG PO TB24: 50.0000 mg | ORAL_TABLET | Freq: Every day | ORAL | 0 refills | Status: DC

## 2024-01-24 NOTE — Progress Notes (Signed)
 Went to urgent care for left knee pain and b/p was high, lower back pain

## 2024-01-24 NOTE — Progress Notes (Signed)
 Patient ID: Rebecca Estes, female    DOB: 1934-04-27  MRN: [id number]  CC: Urgent Care Follow-Up  Subjective: Rebecca Estes is a 89 y.o. female who presents for Urgent Care follow-up. She is accompanied by her husband.   Her concerns today include:  01/15/2024 [facility] at [facility]) per PA note:  Initial Impression / Assessment and Plan / UC Course  I have reviewed the triage vital signs and the nursing notes.   Pertinent labs & imaging results that were available during my care of the patient were reviewed by me and considered in my medical decision making (see chart for details).     Patient is well-appearing, afebrile, nontoxic, nontachycardic.  Unclear etiology of symptoms but she does have significant gingival swelling around the area that she is experiencing pain so will cover for dental infection particular given she had a deep cleaning within the past few weeks.  She was started on Augmentin  twice daily for 7 days with instruction to gargle with warm salt water for additional symptom relief.  Notification for dose adjustment based on metabolic panel from 06/07/2022 with creatinine of 0.74 and calculated creatinine clearance of 41.02 mL/min.  Recommend close follow-up with her primary care.  We discussed that TMJ could also be contributing to her symptoms and so she was encouraged to use warm compresses on this area as well as use over-the-counter Tylenol.  Unfortunately, we do not have imaging capabilities of the mandible in urgent care as we discussed that if her symptoms are not improving or if anything worsens she should follow-up with her primary care.  Strict return precautions given.   Her blood pressure is elevated but she denied any signs/symptoms of endorgan damage.  Recommend that she follow-up with her primary care within the week for medication adjustment.  She is to avoid NSAIDs, decongestants, caffeine, sodium.  Recommend that she  monitor her blood pressure at home and keep a log for evaluation of follow-up appointment.  If she has any worsening or changing symptoms she needs to be seen immediately including headache, chest pain, shortness of breath, visual disturbance in the setting of high blood pressure.  Strict return precautions given.  All questions answered to her satisfaction.  Discharge Instructions        We are treating you for a dental infection.  Take Augmentin  twice daily for 7 days.  Use Tylenol for pain.  You can use a warm compress on the area that is bothersome.  Follow-up with your primary care next week.  If anything worsens and you cannot open your jaw, fever, difficulty chewing, swelling of your throat, shortness of breath, muffled voice you need to be seen immediately.   Your blood pressure is elevated.  Please monitor this at home.  Follow-up with your primary care this week for recheck.  If you develop any headache, chest pain, shortness of breath, vision change in the setting of high blood pressure you need to go to the ER.  Today's office visit 01/24/2024: - Reports no longer having tooth pain since recent Urgent Care visit. Reports she completed prescribed antibiotic. - Doing well on Metoprolol  Succinate, no issues/concerns. She does not complain of red flag symptoms such as but not limited to chest pain, shortness of breath, worst headache of life, nausea/vomiting.  - Needs referral to Nephrology for elevated creatinine levels.  - Allergies. Would like to try an allergy medication to see if this helps.  - States lower  back pain and knee pain feeling better. She denies recent trauma/injury and red flag symptoms.  Patient Active Problem List   Diagnosis Date Noted   Pain in left knee 12/01/2021   Ceruminosis, right 10/04/2019   Back muscle spasm 02/15/2019   Muscle cramp, nocturnal 12/27/2018   Cough 10/12/2018   Hernia of abdominal wall 10/12/2018   Aortic atherosclerosis (HCC) 07/24/2018    Chronic pain of both shoulders 07/24/2018   BMI less than 19,adult 02/01/2018   Difficulty chewing due to dentures 02/01/2018   Thoracic ascending aortic aneurysm (HCC) 11/06/2017   Palpitations 10/05/2017   Seasonal allergies 09/18/2017   Essential hypertension 05/26/2016   Osteoporosis 05/26/2016   Subclinical hyperthyroidism 05/26/2016     Current Outpatient Medications on File Prior to Visit  Medication Sig Dispense Refill   alendronate  (FOSAMAX ) 70 MG tablet Take 1 tablet (70 mg total) by mouth once a week. Take with a full glass of water on an empty stomach. (Patient not taking: Reported on 10/23/2023) 12 tablet 3   amLODipine  (NORVASC ) 10 MG tablet TAKE 1 TABLET EVERY DAY (Patient not taking: Reported on 10/23/2023) 90 tablet 1   amoxicillin -clavulanate (AUGMENTIN ) 500-125 MG tablet Take 1 tablet by mouth in the morning and at bedtime. (Patient not taking: Reported on 01/24/2024) 14 tablet 0   aspirin EC 325 MG tablet Take 325 mg by mouth every morning.  (Patient not taking: Reported on 10/23/2023)     Cholecalciferol (VITAMIN D3) 25 MCG (1000 UT) CAPS Vitamin D3 1,000 unit capsule  Take 1 capsule every day by oral route. (Patient not taking: Reported on 10/23/2023) 60 capsule 2   diclofenac  Sodium (VOLTAREN ) 1 % GEL Apply 2 g topically 4 (four) times daily. (Patient not taking: Reported on 10/23/2023) 150 g 0   docusate sodium  (COLACE) 100 MG capsule Take 1 capsule (100 mg total) by mouth every 12 (twelve) hours. (Patient not taking: Reported on 10/23/2023) 60 capsule 0   glycerin  adult 2 g suppository Place 1 suppository rectally as needed for constipation. (Patient not taking: Reported on 10/23/2023) 12 suppository 0   hydrochlorothiazide  (HYDRODIURIL ) 50 MG tablet TAKE 1 TABLET EVERY DAY (Patient not taking: Reported on 10/23/2023) 90 tablet 1   loperamide  (IMODIUM ) 2 MG capsule Take 1 capsule (2 mg total) by mouth 2 (two) times daily as needed for diarrhea or loose stools. (Patient not  taking: Reported on 10/23/2023) 14 capsule 0   memantine  (NAMENDA ) 10 MG tablet Take 1 tablet (10 mg total) by mouth 2 (two) times daily. (Patient not taking: Reported on 10/23/2023) 60 tablet 12   ondansetron  (ZOFRAN -ODT) 4 MG disintegrating tablet Take 1 tablet (4 mg total) by mouth every 8 (eight) hours as needed for nausea or vomiting. (Patient not taking: Reported on 10/23/2023) 20 tablet 0   pravastatin  (PRAVACHOL ) 40 MG tablet Take 1 tablet (40 mg total) by mouth every evening. (Patient not taking: Reported on 10/23/2023) 30 tablet 2   No current facility-administered medications on file prior to visit.    No Known Allergies  Social History   Socioeconomic History   Marital status: Married    Spouse name: Not on file   Number of children: 5   Years of education: 9   Highest education level: Not on file  Occupational History   Not on file  Tobacco Use   Smoking status: Never    Passive exposure: Never   Smokeless tobacco: Never  Vaping Use   Vaping status: Never Used  Substance and  Sexual Activity   Alcohol use: No   Drug use: No   Sexual activity: Not Currently    Partners: Male  Other Topics Concern   Not on file  Social History Narrative   Denies abuse and feels safe at home.    Social Drivers of Corporate investment banker Strain: Low Risk  (01/24/2024)   Overall Financial Resource Strain (CARDIA)    Difficulty of Paying Living Expenses: Not hard at all  Food Insecurity: No Food Insecurity (01/24/2024)   Hunger Vital Sign    Worried About Running Out of Food in the Last Year: Never true    Ran Out of Food in the Last Year: Never true  Transportation Needs: No Transportation Needs (01/24/2024)   PRAPARE - Administrator, Civil Service (Medical): No    Lack of Transportation (Non-Medical): No  Physical Activity: Insufficiently Active (01/24/2024)   Exercise Vital Sign    Days of Exercise per Week: 3 days    Minutes of Exercise per Session: 40 min   Stress: No Stress Concern Present (01/24/2024)   [employer] of Occupational Health - Occupational Stress Questionnaire    Feeling of Stress: Not at all  Social Connections: Moderately Integrated (01/24/2024)   Social Connection and Isolation Panel    Frequency of Communication with Friends and Family: More than three times a week    Frequency of Social Gatherings with Friends and Family: More than three times a week    Attends Religious Services: More than 4 times per year    Active Member of Clubs or Organizations: No    Attends Banker Meetings: Never    Marital Status: Married  Catering manager Violence: Not At Risk (01/24/2024)   Humiliation, Afraid, Rape, and Kick questionnaire    Fear of Current or Ex-Partner: No    Emotionally Abused: No    Physically Abused: No    Sexually Abused: No    Family History  Problem Relation Age of Onset   Aneurysm Father 51       Brain   Other Mother 84       Blood Infection   Coronary artery disease Brother 4   Stroke Brother 40   Aneurysm Sister 68       Brain   Cervical cancer Sister 35   Kidney failure Sister 104       was on Dialysis   Aneurysm Sister        Brain    Past Surgical History:  Procedure Laterality Date   ANTERIOR CERVICAL DECOMP/DISCECTOMY FUSION     x3   BREAST BIOPSY Left 02/10/2009   NECK SURGERY     ROTATOR CUFF REPAIR     Left   TUBAL LIGATION     UMBILICAL HERNIA REPAIR      ROS: Review of Systems Negative except as stated above  PHYSICAL EXAM: BP 125/81   Pulse 73   Temp 98 F (36.7 C) (Oral)   Resp 16   Ht 5' 4 (1.626 m)   Wt 110 lb 12.8 oz (50.3 kg)   SpO2 98%   BMI 19.02 kg/m   Physical Exam HENT:     Head: Normocephalic and atraumatic.     Nose: Nose normal.     Mouth/Throat:     Mouth: Mucous membranes are moist.     Pharynx: Oropharynx is clear.   Eyes:     Extraocular Movements: Extraocular movements intact.     Conjunctiva/sclera: Conjunctivae  normal.      Pupils: Pupils are equal, round, and reactive to light.    Cardiovascular:     Rate and Rhythm: Normal rate and regular rhythm.     Pulses: Normal pulses.     Heart sounds: Normal heart sounds.  Pulmonary:     Effort: Pulmonary effort is normal.     Breath sounds: Normal breath sounds.   Musculoskeletal:        General: Normal range of motion.     Cervical back: Normal range of motion and neck supple.   Neurological:     General: No focal deficit present.     Mental Status: She is alert and oriented to person, place, and time.   Psychiatric:        Mood and Affect: Mood normal.        Behavior: Behavior normal.     ASSESSMENT AND PLAN: 1. Dental infection (Primary) 2. Arthralgia of left temporomandibular joint - Resolved. Patient reports feeling back to normal/baseline.   3. Primary hypertension - Continue Metoprolol  Succinate as prescribed.  - Counseled on blood pressure goal of less than 130/80, low-sodium, DASH diet, medication compliance, and 150 minutes of moderate intensity exercise per week as tolerated. Counseled on medication adherence and adverse effects. - Follow-up with primary provider in 3 months or sooner if needed. - metoprolol  succinate (TOPROL  XL) 50 MG 24 hr tablet; Take 1 tablet (50 mg total) by mouth daily. Take with or immediately following a meal.  Dispense: 90 tablet; Refill: 0  4. Elevated serum creatinine - Referral to Nephrology for evaluation/management. - Ambulatory referral to Nephrology  5. Perennial allergic rhinitis - Loratadine  as prescribed. Counseled on medication adherence/adverse effects. - Follow-up with primary provider in 4 weeks or sooner if needed. - Loratadine  5 MG TBDP; Take 1 tablet (5 mg total) by mouth daily as needed.  Dispense: 90 tablet; Refill: 0   Patient was given the opportunity to ask questions.  Patient verbalized understanding of the plan and was able to repeat key elements of the plan. Patient was given clear  instructions to go to Emergency Department or return to medical center if symptoms don't improve, worsen, or new problems develop.The patient verbalized understanding.   Orders Placed This Encounter  Procedures   Ambulatory referral to Nephrology     Requested Prescriptions   Signed Prescriptions Disp Refills   metoprolol  succinate (TOPROL  XL) 50 MG 24 hr tablet 90 tablet 0    Sig: Take 1 tablet (50 mg total) by mouth daily. Take with or immediately following a meal.   Loratadine  5 MG TBDP 90 tablet 0    Sig: Take 1 tablet (5 mg total) by mouth daily as needed.    Follow-up with primary provider as scheduled.  Senaida Dama, NP

## 2024-02-05 ENCOUNTER — Ambulatory Visit
Admission: EM | Admit: 2024-02-05 | Discharge: 2024-02-05 | Disposition: A | Discharge status: Home / Self Care | Attending: Physician Assistant | Admitting: Physician Assistant

## 2024-02-05 DIAGNOSIS — L299 Pruritus, unspecified: Secondary | ICD-10-CM

## 2024-02-05 MED ORDER — TRIAMCINOLONE ACETONIDE 0.1 % EX CREA: 1.0000 | TOPICAL_CREAM | Freq: Two times a day (BID) | CUTANEOUS | 0 refills | Status: AC

## 2024-02-05 NOTE — ED Triage Notes (Signed)
 I have some itching from maybe an insect bite on the back of my lower right leg. No fever.

## 2024-02-05 NOTE — ED Provider Notes (Signed)
 [facility]    CSN: [id number] Arrival date & time: 02/05/24  1055      History   Chief Complaint Chief Complaint  Patient presents with   Insect Bite    HPI Rebecca Estes is a 89 y.o. female.   Patient presents today for evaluation of possible insect bite to the back of her right lower leg.  She reports she has had itching in this area.  She denies any other symptoms.  The history is provided by the patient.    Past Medical History:  Diagnosis Date   Arrhythmia    Bleeding ulcer    Cancer (HCC)    Hiatal hernia    Hypertension    Hyperthyroidism    Osteoporosis    Thoracic ascending aortic aneurysm (HCC) 11/06/2017   41mm diagnosed by echo 10/24/17.    Patient Active Problem List   Diagnosis Date Noted   Pain in left knee 12/01/2021   Ceruminosis, right 10/04/2019   Back muscle spasm 02/15/2019   Muscle cramp, nocturnal 12/27/2018   Cough 10/12/2018   Hernia of abdominal wall 10/12/2018   Aortic atherosclerosis (HCC) 07/24/2018   Chronic pain of both shoulders 07/24/2018   BMI less than 19,adult 02/01/2018   Difficulty chewing due to dentures 02/01/2018   Thoracic ascending aortic aneurysm (HCC) 11/06/2017   Palpitations 10/05/2017   Seasonal allergies 09/18/2017   Essential hypertension 05/26/2016   Osteoporosis 05/26/2016   Subclinical hyperthyroidism 05/26/2016    Past Surgical History:  Procedure Laterality Date   ANTERIOR CERVICAL DECOMP/DISCECTOMY FUSION     x3   BREAST BIOPSY Left 02/10/2009   NECK SURGERY     ROTATOR CUFF REPAIR     Left   TUBAL LIGATION     UMBILICAL HERNIA REPAIR      OB History   No obstetric history on file.      Home Medications    Prior to Admission medications   Medication Sig Start Date End Date Taking? Authorizing Provider  triamcinolone cream (KENALOG) 0.1 % Apply 1 Application topically 2 (two) times daily. 02/05/24  Yes Billy Asberry FALCON, PA-C  alendronate  (FOSAMAX ) 70 MG tablet Take 1  tablet (70 mg total) by mouth once a week. Take with a full glass of water on an empty stomach. Patient not taking: Reported on 10/23/2023 10/08/19   Rosan Dayton BROCKS, DO  amLODipine  (NORVASC ) 10 MG tablet TAKE 1 TABLET EVERY DAY Patient not taking: Reported on 10/23/2023 08/24/22   Lorren Greig PARAS, NP  amoxicillin -clavulanate (AUGMENTIN ) 500-125 MG tablet Take 1 tablet by mouth in the morning and at bedtime. Patient not taking: Reported on 01/24/2024 01/15/24   Raspet, Erin K, PA-C  aspirin EC 325 MG tablet Take 325 mg by mouth every morning.  Patient not taking: Reported on 10/23/2023    [provider]  Cholecalciferol (VITAMIN D3) 25 MCG (1000 UT) CAPS Vitamin D3 1,000 unit capsule  Take 1 capsule every day by oral route. Patient not taking: Reported on 10/23/2023 03/06/19   Aslam, Sadia, MD  diclofenac  Sodium (VOLTAREN ) 1 % GEL Apply 2 g topically 4 (four) times daily. Patient not taking: Reported on 10/23/2023 10/20/20   Wieters, Hallie C, PA-C  docusate sodium  (COLACE) 100 MG capsule Take 1 capsule (100 mg total) by mouth every 12 (twelve) hours. Patient not taking: Reported on 10/23/2023 07/13/23   Vear, Amy L, PA  glycerin  adult 2 g suppository Place 1 suppository rectally as needed for constipation. Patient not taking:  Reported on 10/23/2023 07/13/23   Vear, Amy L, PA  hydrochlorothiazide  (HYDRODIURIL ) 50 MG tablet TAKE 1 TABLET EVERY DAY Patient not taking: Reported on 10/23/2023 08/24/22   Lorren Greig PARAS, NP  loperamide  (IMODIUM ) 2 MG capsule Take 1 capsule (2 mg total) by mouth 2 (two) times daily as needed for diarrhea or loose stools. Patient not taking: Reported on 10/23/2023 06/26/23   Christopher Savannah, PA-C  Loratadine  5 MG TBDP Take 1 tablet (5 mg total) by mouth daily as needed. 01/24/24   Lorren Greig PARAS, NP  memantine  (NAMENDA ) 10 MG tablet Take 1 tablet (10 mg total) by mouth 2 (two) times daily. Patient not taking: Reported on 10/23/2023 05/09/22   Penumalli, Eduard SAUNDERS, MD  metoprolol   succinate (TOPROL  XL) 50 MG 24 hr tablet Take 1 tablet (50 mg total) by mouth daily. Take with or immediately following a meal. 01/24/24 01/23/25  Lorren Greig PARAS, NP  ondansetron  (ZOFRAN -ODT) 4 MG disintegrating tablet Take 1 tablet (4 mg total) by mouth every 8 (eight) hours as needed for nausea or vomiting. Patient not taking: Reported on 10/23/2023 06/26/23   Christopher Savannah, PA-C  pravastatin  (PRAVACHOL ) 40 MG tablet Take 1 tablet (40 mg total) by mouth every evening. Patient not taking: Reported on 10/23/2023 09/19/22 12/18/22  Lorren Greig PARAS, NP    Family History Family History  Problem Relation Age of Onset   Aneurysm Father 23       Brain   Other Mother 43       Blood Infection   Coronary artery disease Brother 68   Stroke Brother 32   Aneurysm Sister 52       Brain   Cervical cancer Sister 31   Kidney failure Sister 85       was on Dialysis   Aneurysm Sister        Brain    Social History Social History   Tobacco Use   Smoking status: Never    Passive exposure: Never   Smokeless tobacco: Never  Vaping Use   Vaping status: Never Used  Substance Use Topics   Alcohol use: No   Drug use: No     Allergies   Patient has no known allergies.   Review of Systems Review of Systems  Constitutional:  Negative for chills and fever.  Eyes:  Negative for discharge and redness.  Cardiovascular:  Negative for leg swelling.  Gastrointestinal:  Negative for abdominal pain, nausea and vomiting.  Skin:  Negative for color change and wound.     Physical Exam Triage Vital Signs ED Triage Vitals  Encounter Vitals Group     BP 02/05/24 1107 (!) 145/89     Girls Systolic BP Percentile --      Girls Diastolic BP Percentile --      Boys Systolic BP Percentile --      Boys Diastolic BP Percentile --      Pulse Rate 02/05/24 1107 81     Resp 02/05/24 1107 18     Temp 02/05/24 1107 98.7 F (37.1 C)     Temp Source 02/05/24 1107 Oral     SpO2 02/05/24 1107 96 %     Weight  02/05/24 1105 110 lb 14.3 oz (50.3 kg)     Height 02/05/24 1105 5' 4 (1.626 m)     Head Circumference --      Peak Flow --      Pain Score 02/05/24 1104 2     Pain Loc --  Pain Education --      Exclude from Growth Chart --    No data found.  Updated Vital Signs BP (!) 145/89 (BP Location: Left Arm)   Pulse 81   Temp 98.7 F (37.1 C) (Oral)   Resp 18   Ht 5' 4 (1.626 m)   Wt 110 lb 14.3 oz (50.3 kg)   SpO2 96%   BMI 19.03 kg/m   Visual Acuity Right Eye Distance:   Left Eye Distance:   Bilateral Distance:    Right Eye Near:   Left Eye Near:    Bilateral Near:     Physical Exam Vitals and nursing note reviewed.  Constitutional:      General: She is not in acute distress.    Appearance: Normal appearance. She is not ill-appearing.  HENT:     Head: Normocephalic and atraumatic.   Eyes:     Conjunctiva/sclera: Conjunctivae normal.    Cardiovascular:     Rate and Rhythm: Normal rate.  Pulmonary:     Effort: Pulmonary effort is normal. No respiratory distress.   Skin:    Comments: No skin lesion appreciated to right lower leg, no swelling or erythema   Neurological:     Mental Status: She is alert.   Psychiatric:        Mood and Affect: Mood normal.        Behavior: Behavior normal.        Thought Content: Thought content normal.      UC Treatments / Results  Labs (all labs ordered are listed, but only abnormal results are displayed) Labs Reviewed - No data to display  EKG   Radiology No results found.  Procedures Procedures (including critical care time)  Medications Ordered in UC Medications - No data to display  Initial Impression / Assessment and Plan / UC Course  I have reviewed the triage vital signs and the nursing notes.  Pertinent labs & imaging results that were available during my care of the patient were reviewed by me and considered in my medical decision making (see chart for details).    Unclear etiology of patient's  itching but will treat with steroid cream.  Advised follow-up if no gradual improvement with any further concerns.  Final Clinical Impressions(s) / UC Diagnoses   Final diagnoses:  Itching   Discharge Instructions   None    ED Prescriptions     Medication Sig Dispense Auth. Provider   triamcinolone cream (KENALOG) 0.1 % Apply 1 Application topically 2 (two) times daily. 30 g Billy Asberry FALCON, PA-C      PDMP not reviewed this encounter.   Billy Asberry FALCON, PA-C 02/05/24 1901

## 2024-03-08 ENCOUNTER — Ambulatory Visit
Admission: EM | Admit: 2024-03-08 | Discharge: 2024-03-08 | Disposition: A | Discharge status: Home / Self Care | Attending: Physician Assistant | Admitting: Physician Assistant

## 2024-03-08 DIAGNOSIS — M25462 Effusion, left knee: Secondary | ICD-10-CM

## 2024-03-08 DIAGNOSIS — M25562 Pain in left knee: Secondary | ICD-10-CM

## 2024-03-08 MED ORDER — PREDNISONE 20 MG PO TABS: 40.0000 mg | ORAL_TABLET | Freq: Every day | ORAL | 0 refills | Status: AC

## 2024-03-08 NOTE — ED Provider Notes (Signed)
 [facility]    CSN: [id number] Arrival date & time: 03/08/24  1315      History   Chief Complaint Chief Complaint  Patient presents with   Knee Problem    HPI Rebecca Estes is a 89 y.o. female.   Patient here today for evaluation of swelling and fluid on her left knee.  She reports that she does see her PCP for same but it seemed a little more swollen than it should be.  She denies any recent injury.  She has not had any fevers.  The history is provided by the patient.    Past Medical History:  Diagnosis Date   Arrhythmia    Bleeding ulcer    Cancer (HCC)    Hiatal hernia    Hypertension    Hyperthyroidism    Osteoporosis    Thoracic ascending aortic aneurysm (HCC) 11/06/2017   41mm diagnosed by echo 10/24/17.    Patient Active Problem List   Diagnosis Date Noted   Pain in left knee 12/01/2021   Ceruminosis, right 10/04/2019   Back muscle spasm 02/15/2019   Muscle cramp, nocturnal 12/27/2018   Cough 10/12/2018   Hernia of abdominal wall 10/12/2018   Aortic atherosclerosis (HCC) 07/24/2018   Chronic pain of both shoulders 07/24/2018   BMI less than 19,adult 02/01/2018   Difficulty chewing due to dentures 02/01/2018   Thoracic ascending aortic aneurysm (HCC) 11/06/2017   Palpitations 10/05/2017   Seasonal allergies 09/18/2017   Essential hypertension 05/26/2016   Osteoporosis 05/26/2016   Subclinical hyperthyroidism 05/26/2016    Past Surgical History:  Procedure Laterality Date   ANTERIOR CERVICAL DECOMP/DISCECTOMY FUSION     x3   BREAST BIOPSY Left 02/10/2009   NECK SURGERY     ROTATOR CUFF REPAIR     Left   TUBAL LIGATION     UMBILICAL HERNIA REPAIR      OB History   No obstetric history on file.      Home Medications    Prior to Admission medications   Medication Sig Start Date End Date Taking? Authorizing Provider  predniSONE  (DELTASONE ) 20 MG tablet Take 2 tablets (40 mg total) by mouth daily with breakfast for 5 days.  03/08/24 03/13/24 Yes Billy Asberry FALCON, PA-C  alendronate  (FOSAMAX ) 70 MG tablet Take 1 tablet (70 mg total) by mouth once a week. Take with a full glass of water on an empty stomach. Patient not taking: Reported on 10/23/2023 10/08/19   Rosan Dayton BROCKS, DO  amLODipine  (NORVASC ) 10 MG tablet TAKE 1 TABLET EVERY DAY Patient not taking: Reported on 10/23/2023 08/24/22   Lorren Greig PARAS, NP  amoxicillin -clavulanate (AUGMENTIN ) 500-125 MG tablet Take 1 tablet by mouth in the morning and at bedtime. Patient not taking: Reported on 01/24/2024 01/15/24   Raspet, Erin K, PA-C  aspirin EC 325 MG tablet Take 325 mg by mouth every morning.  Patient not taking: Reported on 10/23/2023    [provider]  Cholecalciferol (VITAMIN D3) 25 MCG (1000 UT) CAPS Vitamin D3 1,000 unit capsule  Take 1 capsule every day by oral route. Patient not taking: Reported on 10/23/2023 03/06/19   Aslam, Sadia, MD  diclofenac  Sodium (VOLTAREN ) 1 % GEL Apply 2 g topically 4 (four) times daily. Patient not taking: Reported on 10/23/2023 10/20/20   Wieters, Hallie C, PA-C  docusate sodium  (COLACE) 100 MG capsule Take 1 capsule (100 mg total) by mouth every 12 (twelve) hours. Patient not taking: Reported on 10/23/2023 07/13/23  Vear, Amy L, PA  glycerin  adult 2 g suppository Place 1 suppository rectally as needed for constipation. Patient not taking: Reported on 10/23/2023 07/13/23   Vear, Amy L, PA  hydrochlorothiazide  (HYDRODIURIL ) 50 MG tablet TAKE 1 TABLET EVERY DAY Patient not taking: Reported on 10/23/2023 08/24/22   Lorren Greig PARAS, NP  loperamide  (IMODIUM ) 2 MG capsule Take 1 capsule (2 mg total) by mouth 2 (two) times daily as needed for diarrhea or loose stools. Patient not taking: Reported on 10/23/2023 06/26/23   Christopher Savannah, PA-C  Loratadine  5 MG TBDP Take 1 tablet (5 mg total) by mouth daily as needed. 01/24/24   Lorren Greig PARAS, NP  memantine  (NAMENDA ) 10 MG tablet Take 1 tablet (10 mg total) by mouth 2 (two) times  daily. Patient not taking: Reported on 10/23/2023 05/09/22   Penumalli, Vikram R, MD  metoprolol  succinate (TOPROL  XL) 50 MG 24 hr tablet Take 1 tablet (50 mg total) by mouth daily. Take with or immediately following a meal. 01/24/24 01/23/25  Lorren Greig PARAS, NP  ondansetron  (ZOFRAN -ODT) 4 MG disintegrating tablet Take 1 tablet (4 mg total) by mouth every 8 (eight) hours as needed for nausea or vomiting. Patient not taking: Reported on 10/23/2023 06/26/23   Christopher Savannah, PA-C  pravastatin  (PRAVACHOL ) 40 MG tablet Take 1 tablet (40 mg total) by mouth every evening. Patient not taking: Reported on 10/23/2023 09/19/22 12/18/22  Lorren Greig PARAS, NP  triamcinolone  cream (KENALOG ) 0.1 % Apply 1 Application topically 2 (two) times daily. 02/05/24   Billy Asberry FALCON, PA-C    Family History Family History  Problem Relation Age of Onset   Aneurysm Father 30       Brain   Other Mother 35       Blood Infection   Coronary artery disease Brother 69   Stroke Brother 27   Aneurysm Sister 63       Brain   Cervical cancer Sister 23   Kidney failure Sister 41       was on Dialysis   Aneurysm Sister        Brain    Social History Social History   Tobacco Use   Smoking status: Never    Passive exposure: Never   Smokeless tobacco: Never  Vaping Use   Vaping status: Never Used  Substance Use Topics   Alcohol use: No   Drug use: No     Allergies   Patient has no known allergies.   Review of Systems Review of Systems  Constitutional:  Negative for chills and fever.  Eyes:  Negative for discharge and redness.  Respiratory:  Negative for shortness of breath.   Gastrointestinal:  Negative for abdominal pain, nausea and vomiting.  Musculoskeletal:  Positive for arthralgias and joint swelling.     Physical Exam Triage Vital Signs ED Triage Vitals  Encounter Vitals Group     BP 03/08/24 1329 (!) 153/89     Girls Systolic BP Percentile --      Girls Diastolic BP Percentile --      Boys  Systolic BP Percentile --      Boys Diastolic BP Percentile --      Pulse Rate 03/08/24 1329 69     Resp 03/08/24 1329 16     Temp 03/08/24 1329 98.1 F (36.7 C)     Temp Source 03/08/24 1329 Oral     SpO2 03/08/24 1329 96 %     Weight 03/08/24 1328 110 lb 14.3 oz (50.3  kg)     Height 03/08/24 1328 5' 5 (1.651 m)     Head Circumference --      Peak Flow --      Pain Score 03/08/24 1325 0     Pain Loc --      Pain Education --      Exclude from Growth Chart --    No data found.  Updated Vital Signs BP (!) 153/89 (BP Location: Left Arm)   Pulse 69   Temp 98.1 F (36.7 C) (Oral)   Resp 16   Ht 5' 5 (1.651 m)   Wt 110 lb 14.3 oz (50.3 kg)   SpO2 96%   BMI 18.45 kg/m   Visual Acuity Right Eye Distance:   Left Eye Distance:   Bilateral Distance:    Right Eye Near:   Left Eye Near:    Bilateral Near:     Physical Exam Vitals and nursing note reviewed.  Constitutional:      General: She is not in acute distress.    Appearance: Normal appearance. She is not ill-appearing.  HENT:     Head: Normocephalic and atraumatic.  Eyes:     Conjunctiva/sclera: Conjunctivae normal.  Cardiovascular:     Rate and Rhythm: Normal rate.  Pulmonary:     Effort: Pulmonary effort is normal.  Musculoskeletal:     Comments: Mildly decreased range of motion of left knee with mild diffuse swelling.  No tenderness to palpation noted.  No erythema or warmth.  No swelling to left lower leg.  Neurological:     Mental Status: She is alert.  Psychiatric:        Mood and Affect: Mood normal.        Behavior: Behavior normal.        Thought Content: Thought content normal.      UC Treatments / Results  Labs (all labs ordered are listed, but only abnormal results are displayed) Labs Reviewed - No data to display  EKG   Radiology No results found.  Procedures Procedures (including critical care time)  Medications Ordered in UC Medications - No data to display  Initial  Impression / Assessment and Plan / UC Course  I have reviewed the triage vital signs and the nursing notes.  Pertinent labs & imaging results that were available during my care of the patient were reviewed by me and considered in my medical decision making (see chart for details).    Suspect likely inflammatory cause of pain, possibly due to aging and arthritis.  Will treat with steroid burst and advised follow-up with Ortho should symptoms not improve or worsen.  Patient expressed understanding.  Final Clinical Impressions(s) / UC Diagnoses   Final diagnoses:  Pain and swelling of left knee   Discharge Instructions   None    ED Prescriptions     Medication Sig Dispense Auth. Provider   predniSONE  (DELTASONE ) 20 MG tablet Take 2 tablets (40 mg total) by mouth daily with breakfast for 5 days. 10 tablet Billy Asberry FALCON, PA-C      PDMP not reviewed this encounter.   Billy Asberry FALCON, PA-C 03/10/24 1246

## 2024-03-08 NOTE — ED Triage Notes (Signed)
 I am seeing some swelling/fluid on my left knee. I see Dr. Greig for this or have been but it is more swollen than it should be. No injury (recent) known.

## 2024-03-26 ENCOUNTER — Ambulatory Visit (INDEPENDENT_AMBULATORY_CARE_PROVIDER_SITE_OTHER): Payer: Self-pay | Admitting: Family

## 2024-03-26 ENCOUNTER — Encounter: Payer: Self-pay | Admitting: Family

## 2024-03-26 VITALS — BP 117/77 | HR 58 | Temp 97.5°F | Resp 16 | Ht 65.0 in | Wt 102.0 lb

## 2024-03-26 DIAGNOSIS — M25562 Pain in left knee: Secondary | ICD-10-CM | POA: Diagnosis not present

## 2024-03-26 DIAGNOSIS — Z13220 Encounter for screening for lipoid disorders: Secondary | ICD-10-CM

## 2024-03-26 DIAGNOSIS — I10 Essential (primary) hypertension: Secondary | ICD-10-CM | POA: Diagnosis not present

## 2024-03-26 DIAGNOSIS — J3089 Other allergic rhinitis: Secondary | ICD-10-CM | POA: Diagnosis not present

## 2024-03-26 DIAGNOSIS — M25462 Effusion, left knee: Secondary | ICD-10-CM

## 2024-03-26 MED ORDER — METOPROLOL SUCCINATE ER 50 MG PO TB24: 50.0000 mg | ORAL_TABLET | Freq: Every day | ORAL | 0 refills | Status: AC

## 2024-03-26 MED ORDER — LORATADINE 5 MG PO TBDP: 1.0000 | ORAL_TABLET | Freq: Every day | ORAL | 0 refills | Status: AC | PRN

## 2024-03-26 NOTE — Progress Notes (Signed)
 2 month follow, patient is having knee pain, patient needs something for allergies

## 2024-03-26 NOTE — Progress Notes (Addendum)
 Patient ID: Rebecca Estes, female    DOB: 12/15/1933  MRN: [id number]  CC: Chronic Conditions Follow-Up  Subjective: Rebecca Estes is a 89 y.o. female who presents for chronic conditions follow-up. She is accompanied by her husband.  Her concerns today include:  - Doing well on Metoprolol  Succinate, no issues/concerns. She does not complain of red flag symptoms such as but not limited to chest pain, shortness of breath, worst headache of life, nausea/vomiting.  - Cholesterol lab.  - Left knee pain persisting. Denies recent trauma/injury and red flag symptoms. She was recently seen at Urgent Care and prescribed Prednisone  which provided minimal relief.  - Doing well on Loratadine , no issues/concerns.   Patient Active Problem List   Diagnosis Date Noted   Pain in left knee 12/01/2021   Ceruminosis, right 10/04/2019   Back muscle spasm 02/15/2019   Muscle cramp, nocturnal 12/27/2018   Cough 10/12/2018   Hernia of abdominal wall 10/12/2018   Aortic atherosclerosis (HCC) 07/24/2018   Chronic pain of both shoulders 07/24/2018   BMI less than 19,adult 02/01/2018   Difficulty chewing due to dentures 02/01/2018   Thoracic ascending aortic aneurysm (HCC) 11/06/2017   Palpitations 10/05/2017   Seasonal allergies 09/18/2017   Essential hypertension 05/26/2016   Osteoporosis 05/26/2016   Subclinical hyperthyroidism 05/26/2016     Current Outpatient Medications on File Prior to Visit  Medication Sig Dispense Refill   triamcinolone  cream (KENALOG ) 0.1 % Apply 1 Application topically 2 (two) times daily. 30 g 0   alendronate  (FOSAMAX ) 70 MG tablet Take 1 tablet (70 mg total) by mouth once a week. Take with a full glass of water on an empty stomach. (Patient not taking: Reported on 10/23/2023) 12 tablet 3   amLODipine  (NORVASC ) 10 MG tablet TAKE 1 TABLET EVERY DAY (Patient not taking: Reported on 10/23/2023) 90 tablet 1   amoxicillin -clavulanate (AUGMENTIN ) 500-125 MG tablet Take 1  tablet by mouth in the morning and at bedtime. (Patient not taking: Reported on 01/24/2024) 14 tablet 0   aspirin EC 325 MG tablet Take 325 mg by mouth every morning.  (Patient not taking: Reported on 10/23/2023)     Cholecalciferol (VITAMIN D3) 25 MCG (1000 UT) CAPS Vitamin D3 1,000 unit capsule  Take 1 capsule every day by oral route. (Patient not taking: Reported on 10/23/2023) 60 capsule 2   diclofenac  Sodium (VOLTAREN ) 1 % GEL Apply 2 g topically 4 (four) times daily. (Patient not taking: Reported on 10/23/2023) 150 g 0   docusate sodium  (COLACE) 100 MG capsule Take 1 capsule (100 mg total) by mouth every 12 (twelve) hours. (Patient not taking: Reported on 10/23/2023) 60 capsule 0   glycerin  adult 2 g suppository Place 1 suppository rectally as needed for constipation. (Patient not taking: Reported on 10/23/2023) 12 suppository 0   hydrochlorothiazide  (HYDRODIURIL ) 50 MG tablet TAKE 1 TABLET EVERY DAY (Patient not taking: Reported on 10/23/2023) 90 tablet 1   loperamide  (IMODIUM ) 2 MG capsule Take 1 capsule (2 mg total) by mouth 2 (two) times daily as needed for diarrhea or loose stools. (Patient not taking: Reported on 10/23/2023) 14 capsule 0   memantine  (NAMENDA ) 10 MG tablet Take 1 tablet (10 mg total) by mouth 2 (two) times daily. (Patient not taking: Reported on 10/23/2023) 60 tablet 12   ondansetron  (ZOFRAN -ODT) 4 MG disintegrating tablet Take 1 tablet (4 mg total) by mouth every 8 (eight) hours as needed for nausea or vomiting. (Patient not taking: Reported on 10/23/2023) 20 tablet 0  pravastatin  (PRAVACHOL ) 40 MG tablet Take 1 tablet (40 mg total) by mouth every evening. (Patient not taking: Reported on 10/23/2023) 30 tablet 2   No current facility-administered medications on file prior to visit.    No Known Allergies  Social History   Socioeconomic History   Marital status: Married    Spouse name: Not on file   Number of children: 5   Years of education: 9   Highest education level: Not  on file  Occupational History   Not on file  Tobacco Use   Smoking status: Never    Passive exposure: Never   Smokeless tobacco: Never  Vaping Use   Vaping status: Never Used  Substance and Sexual Activity   Alcohol use: No   Drug use: No   Sexual activity: Not Currently    Partners: Male  Other Topics Concern   Not on file  Social History Narrative   Denies abuse and feels safe at home.    Social Drivers of Corporate investment banker Strain: Low Risk  (01/24/2024)   Overall Financial Resource Strain (CARDIA)    Difficulty of Paying Living Expenses: Not hard at all  Food Insecurity: No Food Insecurity (01/24/2024)   Hunger Vital Sign    Worried About Running Out of Food in the Last Year: Never true    Ran Out of Food in the Last Year: Never true  Transportation Needs: No Transportation Needs (01/24/2024)   PRAPARE - Administrator, Civil Service (Medical): No    Lack of Transportation (Non-Medical): No  Physical Activity: Insufficiently Active (01/24/2024)   Exercise Vital Sign    Days of Exercise per Week: 3 days    Minutes of Exercise per Session: 40 min  Stress: No Stress Concern Present (01/24/2024)   [employer] of Occupational Health - Occupational Stress Questionnaire    Feeling of Stress: Not at all  Social Connections: Moderately Integrated (01/24/2024)   Social Connection and Isolation Panel    Frequency of Communication with Friends and Family: More than three times a week    Frequency of Social Gatherings with Friends and Family: More than three times a week    Attends Religious Services: More than 4 times per year    Active Member of [employer] or Organizations: No    Attends Banker Meetings: Never    Marital Status: Married  Catering manager Violence: Not At Risk (01/24/2024)   Humiliation, Afraid, Rape, and Kick questionnaire    Fear of Current or Ex-Partner: No    Emotionally Abused: No    Physically Abused: No    Sexually  Abused: No    Family History  Problem Relation Age of Onset   Aneurysm Father 48       Brain   Other Mother 36       Blood Infection   Coronary artery disease Brother 71   Stroke Brother 42   Aneurysm Sister 80       Brain   Cervical cancer Sister 42   Kidney failure Sister 46       was on Dialysis   Aneurysm Sister        Brain    Past Surgical History:  Procedure Laterality Date   ANTERIOR CERVICAL DECOMP/DISCECTOMY FUSION     x3   BREAST BIOPSY Left 02/10/2009   NECK SURGERY     ROTATOR CUFF REPAIR     Left   TUBAL LIGATION     UMBILICAL HERNIA  REPAIR      ROS: Review of Systems Negative except as stated above  PHYSICAL EXAM: BP 117/77   Pulse (!) 58   Temp (!) 97.5 F (36.4 C) (Oral)   Resp 16   Ht 5' 5 (1.651 m)   Wt 102 lb (46.3 kg)   SpO2 96%   BMI 16.97 kg/m   Physical Exam HENT:     Head: Normocephalic and atraumatic.     Nose: Nose normal.     Mouth/Throat:     Mouth: Mucous membranes are moist.     Pharynx: Oropharynx is clear.  Eyes:     Extraocular Movements: Extraocular movements intact.     Conjunctiva/sclera: Conjunctivae normal.     Pupils: Pupils are equal, round, and reactive to light.  Cardiovascular:     Rate and Rhythm: Bradycardia present.     Pulses: Normal pulses.     Heart sounds: Normal heart sounds.  Pulmonary:     Effort: Pulmonary effort is normal.     Breath sounds: Normal breath sounds.  Musculoskeletal:        General: Normal range of motion.     Cervical back: Normal range of motion and neck supple.     Right hip: Normal.     Left hip: Normal.     Right upper leg: Normal.     Left upper leg: Normal.     Right knee: Normal.     Left knee: Tenderness present.     Right lower leg: Normal.     Left lower leg: Normal.     Right ankle: Normal.     Left ankle: Normal.     Right foot: Normal.     Left foot: Normal.  Neurological:     General: No focal deficit present.     Mental Status: She is alert and  oriented to person, place, and time.  Psychiatric:        Mood and Affect: Mood normal.        Behavior: Behavior normal.     ASSESSMENT AND PLAN: 1. Primary hypertension (Primary) - Continue Metoprolol  Succinate as prescribed.  - Routine screening.  - Counseled on blood pressure goal of less than 130/80, low-sodium, DASH diet, medication compliance, and 150 minutes of moderate intensity exercise per week as tolerated. Counseled on medication adherence and adverse effects. - Follow-up with primary provider in 3 months or sooner if needed. - Basic Metabolic Panel - metoprolol  succinate (TOPROL  XL) 50 MG 24 hr tablet; Take 1 tablet (50 mg total) by mouth daily. Take with or immediately following a meal.  Dispense: 90 tablet; Refill: 0  2. Screening cholesterol level - Routine screening.  - Lipid panel  3. Pain and swelling of knee, left - Referral to Orthopedic Surgery for evaluation/management. - Ambulatory referral to Orthopedic Surgery  4. Perennial allergic rhinitis - Continue Loratadine  as prescribed. Counseled on medication adherence/adverse effects.  - Follow-up with primary provider as scheduled. - Loratadine  5 MG TBDP; Take 1 tablet (5 mg total) by mouth daily as needed.  Dispense: 90 tablet; Refill: 0  Patient was given the opportunity to ask questions.  Patient verbalized understanding of the plan and was able to repeat key elements of the plan. Patient was given clear instructions to go to Emergency Department or return to medical center if symptoms don't improve, worsen, or new problems develop.The patient verbalized understanding.   Orders Placed This Encounter  Procedures   Basic Metabolic Panel   Lipid panel  Ambulatory referral to Orthopedic Surgery    Return in 3 months (on 06/26/2024) for Follow-Up or next available chronic conditions.  Greig JINNY Drones, NP

## 2024-03-27 ENCOUNTER — Ambulatory Visit: Payer: Self-pay | Admitting: Family

## 2024-03-27 DIAGNOSIS — N1832 Chronic kidney disease, stage 3b: Secondary | ICD-10-CM

## 2024-03-27 DIAGNOSIS — E785 Hyperlipidemia, unspecified: Secondary | ICD-10-CM

## 2024-03-27 LAB — BASIC METABOLIC PANEL WITH GFR
BUN/Creatinine Ratio: 33 — ABNORMAL HIGH (ref 12–28)
BUN: 42 mg/dL — ABNORMAL HIGH (ref 8–27)
CO2: 22 mmol/L (ref 20–29)
Calcium: 10.5 mg/dL — ABNORMAL HIGH (ref 8.7–10.3)
Chloride: 101 mmol/L (ref 96–106)
Creatinine, Ser: 1.26 mg/dL — ABNORMAL HIGH (ref 0.57–1.00)
Glucose: 89 mg/dL (ref 70–99)
Potassium: 4.2 mmol/L (ref 3.5–5.2)
Sodium: 141 mmol/L (ref 134–144)
eGFR: 41 mL/min/1.73 — ABNORMAL LOW (ref >59)

## 2024-03-27 LAB — LIPID PANEL
Chol/HDL Ratio: 2.6 ratio (ref 0.0–4.4)
Cholesterol, Total: 251 mg/dL — ABNORMAL HIGH (ref 100–199)
HDL: 97 mg/dL (ref >39)
LDL Chol Calc (NIH): 142 mg/dL — ABNORMAL HIGH (ref 0–99)
Triglycerides: 73 mg/dL (ref 0–149)
VLDL Cholesterol Cal: 12 mg/dL (ref 5–40)

## 2024-03-27 MED ORDER — ATORVASTATIN CALCIUM 10 MG PO TABS: 10.0000 mg | ORAL_TABLET | Freq: Every day | ORAL | 0 refills | Status: DC

## 2024-03-29 ENCOUNTER — Telehealth: Payer: Self-pay | Admitting: Orthopedic Surgery

## 2024-03-29 ENCOUNTER — Ambulatory Visit

## 2024-03-29 ENCOUNTER — Ambulatory Visit (INDEPENDENT_AMBULATORY_CARE_PROVIDER_SITE_OTHER)

## 2024-03-29 ENCOUNTER — Ambulatory Visit
Admission: EM | Admit: 2024-03-29 | Discharge: 2024-03-29 | Disposition: A | Discharge status: Home / Self Care | Attending: Nurse Practitioner | Admitting: Nurse Practitioner

## 2024-03-29 ENCOUNTER — Encounter: Payer: Self-pay | Admitting: Emergency Medicine

## 2024-03-29 DIAGNOSIS — G8929 Other chronic pain: Secondary | ICD-10-CM

## 2024-03-29 DIAGNOSIS — N1832 Chronic kidney disease, stage 3b: Secondary | ICD-10-CM | POA: Diagnosis not present

## 2024-03-29 DIAGNOSIS — M25462 Effusion, left knee: Secondary | ICD-10-CM | POA: Diagnosis not present

## 2024-03-29 DIAGNOSIS — M25862 Other specified joint disorders, left knee: Secondary | ICD-10-CM | POA: Diagnosis not present

## 2024-03-29 DIAGNOSIS — M1712 Unilateral primary osteoarthritis, left knee: Secondary | ICD-10-CM | POA: Diagnosis not present

## 2024-03-29 DIAGNOSIS — M25562 Pain in left knee: Secondary | ICD-10-CM

## 2024-03-29 DIAGNOSIS — M2580 Other specified joint disorders, unspecified joint: Secondary | ICD-10-CM | POA: Diagnosis not present

## 2024-03-29 MED ORDER — DICLOFENAC SODIUM 1 % EX GEL: 4.0000 g | Freq: Four times a day (QID) | CUTANEOUS | 0 refills | Status: AC

## 2024-03-29 NOTE — ED Triage Notes (Signed)
 Pt has chronic L knee pain (10years), but L knee pain has gotten significantly worse over the last month. Pt notes slight swelling to joint. Notes the knee has been significantly stiffer and more achy x1 month. She has been using a cane for the last month due to the pain. No med use for symptoms. Pt able to bend knee and denies limited ROM. No injuries to area or falls.

## 2024-03-29 NOTE — Discharge Instructions (Addendum)
 You were seen today for worsening left knee pain and swelling. Your last knee X-ray in January 2023 showed arthritis changes and calcium  buildup in the joint. A new knee X-ray has been ordered to check for changes. You were prescribed diclofenac  gel to apply to the knee up to four times daily and given a knee sleeve for support. Please avoid taking over-the-counter NSAIDs such as ibuprofen, Advil, Motrin, or Aleve because of your kidney function. You may use Tylenol for pain instead. Resting the knee, applying ice or heat, and elevating the leg can also help reduce pain and swelling. Gentle stretching and use of the cane for stability are recommended until symptoms improve.  Your referral to orthopedics has already been placed, and their office called you today with instructions to schedule an appointment. Please check your voicemail and call back today or first thing Monday morning to arrange your appointment with Dr. Cordella Hutchinson or [place]. Make sure to monitor your phone calls and return any missed calls from your healthcare providers, as this is the main way we can help coordinate your care.  Your recent labs show reduced kidney function with a creatinine level of 1.26, which is higher than in 2023. To protect your kidneys, increase your daily water intake and avoid any medications that may worsen kidney function, including NSAIDs.  Please follow up with your primary care provider to monitor your kidney function and with orthopedics for your knee. Go to the emergency department immediately if you develop sudden severe knee pain, inability to walk or bear weight, fever, redness, or warmth around the joint, or if you notice concerning symptoms such as decreased urination, confusion, swelling, or feeling very unwell.

## 2024-03-29 NOTE — Telephone Encounter (Signed)
 Called pt and left a vm for pt to call and make an appt with Addie or Herlene for Left knee pain and swelling. Please attach referral

## 2024-03-29 NOTE — ED Provider Notes (Signed)
 [facility]    CSN: [id number] Arrival date & time: 03/29/24  1324      History   Chief Complaint Chief Complaint  Patient presents with   Knee Pain    HPI Rebecca Estes is a 89 y.o. female.   Discussed the use of AI scribe software for clinical note transcription with the patient, who gave verbal consent to proceed.   The patient presents with left knee pain, a chronic issue for several years that has worsened over the past month. 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 No repeat imaging has been obtained since that time.  The patient reports that her knee pain has significantly increased in severity over the last month, with stiffness and aching that fluctuate but have become more frequent. She now requires the use of a cane to reduce instability and prevent falls. The knee occasionally swells, sometimes described as like a balloon. She was seen in this clinic on 03/08/2024 for the same complaint and was given a short course of prednisone  for symptom relief. At her primary care visit on 03/26/2024, the knee pain was again documented, and a referral to orthopedics was placed, which is still pending. She denies numbness, tingling, or loss of sensation in the affected extremity.  The following portions of the patient's history were reviewed and updated as appropriate: allergies, current medications, past family history, past medical history, past social history, past surgical history, and problem list.    Past Medical History:  Diagnosis Date   Arrhythmia    Bleeding ulcer    Cancer (HCC)    Hiatal hernia    Hypertension    Hyperthyroidism    Osteoporosis    Thoracic ascending aortic aneurysm (HCC) 11/06/2017   41mm diagnosed by echo 10/24/17.    Patient Active Problem List   Diagnosis Date Noted    Pain in left knee 12/01/2021   Ceruminosis, right 10/04/2019   Back muscle spasm 02/15/2019   Muscle cramp, nocturnal 12/27/2018   Cough 10/12/2018   Hernia of abdominal wall 10/12/2018   Aortic atherosclerosis (HCC) 07/24/2018   Chronic pain of both shoulders 07/24/2018   BMI less than 19,adult 02/01/2018   Difficulty chewing due to dentures 02/01/2018   Thoracic ascending aortic aneurysm (HCC) 11/06/2017   Palpitations 10/05/2017   Seasonal allergies 09/18/2017   Essential hypertension 05/26/2016   Osteoporosis 05/26/2016   Subclinical hyperthyroidism 05/26/2016    Past Surgical History:  Procedure Laterality Date   ANTERIOR CERVICAL DECOMP/DISCECTOMY FUSION     x3   BREAST BIOPSY Left 02/10/2009   NECK SURGERY     ROTATOR CUFF REPAIR     Left   TUBAL LIGATION     UMBILICAL HERNIA REPAIR      OB History   No obstetric history on file.      Home Medications    Prior to Admission medications   Medication Sig Start Date End Date Taking? Authorizing Provider  amLODipine  (NORVASC ) 10 MG tablet TAKE 1 TABLET EVERY DAY 08/24/22  Yes Lorren Greig PARAS, NP  aspirin EC 325 MG tablet Take 325 mg by mouth every morning.   Yes [provider]  atorvastatin  (LIPITOR) 10 MG tablet Take 1 tablet (10 mg total) by mouth daily. 03/27/24  Yes Lorren, Amy J, NP  diclofenac  Sodium (VOLTAREN ) 1 %  GEL Apply 4 g topically 4 (four) times daily. Apply to left knee four times a day for pain 03/29/24  Yes Iola Lukes, FNP  Loratadine  5 MG TBDP Take 1 tablet (5 mg total) by mouth daily as needed. 03/26/24  Yes Lorren, Amy J, NP  metoprolol  succinate (TOPROL  XL) 50 MG 24 hr tablet Take 1 tablet (50 mg total) by mouth daily. Take with or immediately following a meal. 03/26/24 03/26/25 Yes Lorren, Amy J, NP  triamcinolone  cream (KENALOG ) 0.1 % Apply 1 Application topically 2 (two) times daily. 02/05/24  Yes Billy Asberry FALCON, PA-C  alendronate  (FOSAMAX ) 70 MG tablet Take 1 tablet (70 mg  total) by mouth once a week. Take with a full glass of water on an empty stomach. Patient not taking: Reported on 10/23/2023 10/08/19   Rosan Dayton BROCKS, DO  amoxicillin -clavulanate (AUGMENTIN ) 500-125 MG tablet Take 1 tablet by mouth in the morning and at bedtime. Patient not taking: Reported on 01/24/2024 01/15/24   Raspet, Erin K, PA-C  Cholecalciferol (VITAMIN D3) 25 MCG (1000 UT) CAPS Vitamin D3 1,000 unit capsule  Take 1 capsule every day by oral route. Patient not taking: Reported on 10/23/2023 03/06/19   Aslam, Sadia, MD  docusate sodium  (COLACE) 100 MG capsule Take 1 capsule (100 mg total) by mouth every 12 (twelve) hours. Patient not taking: Reported on 10/23/2023 07/13/23   Vear, Amy L, PA  glycerin  adult 2 g suppository Place 1 suppository rectally as needed for constipation. Patient not taking: Reported on 10/23/2023 07/13/23   Vear, Amy L, PA  hydrochlorothiazide  (HYDRODIURIL ) 50 MG tablet TAKE 1 TABLET EVERY DAY Patient not taking: Reported on 10/23/2023 08/24/22   Lorren Greig PARAS, NP  loperamide  (IMODIUM ) 2 MG capsule Take 1 capsule (2 mg total) by mouth 2 (two) times daily as needed for diarrhea or loose stools. Patient not taking: Reported on 10/23/2023 06/26/23   Christopher Savannah, PA-C  memantine  (NAMENDA ) 10 MG tablet Take 1 tablet (10 mg total) by mouth 2 (two) times daily. Patient not taking: Reported on 10/23/2023 05/09/22   Penumalli, Eduard SAUNDERS, MD  ondansetron  (ZOFRAN -ODT) 4 MG disintegrating tablet Take 1 tablet (4 mg total) by mouth every 8 (eight) hours as needed for nausea or vomiting. Patient not taking: Reported on 10/23/2023 06/26/23   Christopher Savannah, PA-C  pravastatin  (PRAVACHOL ) 40 MG tablet Take 1 tablet (40 mg total) by mouth every evening. Patient not taking: Reported on 10/23/2023 09/19/22 12/18/22  Lorren Greig PARAS, NP    Family History Family History  Problem Relation Age of Onset   Aneurysm Father 62       Brain   Other Mother 56       Blood Infection   Coronary artery  disease Brother 23   Stroke Brother 53   Aneurysm Sister 49       Brain   Cervical cancer Sister 15   Kidney failure Sister 7       was on Dialysis   Aneurysm Sister        Brain    Social History Social History   Tobacco Use   Smoking status: Never    Passive exposure: Never   Smokeless tobacco: Never  Vaping Use   Vaping status: Never Used  Substance Use Topics   Alcohol use: No   Drug use: No     Allergies   Patient has no known allergies.   Review of Systems Review of Systems  Musculoskeletal:  Positive for arthralgias (left knee),  gait problem (occasionally due to the knee pain) and joint swelling (occasional knee swelling).  Neurological:  Negative for weakness and numbness.  All other systems reviewed and are negative.    Physical Exam Triage Vital Signs ED Triage Vitals  Encounter Vitals Group     BP 03/29/24 1514 (!) 173/84     Girls Systolic BP Percentile --      Girls Diastolic BP Percentile --      Boys Systolic BP Percentile --      Boys Diastolic BP Percentile --      Pulse --      Resp 03/29/24 1514 18     Temp 03/29/24 1514 98.1 F (36.7 C)     Temp Source 03/29/24 1514 Oral     SpO2 03/29/24 1514 97 %     Weight --      Height --      Head Circumference --      Peak Flow --      Pain Score 03/29/24 1530 3     Pain Loc --      Pain Education --      Exclude from Growth Chart --    No data found.  Updated Vital Signs BP (!) 161/83 (BP Location: Left Arm)   Temp 98.1 F (36.7 C) (Oral)   Resp 18   SpO2 97%   Visual Acuity Right Eye Distance:   Left Eye Distance:   Bilateral Distance:    Right Eye Near:   Left Eye Near:    Bilateral Near:     Physical Exam Vitals reviewed.  Constitutional:      General: She is awake. She is not in acute distress.    Appearance: Normal appearance. She is well-developed. She is not ill-appearing, toxic-appearing or diaphoretic.  HENT:     Head: Normocephalic.     Right Ear: Hearing  normal.     Left Ear: Hearing normal.     Nose: Nose normal.     Mouth/Throat:     Mouth: Mucous membranes are moist.  Eyes:     General: Vision grossly intact.     Conjunctiva/sclera: Conjunctivae normal.  Cardiovascular:     Rate and Rhythm: Normal rate and regular rhythm.     Heart sounds: Normal heart sounds.  Pulmonary:     Effort: Pulmonary effort is normal.     Breath sounds: Normal breath sounds and air entry.  Musculoskeletal:        General: Normal range of motion.     Cervical back: Full passive range of motion without pain, normal range of motion and neck supple.     Left knee: No swelling, deformity, effusion, erythema, bony tenderness or crepitus. Normal range of motion. No tenderness. Normal alignment, normal meniscus and normal patellar mobility.  Skin:    General: Skin is warm and dry.  Neurological:     General: No focal deficit present.     Mental Status: She is alert and oriented to person, place, and time.     Sensory: Sensation is intact. No sensory deficit.     Motor: Motor function is intact.     Coordination: Coordination is intact.     Gait: Gait is intact.  Psychiatric:        Speech: Speech normal.        Behavior: Behavior is cooperative.      UC Treatments / Results  Labs (all labs ordered are listed, but only abnormal results are displayed) Labs  Reviewed - No data to display  EKG   Radiology No results found.  Procedures Procedures (including critical care time)  Medications Ordered in UC Medications - No data to display  Initial Impression / Assessment and Plan / UC Course  I have reviewed the triage vital signs and the nursing notes.  Pertinent labs & imaging results that were available during my care of the patient were reviewed by me and considered in my medical decision making (see chart for details).    The patient presents with worsening left knee pain and intermittent swelling over the past month on the background of  chronic knee pain for several years. Her last X-ray in January 2023 demonstrated joint space narrowing, sclerotic changes, and CPPD in the left knee. She was previously treated with a short course of prednisone  on 03/08/2024 and evaluated by her PCP on 03/26/2024, when a referral to orthopedics was placed. Differential includes progression of osteoarthritis, CPPD flare, or other intra-articular pathology. A new knee X-ray was ordered to evaluate for interval changes, and diclofenac  gel was prescribed for topical use up to four times daily. A knee sleeve was provided for added support. She was informed that orthopedics has already attempted contact and left a voicemail earlier today and was advised to check voicemails regularly and return calls promptly to avoid further delays in care. She was encouraged to call back today or first thing Monday to secure an appointment with orthopedics.  Recent labs from 03/27/2024 show reduced kidney function with creatinine elevated to 1.26 compared to normal in 2023. This necessitates avoidance of oral NSAIDs. The patient was counseled to increase oral hydration and use Tylenol for pain relief instead of over-the-counter NSAIDs to reduce the risk of further kidney impairment. She was advised to follow up with orthopedics once scheduled and with her primary care provider to monitor kidney function. She was instructed to seek emergency care for sudden severe knee pain, inability to bear weight, fever, or signs of infection, or if she develops symptoms of worsening renal dysfunction such as decreased urination, confusion, or swelling.  Today's evaluation has revealed no signs of a dangerous process. Discussed diagnosis with patient and/or guardian. Patient and/or guardian aware of their diagnosis, possible red flag symptoms to watch out for and need for close follow up. Patient and/or guardian understands verbal and written discharge instructions. Patient and/or guardian comfortable  with plan and disposition.  Patient and/or guardian has a clear mental status at this time, good insight into illness (after discussion and teaching) and has clear judgment to make decisions regarding their care  Documentation was completed with the aid of voice recognition software. Transcription may contain typographical errors.   Final Clinical Impressions(s) / UC Diagnoses   Final diagnoses:  Chronic pain of left knee  Stage 3b chronic kidney disease (CKD) (HCC)     Discharge Instructions      You were seen today for worsening left knee pain and swelling. Your last knee X-ray in January 2023 showed arthritis changes and calcium  buildup in the joint. A new knee X-ray has been ordered to check for changes. You were prescribed diclofenac  gel to apply to the knee up to four times daily and given a knee sleeve for support. Please avoid taking over-the-counter NSAIDs such as ibuprofen, Advil, Motrin, or Aleve because of your kidney function. You may use Tylenol for pain instead. Resting the knee, applying ice or heat, and elevating the leg can also help reduce pain and swelling. Gentle stretching  and use of the cane for stability are recommended until symptoms improve.  Your referral to orthopedics has already been placed, and their office called you today with instructions to schedule an appointment. Please check your voicemail and call back today or first thing Monday morning to arrange your appointment with Dr. Cordella Hutchinson or [place]. Make sure to monitor your phone calls and return any missed calls from your healthcare providers, as this is the main way we can help coordinate your care.  Your recent labs show reduced kidney function with a creatinine level of 1.26, which is higher than in 2023. To protect your kidneys, increase your daily water intake and avoid any medications that may worsen kidney function, including NSAIDs.  Please follow up with your primary care provider to monitor your  kidney function and with orthopedics for your knee. Go to the emergency department immediately if you develop sudden severe knee pain, inability to walk or bear weight, fever, redness, or warmth around the joint, or if you notice concerning symptoms such as decreased urination, confusion, swelling, or feeling very unwell.      ED Prescriptions     Medication Sig Dispense Auth. Provider   diclofenac  Sodium (VOLTAREN ) 1 % GEL Apply 4 g topically 4 (four) times daily. Apply to left knee four times a day for pain 350 g Iola Lukes, FNP      PDMP not reviewed this encounter.   Iola [place], [place] 03/29/24 [phone]

## 2024-03-30 ENCOUNTER — Encounter: Payer: Self-pay | Admitting: Emergency Medicine

## 2024-03-30 ENCOUNTER — Ambulatory Visit: Admission: EM | Admit: 2024-03-30 | Discharge: 2024-03-30 | Disposition: A | Discharge status: Home / Self Care

## 2024-03-30 DIAGNOSIS — Z711 Person with feared health complaint in whom no diagnosis is made: Secondary | ICD-10-CM | POA: Diagnosis not present

## 2024-03-30 NOTE — ED Triage Notes (Signed)
 Pt c/o being constipation   St's last BM was yesterday but says she usually goes every morning but did not go this morning

## 2024-03-30 NOTE — Discharge Instructions (Addendum)
 Make sure you are drinking lots of water!!!  You can also try prunes or prune juice   If you do not have a bowel movement in 7 days, or if you have straining with bowel movement, any abdominal pain, or rectal bleeding, please be seen!

## 2024-03-30 NOTE — ED Provider Notes (Signed)
 [facility]    CSN: [id number] Arrival date & time: 03/30/24  1310      History   Chief Complaint Chief Complaint  Patient presents with   Constipation    HPI LILOU KNEIP is a 89 y.o. female.  Concerned because she had a BM 2 days ago, but has not had one yet today Reports she sometimes goes daily or every other day. Does not have to strain with BM. Comes out normally No abdominal pain, no rectal bleeding or pressure Normal urination No fevers  Does not drink much water  Past Medical History:  Diagnosis Date   Arrhythmia    Bleeding ulcer    Cancer (HCC)    Hiatal hernia    Hypertension    Hyperthyroidism    Osteoporosis    Thoracic ascending aortic aneurysm (HCC) 11/06/2017   41mm diagnosed by echo 10/24/17.    Patient Active Problem List   Diagnosis Date Noted   Pain in left knee 12/01/2021   Ceruminosis, right 10/04/2019   Back muscle spasm 02/15/2019   Muscle cramp, nocturnal 12/27/2018   Cough 10/12/2018   Hernia of abdominal wall 10/12/2018   Aortic atherosclerosis (HCC) 07/24/2018   Chronic pain of both shoulders 07/24/2018   BMI less than 19,adult 02/01/2018   Difficulty chewing due to dentures 02/01/2018   Thoracic ascending aortic aneurysm (HCC) 11/06/2017   Palpitations 10/05/2017   Seasonal allergies 09/18/2017   Essential hypertension 05/26/2016   Osteoporosis 05/26/2016   Subclinical hyperthyroidism 05/26/2016    Past Surgical History:  Procedure Laterality Date   ANTERIOR CERVICAL DECOMP/DISCECTOMY FUSION     x3   BREAST BIOPSY Left 02/10/2009   NECK SURGERY     ROTATOR CUFF REPAIR     Left   TUBAL LIGATION     UMBILICAL HERNIA REPAIR      OB History   No obstetric history on file.      Home Medications    Prior to Admission medications   Medication Sig Start Date End Date Taking? Authorizing Provider  alendronate  (FOSAMAX ) 70 MG tablet Take 1 tablet (70 mg total) by mouth once a week. Take with a  full glass of water on an empty stomach. Patient not taking: Reported on 10/23/2023 10/08/19   Rosan Dayton BROCKS, DO  amLODipine  (NORVASC ) 10 MG tablet TAKE 1 TABLET EVERY DAY 08/24/22   Lorren Greig PARAS, NP  amoxicillin -clavulanate (AUGMENTIN ) 500-125 MG tablet Take 1 tablet by mouth in the morning and at bedtime. Patient not taking: Reported on 01/24/2024 01/15/24   Raspet, Erin K, PA-C  aspirin EC 325 MG tablet Take 325 mg by mouth every morning.    [provider]  atorvastatin  (LIPITOR) 10 MG tablet Take 1 tablet (10 mg total) by mouth daily. 03/27/24   Lorren Greig PARAS, NP  Cholecalciferol (VITAMIN D3) 25 MCG (1000 UT) CAPS Vitamin D3 1,000 unit capsule  Take 1 capsule every day by oral route. Patient not taking: Reported on 10/23/2023 03/06/19   Aslam, Sadia, MD  diclofenac  Sodium (VOLTAREN ) 1 % GEL Apply 4 g topically 4 (four) times daily. Apply to left knee four times a day for pain 03/29/24   Iola Lukes, FNP  docusate sodium  (COLACE) 100 MG capsule Take 1 capsule (100 mg total) by mouth every 12 (twelve) hours. Patient not taking: Reported on 10/23/2023 07/13/23   Vear, Amy L, PA  glycerin  adult 2 g suppository Place 1 suppository rectally as needed for constipation. Patient not taking: Reported  on 10/23/2023 07/13/23   Vear Greig CROME, PA  hydrochlorothiazide  (HYDRODIURIL ) 50 MG tablet TAKE 1 TABLET EVERY DAY Patient not taking: Reported on 10/23/2023 08/24/22   Lorren Greig PARAS, NP  loperamide  (IMODIUM ) 2 MG capsule Take 1 capsule (2 mg total) by mouth 2 (two) times daily as needed for diarrhea or loose stools. Patient not taking: Reported on 10/23/2023 06/26/23   Christopher Savannah, PA-C  Loratadine  5 MG TBDP Take 1 tablet (5 mg total) by mouth daily as needed. 03/26/24   Lorren Greig PARAS, NP  memantine  (NAMENDA ) 10 MG tablet Take 1 tablet (10 mg total) by mouth 2 (two) times daily. Patient not taking: Reported on 10/23/2023 05/09/22   Penumalli, Vikram R, MD  metoprolol  succinate (TOPROL  XL) 50 MG  24 hr tablet Take 1 tablet (50 mg total) by mouth daily. Take with or immediately following a meal. 03/26/24 03/26/25  Lorren Greig PARAS, NP  ondansetron  (ZOFRAN -ODT) 4 MG disintegrating tablet Take 1 tablet (4 mg total) by mouth every 8 (eight) hours as needed for nausea or vomiting. Patient not taking: Reported on 10/23/2023 06/26/23   Christopher Savannah, PA-C  pravastatin  (PRAVACHOL ) 40 MG tablet Take 1 tablet (40 mg total) by mouth every evening. Patient not taking: Reported on 10/23/2023 09/19/22 12/18/22  Lorren Greig PARAS, NP  triamcinolone  cream (KENALOG ) 0.1 % Apply 1 Application topically 2 (two) times daily. 02/05/24   Billy Asberry FALCON, PA-C    Family History Family History  Problem Relation Age of Onset   Aneurysm Father 37       Brain   Other Mother 40       Blood Infection   Coronary artery disease Brother 47   Stroke Brother 70   Aneurysm Sister 75       Brain   Cervical cancer Sister 29   Kidney failure Sister 42       was on Dialysis   Aneurysm Sister        Brain    Social History Social History   Tobacco Use   Smoking status: Never    Passive exposure: Never   Smokeless tobacco: Never  Vaping Use   Vaping status: Never Used  Substance Use Topics   Alcohol use: No   Drug use: No     Allergies   Patient has no known allergies.   Review of Systems Review of Systems As per HPI  Physical Exam Triage Vital Signs ED Triage Vitals [03/30/24 1347]  Encounter Vitals Group     BP 115/68     Girls Systolic BP Percentile      Girls Diastolic BP Percentile      Boys Systolic BP Percentile      Boys Diastolic BP Percentile      Pulse Rate 60     Resp (!) 22     Temp 97.7 F (36.5 C)     Temp Source Oral     SpO2 96 %     Weight      Height      Head Circumference      Peak Flow      Pain Score 0     Pain Loc      Pain Education      Exclude from Growth Chart    No data found.  Updated Vital Signs BP 115/68 (BP Location: Left Arm)   Pulse 60   Temp 97.7  F (36.5 C) (Oral)   Resp (!) 22   SpO2 96%  Visual Acuity Right Eye Distance:   Left Eye Distance:   Bilateral Distance:    Right Eye Near:   Left Eye Near:    Bilateral Near:     Physical Exam Vitals and nursing note reviewed.  Constitutional:      Appearance: Normal appearance. She is not ill-appearing.  HENT:     Mouth/Throat:     Mouth: Mucous membranes are moist.     Pharynx: Oropharynx is clear.  Eyes:     Conjunctiva/sclera: Conjunctivae normal.  Cardiovascular:     Rate and Rhythm: Normal rate and regular rhythm.     Pulses: Normal pulses.     Heart sounds: Normal heart sounds.  Pulmonary:     Effort: Pulmonary effort is normal.     Breath sounds: Normal breath sounds.  Abdominal:     General: Bowel sounds are normal. There is no distension.     Palpations: Abdomen is soft. There is no mass.     Tenderness: There is no abdominal tenderness. There is no guarding or rebound.  Musculoskeletal:        General: Normal range of motion.  Skin:    General: Skin is warm and dry.  Neurological:     Mental Status: She is alert and oriented to person, place, and time.      UC Treatments / Results  Labs (all labs ordered are listed, but only abnormal results are displayed) Labs Reviewed - No data to display  EKG   Radiology DG Knee Complete 4 Views Left Result Date: 03/29/2024 CLINICAL DATA:  Acute on chronic knee pain. Left knee pain for 10 years but worse over the last month. Swelling. EXAM: LEFT KNEE - COMPLETE 4+ VIEW COMPARISON:  08/25/2021 FINDINGS: Degenerative changes in the left knee with medial and lateral compartment narrowing. Minimal osteophyte formation. Chondrocalcinosis. Calcification in the central anterior joint space may represent a loose body and is new since prior study. Moderate effusion. No evidence of acute fracture or dislocation. IMPRESSION: Degenerative changes in the left knee. Moderate effusion. Possible developing loose body.  Electronically Signed   By: Elsie Gravely M.D.   On: 03/29/2024 16:39    Procedures Procedures (including critical care time)  Medications Ordered in UC Medications - No data to display  Initial Impression / Assessment and Plan / UC Course  I have reviewed the triage vital signs and the nursing notes.  Pertinent labs & imaging results that were available during my care of the patient were reviewed by me and considered in my medical decision making (see chart for details).   Normal BM 2 days ago. Concerned hasn't had one today yet, as she usually goes daily or every other day. No symptoms. Reassurance provided to patient. I have recommended increasing water intake. Can try prunes or prune juice.  We have discussed symptoms that would be concerning.  Advised monitor for if she does not have a bowel movement after 6 or 7 days, if she has any straining or pain with bowel movement, abdominal pain, or rectal bleeding.  She understands to be seen for any of these symptoms. Otherwise discussed she could have a BM today or tomorrow, allow some time for regular body functions   Final Clinical Impressions(s) / UC Diagnoses   Final diagnoses:  Worried well     Discharge Instructions      Make sure you are drinking lots of water!!!  You can also try prunes or prune juice   If you do not  have a bowel movement in 7 days, or if you have straining with bowel movement, any abdominal pain, or rectal bleeding, please be seen!     ED Prescriptions   None    PDMP not reviewed this encounter.   Jeryl Stabs, PA-C 03/30/24 1644

## 2024-04-01 ENCOUNTER — Ambulatory Visit: Payer: Self-pay | Admitting: Nurse Practitioner

## 2024-04-01 ENCOUNTER — Emergency Department (HOSPITAL_COMMUNITY)

## 2024-04-01 ENCOUNTER — Other Ambulatory Visit: Payer: Self-pay

## 2024-04-01 ENCOUNTER — Emergency Department (HOSPITAL_COMMUNITY)
Admission: EM | Admit: 2024-04-01 | Discharge: 2024-04-01 | Disposition: A | Discharge status: Home / Self Care | Attending: Emergency Medicine | Admitting: Emergency Medicine

## 2024-04-01 DIAGNOSIS — W010XXA Fall on same level from slipping, tripping and stumbling without subsequent striking against object, initial encounter: Secondary | ICD-10-CM | POA: Diagnosis not present

## 2024-04-01 DIAGNOSIS — S8002XA Contusion of left knee, initial encounter: Secondary | ICD-10-CM | POA: Insufficient documentation

## 2024-04-01 DIAGNOSIS — R531 Weakness: Secondary | ICD-10-CM | POA: Diagnosis not present

## 2024-04-01 DIAGNOSIS — M25562 Pain in left knee: Secondary | ICD-10-CM

## 2024-04-01 DIAGNOSIS — M1712 Unilateral primary osteoarthritis, left knee: Secondary | ICD-10-CM | POA: Diagnosis not present

## 2024-04-01 DIAGNOSIS — E860 Dehydration: Secondary | ICD-10-CM | POA: Diagnosis not present

## 2024-04-01 DIAGNOSIS — Z859 Personal history of malignant neoplasm, unspecified: Secondary | ICD-10-CM | POA: Diagnosis not present

## 2024-04-01 DIAGNOSIS — S8992XA Unspecified injury of left lower leg, initial encounter: Secondary | ICD-10-CM | POA: Diagnosis present

## 2024-04-01 DIAGNOSIS — R262 Difficulty in walking, not elsewhere classified: Secondary | ICD-10-CM | POA: Diagnosis not present

## 2024-04-01 DIAGNOSIS — E02 Subclinical iodine-deficiency hypothyroidism: Secondary | ICD-10-CM | POA: Insufficient documentation

## 2024-04-01 DIAGNOSIS — M25572 Pain in left ankle and joints of left foot: Secondary | ICD-10-CM | POA: Diagnosis not present

## 2024-04-01 DIAGNOSIS — Z7982 Long term (current) use of aspirin: Secondary | ICD-10-CM | POA: Diagnosis not present

## 2024-04-01 DIAGNOSIS — Z79899 Other long term (current) drug therapy: Secondary | ICD-10-CM | POA: Insufficient documentation

## 2024-04-01 DIAGNOSIS — I10 Essential (primary) hypertension: Secondary | ICD-10-CM | POA: Insufficient documentation

## 2024-04-01 DIAGNOSIS — E038 Other specified hypothyroidism: Secondary | ICD-10-CM

## 2024-04-01 DIAGNOSIS — R4182 Altered mental status, unspecified: Secondary | ICD-10-CM | POA: Diagnosis not present

## 2024-04-01 DIAGNOSIS — I6782 Cerebral ischemia: Secondary | ICD-10-CM | POA: Diagnosis not present

## 2024-04-01 DIAGNOSIS — M85872 Other specified disorders of bone density and structure, left ankle and foot: Secondary | ICD-10-CM | POA: Diagnosis not present

## 2024-04-01 DIAGNOSIS — R0902 Hypoxemia: Secondary | ICD-10-CM | POA: Diagnosis not present

## 2024-04-01 DIAGNOSIS — M7732 Calcaneal spur, left foot: Secondary | ICD-10-CM | POA: Diagnosis not present

## 2024-04-01 DIAGNOSIS — R5383 Other fatigue: Secondary | ICD-10-CM | POA: Diagnosis not present

## 2024-04-01 LAB — COMPREHENSIVE METABOLIC PANEL WITH GFR
ALT: 16 U/L (ref 0–44)
AST: 22 U/L (ref 15–41)
Albumin: 3.8 g/dL (ref 3.5–5.0)
Alkaline Phosphatase: 62 U/L (ref 38–126)
Anion gap: 17 — ABNORMAL HIGH (ref 5–15)
BUN: 40 mg/dL — ABNORMAL HIGH (ref 8–23)
CO2: 20 mmol/L — ABNORMAL LOW (ref 22–32)
Calcium: 10 mg/dL (ref 8.9–10.3)
Chloride: 101 mmol/L (ref 98–111)
Creatinine, Ser: 1.52 mg/dL — ABNORMAL HIGH (ref 0.44–1.00)
GFR, Estimated: 33 mL/min — ABNORMAL LOW (ref >60)
Glucose, Bld: 105 mg/dL — ABNORMAL HIGH (ref 70–99)
Potassium: 3.3 mmol/L — ABNORMAL LOW (ref 3.5–5.1)
Sodium: 138 mmol/L (ref 135–145)
Total Bilirubin: 1.1 mg/dL (ref 0.0–1.2)
Total Protein: 7.9 g/dL (ref 6.5–8.1)

## 2024-04-01 LAB — CBC WITH DIFFERENTIAL/PLATELET
Abs Immature Granulocytes: 0.22 K/uL — ABNORMAL HIGH (ref 0.00–0.07)
Basophils Absolute: 0 K/uL (ref 0.0–0.1)
Basophils Relative: 0 %
Eosinophils Absolute: 0 K/uL (ref 0.0–0.5)
Eosinophils Relative: 0 %
HCT: 35.9 % — ABNORMAL LOW (ref 36.0–46.0)
Hemoglobin: 11.8 g/dL — ABNORMAL LOW (ref 12.0–15.0)
Immature Granulocytes: 3 %
Lymphocytes Relative: 12 %
Lymphs Abs: 1 K/uL (ref 0.7–4.0)
MCH: 29.9 pg (ref 26.0–34.0)
MCHC: 32.9 g/dL (ref 30.0–36.0)
MCV: 90.9 fL (ref 80.0–100.0)
Monocytes Absolute: 2.2 K/uL — ABNORMAL HIGH (ref 0.1–1.0)
Monocytes Relative: 26 %
Neutro Abs: 4.8 K/uL (ref 1.7–7.7)
Neutrophils Relative %: 59 %
Platelets: 228 K/uL (ref 150–400)
RBC: 3.95 MIL/uL (ref 3.87–5.11)
RDW: 12.7 % (ref 11.5–15.5)
WBC: 8.2 K/uL (ref 4.0–10.5)
nRBC: 0 % (ref 0.0–0.2)

## 2024-04-01 LAB — T4, FREE: Free T4: 1.02 ng/dL (ref 0.61–1.12)

## 2024-04-01 LAB — TSH: TSH: 0.077 u[IU]/mL — ABNORMAL LOW (ref 0.350–4.500)

## 2024-04-01 LAB — TROPONIN I (HIGH SENSITIVITY)
Troponin I (High Sensitivity): 14 ng/L (ref <18)
Troponin I (High Sensitivity): 14 ng/L (ref <18)

## 2024-04-01 LAB — AMMONIA: Ammonia: 16 umol/L (ref 9–35)

## 2024-04-01 MED ORDER — SODIUM CHLORIDE 0.9 % IV BOLUS
1000.0000 mL | Freq: Once | INTRAVENOUS | Status: AC
  Administered 2024-04-01: 1000 mL via INTRAVENOUS

## 2024-04-01 MED ORDER — ACETAMINOPHEN 500 MG PO TABS
1000.0000 mg | ORAL_TABLET | Freq: Once | ORAL | Status: AC
  Administered 2024-04-01: 1000 mg via ORAL
  Filled 2024-04-01: qty 2

## 2024-04-01 MED ORDER — CYCLOBENZAPRINE HCL 10 MG PO TABS
5.0000 mg | ORAL_TABLET | Freq: Once | ORAL | Status: AC
  Administered 2024-04-01: 5 mg via ORAL
  Filled 2024-04-01: qty 1

## 2024-04-01 NOTE — ED Provider Notes (Signed)
  Physical Exam  BP 133/71   Pulse 93   Temp 98.6 F (37 C)   Resp 15   Ht 5' 5 (1.651 m)   Wt 46.3 kg   SpO2 100%   BMI 16.97 kg/m   Physical Exam  Procedures  Procedures  ED Course / MDM    Medical Decision Making Amount and/or Complexity of Data Reviewed Labs: ordered. Radiology: ordered.  Risk OTC drugs. Prescription drug management.   Received in signout.  Fall.  Workup reassuring.  Discussed with patient and no urinary symptoms.  Has been able to ambulate we will discharge home.       Patsey Lot, MD 04/01/24 [id number]

## 2024-04-01 NOTE — ED Provider Notes (Signed)
 [place] EMERGENCY DEPARTMENT AT [facility] Provider Note  CSN: [id number] Arrival date & time: 04/01/24 9040  Chief Complaint(s) Fall and Knee Pain  HPI Rebecca Estes is a 89 y.o. female with past medical history as below, significant for hypertension, ascending aortic aneurysm, chronic left knee pain, hypothyroid who presents to the ED with complaint of left knee pain, left ankle pain, generalized weakness/fatigue  Patient reports she had a fall a few weeks ago and has been having worsening pain to her left knee.  Reports she has chronic pain to her left knee but seems to be worse, having difficulty walking secondary to the pain.  Also pain to her left ankle.  Noticed some swelling to her left knee that seems to be worsening.  No pain to her hip.  No pain to her right leg.  Patient reports that when she fell few weeks ago she tripped over something on the ground fell over.  Denies any head injury.  Patient also does note mildly reduced appetite, fatigue, family expressed concern for intermittent confusion.  Past Medical History Past Medical History:  Diagnosis Date   Arrhythmia    Bleeding ulcer    Cancer (HCC)    Hiatal hernia    Hypertension    Hyperthyroidism    Osteoporosis    Thoracic ascending aortic aneurysm (HCC) 11/06/2017   41mm diagnosed by echo 10/24/17.   Patient Active Problem List   Diagnosis Date Noted   Pain in left knee 12/01/2021   Ceruminosis, right 10/04/2019   Back muscle spasm 02/15/2019   Muscle cramp, nocturnal 12/27/2018   Cough 10/12/2018   Hernia of abdominal wall 10/12/2018   Aortic atherosclerosis (HCC) 07/24/2018   Chronic pain of both shoulders 07/24/2018   BMI less than 19,adult 02/01/2018   Difficulty chewing due to dentures 02/01/2018   Thoracic ascending aortic aneurysm (HCC) 11/06/2017   Palpitations 10/05/2017   Seasonal allergies 09/18/2017   Essential hypertension 05/26/2016   Osteoporosis 05/26/2016   Subclinical  hyperthyroidism 05/26/2016   Home Medication(s) Prior to Admission medications   Medication Sig Start Date End Date Taking? Authorizing Provider  alendronate  (FOSAMAX ) 70 MG tablet Take 1 tablet (70 mg total) by mouth once a week. Take with a full glass of water on an empty stomach. Patient not taking: Reported on 10/23/2023 10/08/19   Rosan Dayton BROCKS, DO  amLODipine  (NORVASC ) 10 MG tablet TAKE 1 TABLET EVERY DAY 08/24/22   Lorren Greig PARAS, NP  amoxicillin -clavulanate (AUGMENTIN ) 500-125 MG tablet Take 1 tablet by mouth in the morning and at bedtime. Patient not taking: Reported on 01/24/2024 01/15/24   Raspet, Erin K, PA-C  aspirin EC 325 MG tablet Take 325 mg by mouth every morning.    [provider]  atorvastatin  (LIPITOR) 10 MG tablet Take 1 tablet (10 mg total) by mouth daily. 03/27/24   Lorren Greig PARAS, NP  Cholecalciferol (VITAMIN D3) 25 MCG (1000 UT) CAPS Vitamin D3 1,000 unit capsule  Take 1 capsule every day by oral route. Patient not taking: Reported on 10/23/2023 03/06/19   Aslam, Sadia, MD  diclofenac  Sodium (VOLTAREN ) 1 % GEL Apply 4 g topically 4 (four) times daily. Apply to left knee four times a day for pain 03/29/24   Iola Lukes, FNP  docusate sodium  (COLACE) 100 MG capsule Take 1 capsule (100 mg total) by mouth every 12 (twelve) hours. Patient not taking: Reported on 10/23/2023 07/13/23   Vear Greig CROME, PA  glycerin  adult 2 g suppository  Place 1 suppository rectally as needed for constipation. Patient not taking: Reported on 10/23/2023 07/13/23   Vear Greig CROME, PA  hydrochlorothiazide  (HYDRODIURIL ) 50 MG tablet TAKE 1 TABLET EVERY DAY Patient not taking: Reported on 10/23/2023 08/24/22   Lorren Greig PARAS, NP  loperamide  (IMODIUM ) 2 MG capsule Take 1 capsule (2 mg total) by mouth 2 (two) times daily as needed for diarrhea or loose stools. Patient not taking: Reported on 10/23/2023 06/26/23   Christopher Savannah, PA-C  Loratadine  5 MG TBDP Take 1 tablet (5 mg total) by mouth daily as  needed. 03/26/24   Lorren Greig PARAS, NP  memantine  (NAMENDA ) 10 MG tablet Take 1 tablet (10 mg total) by mouth 2 (two) times daily. Patient not taking: Reported on 10/23/2023 05/09/22   Penumalli, Vikram R, MD  metoprolol  succinate (TOPROL  XL) 50 MG 24 hr tablet Take 1 tablet (50 mg total) by mouth daily. Take with or immediately following a meal. 03/26/24 03/26/25  Lorren Greig PARAS, NP  ondansetron  (ZOFRAN -ODT) 4 MG disintegrating tablet Take 1 tablet (4 mg total) by mouth every 8 (eight) hours as needed for nausea or vomiting. Patient not taking: Reported on 10/23/2023 06/26/23   Christopher Savannah, PA-C  pravastatin  (PRAVACHOL ) 40 MG tablet Take 1 tablet (40 mg total) by mouth every evening. Patient not taking: Reported on 10/23/2023 09/19/22 12/18/22  Lorren Greig PARAS, NP  triamcinolone  cream (KENALOG ) 0.1 % Apply 1 Application topically 2 (two) times daily. 02/05/24   Billy Asberry FALCON, PA-C                                                                                                                                    Past Surgical History Past Surgical History:  Procedure Laterality Date   ANTERIOR CERVICAL DECOMP/DISCECTOMY FUSION     x3   BREAST BIOPSY Left 02/10/2009   NECK SURGERY     ROTATOR CUFF REPAIR     Left   TUBAL LIGATION     UMBILICAL HERNIA REPAIR     Family History Family History  Problem Relation Age of Onset   Aneurysm Father 85       Brain   Other Mother 36       Blood Infection   Coronary artery disease Brother 41   Stroke Brother 1   Aneurysm Sister 54       Brain   Cervical cancer Sister 56   Kidney failure Sister 96       was on Dialysis   Aneurysm Sister        Brain    Social History Social History   Tobacco Use   Smoking status: Never    Passive exposure: Never   Smokeless tobacco: Never  Vaping Use   Vaping status: Never Used  Substance Use Topics   Alcohol use: No   Drug use: No   Allergies Patient has no known allergies.  Review of Systems  A  thorough review of systems was obtained and all systems are negative except as noted in the HPI and PMH.   Physical Exam Vital Signs  I have reviewed the triage vital signs BP 133/82 (BP Location: Right Arm)   Pulse 68   Temp 98.6 F (37 C) (Oral)   Resp 14   Ht 5' 5 (1.651 m)   Wt 46.3 kg   SpO2 100%   BMI 16.97 kg/m  Physical Exam Vitals and nursing note reviewed.  Constitutional:      General: She is not in acute distress.    Appearance: Normal appearance.     Comments: Frail  HENT:     Head: Normocephalic and atraumatic.     Right Ear: External ear normal.     Left Ear: External ear normal.     Nose: Nose normal.     Mouth/Throat:     Mouth: Mucous membranes are moist.  Eyes:     General: No scleral icterus.       Right eye: No discharge.        Left eye: No discharge.     Extraocular Movements: Extraocular movements intact.     Pupils: Pupils are equal, round, and reactive to light.  Cardiovascular:     Rate and Rhythm: Normal rate and regular rhythm.     Pulses: Normal pulses.     Heart sounds: Normal heart sounds.  Pulmonary:     Effort: Pulmonary effort is normal. No respiratory distress.     Breath sounds: Normal breath sounds. No stridor.  Abdominal:     General: Abdomen is flat. There is no distension.     Palpations: Abdomen is soft.     Tenderness: There is no abdominal tenderness.  Musculoskeletal:     Cervical back: No rigidity.     Right lower leg: No edema.     Left lower leg: No edema.       Legs:     Comments: She has full active and passive range of motion to left significant pain  Achilles, quadricep and patella tendons intact left lower extremity.  LE NVI bilateral  No pain with palpation or manipulation of her hip bilateral, pelvis stable  Skin:    General: Skin is warm and dry.     Capillary Refill: Capillary refill takes less than 2 seconds.  Neurological:     Mental Status: She is alert and oriented to person, place, and time.      GCS: GCS eye subscore is 4. GCS verbal subscore is 5. GCS motor subscore is 6.     Cranial Nerves: Cranial nerves 2-12 are intact. No dysarthria or facial asymmetry.     Sensory: Sensation is intact.     Motor: Motor function is intact. No tremor.     Coordination: Coordination is intact. Coordination normal.     Comments: Gait testing deferred secondary to patient safety. Strength 5/5 to BLUE/BLLE, equal and symmetric    Psychiatric:        Mood and Affect: Mood normal.        Behavior: Behavior normal. Behavior is cooperative.     ED Results and Treatments Labs (all labs ordered are listed, but only abnormal results are displayed) Labs Reviewed  COMPREHENSIVE METABOLIC PANEL WITH GFR  CBC WITH DIFFERENTIAL/PLATELET  AMMONIA  URINALYSIS, ROUTINE W REFLEX MICROSCOPIC  TROPONIN I (HIGH SENSITIVITY)  Radiology No results found.  Pertinent labs & imaging results that were available during my care of the patient were reviewed by me and considered in my medical decision making (see MDM for details).  Medications Ordered in ED Medications - No data to display                                                                                                                                   Procedures Procedures  (including critical care time)  Medical Decision Making / ED Course    Medical Decision Making:    DEVON KINGDON is a 89 y.o. female with past medical history as below, significant for hypertension, ascending aortic aneurysm, chronic left knee pain, hypothyroid who presents to the ED with complaint of left knee pain, left ankle pain, generalized weakness/fatigue. The complaint involves an extensive differential diagnosis and also carries with it a high risk of complications and morbidity.  Serious etiology was considered. Ddx includes but is  not limited to: Fracture, dislocation, sprain, strain, joint effusion, gout, septic arthritis, bursitis, electrolyte derangement, thyroid  disturbance, infection, dehydration ACS, etc.  Complete initial physical exam performed, notably the patient was in no acute distress, Neurontin.    Reviewed and confirmed nursing documentation for past medical history, family history, social history.  Vital signs reviewed.    Left knee/left ankle pain > - She has a joint effusion to her left knee, tendons appear intact, no significant pain with movement of her knee.  Does report intermittent pain to the medial aspect of her left knee provocative testing.  LE NVI  Intermittent confusion> - Family expressed concern for intermittent confusion, she is neuro intact currently, nonfocal exam.  Patient denies any subjective concern regarding confusion deficits. - CTH       ***               Additional history obtained: -Additional history obtained from {wsadditionalhistorian:28072} -External records from outside source obtained and reviewed including: Chart review including previous notes, labs, imaging, consultation notes including  ***   Lab Tests: -I ordered, reviewed, and interpreted labs.   The pertinent results include:   Labs Reviewed  COMPREHENSIVE METABOLIC PANEL WITH GFR  CBC WITH DIFFERENTIAL/PLATELET  AMMONIA  URINALYSIS, ROUTINE W REFLEX MICROSCOPIC  TROPONIN I (HIGH SENSITIVITY)    Notable for ***  EKG   EKG Interpretation Date/Time:    Ventricular Rate:    PR Interval:    QRS Duration:    QT Interval:    QTC Calculation:   R Axis:      Text Interpretation:           Imaging Studies ordered: I ordered imaging studies including *** I independently visualized the following imaging with scope of interpretation limited to determining acute life threatening conditions related to emergency care; findings noted above I agree with the radiologist  interpretation If any imaging was obtained with contrast I closely  monitored patient for any possible adverse reaction a/w contrast administration in the emergency department   Medicines ordered and prescription drug management: No orders of the defined types were placed in this encounter.   -I have reviewed the patients home medicines and have made adjustments as needed   Consultations Obtained: I requested consultation with the ***,  and discussed lab and imaging findings as well as pertinent plan - they recommend: ***   Cardiac Monitoring: The patient was maintained on a cardiac monitor.  I personally viewed and interpreted the cardiac monitored which showed an underlying rhythm of: *** Continuous pulse oximetry interpreted by myself, ***% on ***.    Social Determinants of Health:  Diagnosis or treatment significantly limited by social determinants of health: {wssoc:28071}   Reevaluation: After the interventions noted above, I reevaluated the patient and found that they have {resolved/improved/worsened:23923::improved}  Co morbidities that complicate the patient evaluation  Past Medical History:  Diagnosis Date   Arrhythmia    Bleeding ulcer    Cancer (HCC)    Hiatal hernia    Hypertension    Hyperthyroidism    Osteoporosis    Thoracic ascending aortic aneurysm (HCC) 11/06/2017   41mm diagnosed by echo 10/24/17.      Dispostion: Disposition decision including need for hospitalization was considered, and patient {wsdispo:28070::discharged from emergency department.}    Final Clinical Impression(s) / ED Diagnoses Final diagnoses:  None

## 2024-04-01 NOTE — Progress Notes (Signed)
 Left knee X-ray shows signs of arthritis, some extra fluid in the joint, and a possible small loose piece of bone or cartilage. Patient already has a referral to orthopedics. Remind her to call today to schedule an appointment with [facility] (Dr. Addie or [place], [place]). In the meantime, she should continue using the diclofenac  gel up to four times a day as prescribed, and wear the knee sleeve for added support. These measures should help manage pain and swelling until her orthopedic visit.

## 2024-04-01 NOTE — ED Notes (Signed)
 Daughter Elesa Roys [phone] would like an update asap

## 2024-04-01 NOTE — ED Triage Notes (Signed)
 Pt BIB [facility] from home. Says she has been falling a lot recently and has left knee and ankle pain. Husband says she also has had intermittent confusion lately when she is normally Aox4.

## 2024-04-01 NOTE — ED Notes (Signed)
 Pt and family bedside made aware of need of urine sample to rule out a urine infection. Pt states she does not want to be catheterized. Pt attempted to urinate and placed on a bedpan with no success.

## 2024-04-02 ENCOUNTER — Telehealth: Payer: Self-pay | Admitting: Orthopedic Surgery

## 2024-04-02 NOTE — Telephone Encounter (Signed)
 Called pt 2X and left vm for pt to call and make appt with [facility] or [employer]. Please attch referral for left knee pains

## 2024-04-11 ENCOUNTER — Telehealth: Payer: Self-pay | Admitting: Family

## 2024-04-11 NOTE — Telephone Encounter (Signed)
 Patients husband came in stating he is requesting a call back regarding his wife recent lab results from a UC visit last week. He states they told him his wife has kidney disease and she has an appointment scheduled for the 26th to see someone but she has been having recent symptoms of feeling exhausted and nauseous. Couldn't find that specific appointment in the system for the 26th. Attempted to schedule an appointment with PCP but husband declined and stated he just wants a call back to discuss options.

## 2024-04-12 ENCOUNTER — Telehealth: Payer: Self-pay | Admitting: Emergency Medicine

## 2024-04-12 ENCOUNTER — Ambulatory Visit: Payer: Self-pay | Admitting: Family

## 2024-04-12 ENCOUNTER — Other Ambulatory Visit (HOSPITAL_COMMUNITY)
Admission: RE | Admit: 2024-04-12 | Discharge: 2024-04-12 | Disposition: A | Discharge status: Home / Self Care | Source: Ambulatory Visit | Attending: Family | Admitting: Family

## 2024-04-12 ENCOUNTER — Encounter: Payer: Self-pay | Admitting: Family

## 2024-04-12 ENCOUNTER — Ambulatory Visit (INDEPENDENT_AMBULATORY_CARE_PROVIDER_SITE_OTHER): Admitting: Family

## 2024-04-12 VITALS — BP 138/87 | HR 68 | Temp 97.6°F | Resp 16 | Ht 65.0 in | Wt 96.6 lb

## 2024-04-12 DIAGNOSIS — E860 Dehydration: Secondary | ICD-10-CM

## 2024-04-12 DIAGNOSIS — N1832 Chronic kidney disease, stage 3b: Secondary | ICD-10-CM | POA: Diagnosis not present

## 2024-04-12 DIAGNOSIS — S8002XD Contusion of left knee, subsequent encounter: Secondary | ICD-10-CM | POA: Diagnosis not present

## 2024-04-12 DIAGNOSIS — E038 Other specified hypothyroidism: Secondary | ICD-10-CM

## 2024-04-12 DIAGNOSIS — R197 Diarrhea, unspecified: Secondary | ICD-10-CM

## 2024-04-12 DIAGNOSIS — Z113 Encounter for screening for infections with a predominantly sexual mode of transmission: Secondary | ICD-10-CM | POA: Insufficient documentation

## 2024-04-12 DIAGNOSIS — G8929 Other chronic pain: Secondary | ICD-10-CM | POA: Diagnosis not present

## 2024-04-12 DIAGNOSIS — M25562 Pain in left knee: Secondary | ICD-10-CM | POA: Diagnosis not present

## 2024-04-12 DIAGNOSIS — B9689 Other specified bacterial agents as the cause of diseases classified elsewhere: Secondary | ICD-10-CM

## 2024-04-12 DIAGNOSIS — B3731 Acute candidiasis of vulva and vagina: Secondary | ICD-10-CM

## 2024-04-12 LAB — POCT URINALYSIS DIP (CLINITEK)
Bilirubin, UA: NEGATIVE
Glucose, UA: NEGATIVE mg/dL
Ketones, POC UA: NEGATIVE mg/dL
Nitrite, UA: NEGATIVE
POC PROTEIN,UA: NEGATIVE
Spec Grav, UA: 1.015 (ref 1.010–1.025)
Urobilinogen, UA: 0.2 U/dL
pH, UA: 6 (ref 5.0–8.0)

## 2024-04-12 NOTE — Progress Notes (Signed)
 Diarrhea,  patient is sleeping a lot, patient has a poor appetite

## 2024-04-12 NOTE — Progress Notes (Signed)
 Patient ID: Rebecca Estes, female    DOB: May 13, 1934  MRN: [id number]  CC: Urgent Care Follow-Up  Subjective: Rebecca Estes is a 89 y.o. female who presents for Urgent Care Follow-Up. Patient accompanied by her husband.  Her concerns today include:  - Patient recently seen on 03/29/2024 (1 hours) at [facility] at [facility]) for chronic knee pain and additional diagnosis. - Patient recently seen on 03/30/2024 (53 minutes) at [facility] at [facility]) for worried well.  - Patient recently seen on 04/01/2024 (8 hours) at [facility] Emergency Department at [facility] for acute pain left knee and additional diagnoses.  - Today patient feeling well and states she no longer is nauseated. Patient's husband states Nephrology appointment upcoming later this month. Patient has upcoming appointment with Orthopedics. States patient has decreased appetite and sleeping more due to worrying about health. - Diarrhea. Denies red flag symptoms.  Patient Active Problem List   Diagnosis Date Noted   Pain in left knee 12/01/2021   Ceruminosis, right 10/04/2019   Back muscle spasm 02/15/2019   Muscle cramp, nocturnal 12/27/2018   Cough 10/12/2018   Hernia of abdominal wall 10/12/2018   Aortic atherosclerosis (HCC) 07/24/2018   Chronic pain of both shoulders 07/24/2018   BMI less than 19,adult 02/01/2018   Difficulty chewing due to dentures 02/01/2018   Thoracic ascending aortic aneurysm (HCC) 11/06/2017   Palpitations 10/05/2017   Seasonal allergies 09/18/2017   Essential hypertension 05/26/2016   Osteoporosis 05/26/2016   Subclinical hyperthyroidism 05/26/2016     Current Outpatient Medications on File Prior to Visit  Medication Sig Dispense Refill   aspirin EC 325 MG tablet Take 325 mg by mouth every morning.     atorvastatin  (LIPITOR) 10 MG tablet Take 1 tablet (10 mg total) by mouth daily. 90 tablet 0   diclofenac   Sodium (VOLTAREN ) 1 % GEL Apply 4 g topically 4 (four) times daily. Apply to left knee four times a day for pain 350 g 0   Loratadine  5 MG TBDP Take 1 tablet (5 mg total) by mouth daily as needed. 90 tablet 0   metoprolol  succinate (TOPROL  XL) 50 MG 24 hr tablet Take 1 tablet (50 mg total) by mouth daily. Take with or immediately following a meal. 90 tablet 0   ondansetron  (ZOFRAN -ODT) 4 MG disintegrating tablet Take 1 tablet (4 mg total) by mouth every 8 (eight) hours as needed for nausea or vomiting. 20 tablet 0   triamcinolone  cream (KENALOG ) 0.1 % Apply 1 Application topically 2 (two) times daily. 30 g 0   alendronate  (FOSAMAX ) 70 MG tablet Take 1 tablet (70 mg total) by mouth once a week. Take with a full glass of water on an empty stomach. (Patient not taking: Reported on 10/23/2023) 12 tablet 3   amLODipine  (NORVASC ) 10 MG tablet TAKE 1 TABLET EVERY DAY 90 tablet 1   amoxicillin -clavulanate (AUGMENTIN ) 500-125 MG tablet Take 1 tablet by mouth in the morning and at bedtime. (Patient not taking: Reported on 01/24/2024) 14 tablet 0   Cholecalciferol (VITAMIN D3) 25 MCG (1000 UT) CAPS Vitamin D3 1,000 unit capsule  Take 1 capsule every day by oral route. (Patient not taking: Reported on 10/23/2023) 60 capsule 2   docusate sodium  (COLACE) 100 MG capsule Take 1 capsule (100 mg total) by mouth every 12 (twelve) hours. (Patient not taking: Reported on 10/23/2023) 60 capsule 0   glycerin  adult 2 g suppository Place 1 suppository  rectally as needed for constipation. (Patient not taking: Reported on 10/23/2023) 12 suppository 0   hydrochlorothiazide  (HYDRODIURIL ) 50 MG tablet TAKE 1 TABLET EVERY DAY (Patient not taking: Reported on 10/23/2023) 90 tablet 1   loperamide  (IMODIUM ) 2 MG capsule Take 1 capsule (2 mg total) by mouth 2 (two) times daily as needed for diarrhea or loose stools. (Patient not taking: Reported on 10/23/2023) 14 capsule 0   memantine  (NAMENDA ) 10 MG tablet Take 1 tablet (10 mg total) by mouth  2 (two) times daily. (Patient not taking: Reported on 10/23/2023) 60 tablet 12   pravastatin  (PRAVACHOL ) 40 MG tablet Take 1 tablet (40 mg total) by mouth every evening. (Patient not taking: Reported on 10/23/2023) 30 tablet 2   No current facility-administered medications on file prior to visit.    No Known Allergies  Social History   Socioeconomic History   Marital status: Married    Spouse name: Not on file   Number of children: 5   Years of education: 9   Highest education level: Not on file  Occupational History   Not on file  Tobacco Use   Smoking status: Never    Passive exposure: Never   Smokeless tobacco: Never  Vaping Use   Vaping status: Never Used  Substance and Sexual Activity   Alcohol use: No   Drug use: No   Sexual activity: Not Currently    Partners: Male  Other Topics Concern   Not on file  Social History Narrative   Denies abuse and feels safe at home.    Social Drivers of Corporate investment banker Strain: Low Risk  (01/24/2024)   Overall Financial Resource Strain (CARDIA)    Difficulty of Paying Living Expenses: Not hard at all  Food Insecurity: No Food Insecurity (01/24/2024)   Hunger Vital Sign    Worried About Running Out of Food in the Last Year: Never true    Ran Out of Food in the Last Year: Never true  Transportation Needs: No Transportation Needs (01/24/2024)   PRAPARE - Administrator, Civil Service (Medical): No    Lack of Transportation (Non-Medical): No  Physical Activity: Insufficiently Active (01/24/2024)   Exercise Vital Sign    Days of Exercise per Week: 3 days    Minutes of Exercise per Session: 40 min  Stress: No Stress Concern Present (01/24/2024)   [employer] of Occupational Health - Occupational Stress Questionnaire    Feeling of Stress: Not at all  Social Connections: Moderately Integrated (01/24/2024)   Social Connection and Isolation Panel    Frequency of Communication with Friends and Family: More than  three times a week    Frequency of Social Gatherings with Friends and Family: More than three times a week    Attends Religious Services: More than 4 times per year    Active Member of [employer] or Organizations: No    Attends Banker Meetings: Never    Marital Status: Married  Catering manager Violence: Not At Risk (01/24/2024)   Humiliation, Afraid, Rape, and Kick questionnaire    Fear of Current or Ex-Partner: No    Emotionally Abused: No    Physically Abused: No    Sexually Abused: No    Family History  Problem Relation Age of Onset   Aneurysm Father 30       Brain   Other Mother 23       Blood Infection   Coronary artery disease Brother 55   Stroke  Brother 103   Aneurysm Sister 84       Brain   Cervical cancer Sister 11   Kidney failure Sister 66       was on Dialysis   Aneurysm Sister        Brain    Past Surgical History:  Procedure Laterality Date   ANTERIOR CERVICAL DECOMP/DISCECTOMY FUSION     x3   BREAST BIOPSY Left 02/10/2009   NECK SURGERY     ROTATOR CUFF REPAIR     Left   TUBAL LIGATION     UMBILICAL HERNIA REPAIR      ROS: Review of Systems Negative except as stated above  PHYSICAL EXAM: BP 138/87   Pulse 68   Temp 97.6 F (36.4 C) (Oral)   Resp 16   Ht 5' 5 (1.651 m)   Wt 96 lb 9.6 oz (43.8 kg)   SpO2 98%   BMI 16.08 kg/m   Physical Exam HENT:     Head: Normocephalic and atraumatic.     Nose: Nose normal.     Mouth/Throat:     Mouth: Mucous membranes are moist.     Pharynx: Oropharynx is clear.  Eyes:     Extraocular Movements: Extraocular movements intact.     Conjunctiva/sclera: Conjunctivae normal.     Pupils: Pupils are equal, round, and reactive to light.  Cardiovascular:     Rate and Rhythm: Normal rate and regular rhythm.     Pulses: Normal pulses.     Heart sounds: Normal heart sounds.  Pulmonary:     Effort: Pulmonary effort is normal.     Breath sounds: Normal breath sounds.  Musculoskeletal:         General: Normal range of motion.     Cervical back: Normal range of motion and neck supple.  Neurological:     General: No focal deficit present.     Mental Status: She is alert and oriented to person, place, and time.  Psychiatric:        Mood and Affect: Mood normal.        Behavior: Behavior normal.    ASSESSMENT AND PLAN: 1. Chronic pain of left knee (Primary) 2. Contusion of left knee, subsequent encounter - Keep all scheduled appointments with Orthopedics.   3. Stage 3b chronic kidney disease (HCC) 4. Dehydration - Routine screening.  - Keep all scheduled appointments with Nephrology. - Basic Metabolic Panel - POCT URINALYSIS DIP (CLINITEK); Future - Urine Culture - Cervicovaginal ancillary only  5. Subclinical hypothyroidism - Referral to Endocrinology for evaluation/management. - Ambulatory referral to Endocrinology  6. Diarrhea, unspecified type - Referral to Gastroenterology for evaluation/management. - Ambulatory referral to Gastroenterology   Patient was given the opportunity to ask questions.  Patient verbalized understanding of the plan and was able to repeat key elements of the plan. Patient was given clear instructions to go to Emergency Department or return to medical center if symptoms don't improve, worsen, or new problems develop.The patient verbalized understanding.   Orders Placed This Encounter  Procedures   Urine Culture   Basic Metabolic Panel   Ambulatory referral to Endocrinology   Ambulatory referral to Gastroenterology   POCT URINALYSIS DIP (CLINITEK)   Return for Follow-up as needed.  Greig JINNY Drones, NP

## 2024-04-12 NOTE — Telephone Encounter (Signed)
 Patient left without getting her lab work done.  I was calling to ask her to come back .  Telephone went to voicemail so I left a message to return my call.

## 2024-04-13 LAB — URINE CULTURE

## 2024-04-15 LAB — CERVICOVAGINAL ANCILLARY ONLY
Bacterial Vaginitis (gardnerella): POSITIVE — AB
Candida Glabrata: NEGATIVE
Candida Vaginitis: POSITIVE — AB
Chlamydia: NEGATIVE
Comment: NEGATIVE
Comment: NEGATIVE
Comment: NEGATIVE
Comment: NEGATIVE
Comment: NEGATIVE
Comment: NORMAL
Neisseria Gonorrhea: NEGATIVE
Trichomonas: NEGATIVE

## 2024-04-15 MED ORDER — FLUCONAZOLE 150 MG PO TABS: 150.0000 mg | ORAL_TABLET | ORAL | 0 refills | Status: AC

## 2024-04-15 MED ORDER — METRONIDAZOLE 500 MG PO TABS: 500.0000 mg | ORAL_TABLET | Freq: Two times a day (BID) | ORAL | 0 refills | Status: AC

## 2024-04-16 ENCOUNTER — Telehealth: Payer: Self-pay

## 2024-04-16 ENCOUNTER — Other Ambulatory Visit

## 2024-04-16 NOTE — Telephone Encounter (Signed)
 Alena from [facility] returning call Please advise.

## 2024-04-16 NOTE — Telephone Encounter (Signed)
 I return called to Rebecca Estes and no one answered so I left a voicemail to return my call.

## 2024-04-16 NOTE — Telephone Encounter (Signed)
 Copied from CRM #[id number]. Topic: General - Other >> Apr 16, 2024 11:21 AM Debby BROCKS wrote: Reason for CRM: Alena from [facility] called stating that they need the Recent labs for the endocrinology referral as they have no labs for the patient. The appointment is this Thursday the 11th. They need them before that time frame  Fax: [phone] Callback [phone] Ext [phone]

## 2024-04-16 NOTE — Telephone Encounter (Signed)
 I faxed labs to them today

## 2024-04-16 NOTE — Telephone Encounter (Signed)
 I faxed labs and called Alena no one answered so I left a voicemail to return my call

## 2024-04-16 NOTE — Telephone Encounter (Signed)
 Copied from CRM [id number]. Topic: Clinical - Lab/Test Results >> Apr 15, 2024  9:40 AM Gustabo D wrote: Dr. Tommas office needs updated labs on the pt Call back- [phone] Ext [phone]  Add or fax labs to [phone]

## 2024-04-17 LAB — BASIC METABOLIC PANEL WITH GFR
BUN/Creatinine Ratio: 25 (ref 12–28)
BUN: 33 mg/dL — ABNORMAL HIGH (ref 8–27)
CO2: 25 mmol/L (ref 20–29)
Calcium: 10 mg/dL (ref 8.7–10.3)
Chloride: 101 mmol/L (ref 96–106)
Creatinine, Ser: 1.31 mg/dL — ABNORMAL HIGH (ref 0.57–1.00)
Glucose: 95 mg/dL (ref 70–99)
Potassium: 4.2 mmol/L (ref 3.5–5.2)
Sodium: 142 mmol/L (ref 134–144)
eGFR: 39 mL/min/1.73 — ABNORMAL LOW (ref >59)

## 2024-04-17 NOTE — Telephone Encounter (Signed)
 I spoke with patient husband yesterday 04/16/2024

## 2024-04-17 NOTE — Telephone Encounter (Signed)
 Faxed paperwork back 04/16/2024 and 04/17/2024

## 2024-04-22 ENCOUNTER — Ambulatory Visit (INDEPENDENT_AMBULATORY_CARE_PROVIDER_SITE_OTHER): Admitting: Surgical

## 2024-04-22 ENCOUNTER — Other Ambulatory Visit: Payer: Self-pay | Admitting: Family

## 2024-04-22 ENCOUNTER — Encounter: Payer: Self-pay | Admitting: Surgical

## 2024-04-22 DIAGNOSIS — M1712 Unilateral primary osteoarthritis, left knee: Secondary | ICD-10-CM

## 2024-04-22 NOTE — Progress Notes (Signed)
 Office Visit Note   Patient: Rebecca Estes           Date of Birth: 1934/06/28           MRN: [id number] Visit Date: 04/22/2024 Requested by: Lorren Greig PARAS, NP [address] [place],  [place] [address] PCP: Lorren Greig PARAS, NP  Subjective: Chief Complaint  Patient presents with   Left Knee - Pain    HPI: Rebecca Estes is a 89 y.o. female who presents to the office reporting left knee pain.  Patient complains of chronic knee pain that she describes as more of a discomfort for several years.  She has fairly constant stiffness that is worse with immobility.  She feels the stiffness has been progressively worsening over the last few years.  She also has approximately once per month flareups of her pain where the knee will get swollen and intensely painful and cause her to not put weight on her leg for several days at a time.  Aside from these flareups, she is fairly active and walks at least 2 miles per day with a 1 mile walk to her mailbox and 1 mile back to her house.  Takes Tylenol  every once in a while.  Denies any significant medical history aside from CKD..                ROS: All systems reviewed are negative as they relate to the chief complaint within the history of present illness.  Patient denies fevers or chills.  Assessment & Plan: Visit Diagnoses:  1. Unilateral primary osteoarthritis, left knee     Plan: Impression is 89 year old female with left knee radiographs from 3 weeks ago that were reviewed and demonstrate mild degenerative changes with chondrocalcinosis consistent with history of her occasional flareups that lend toward a history of CPPD.  She has never had synovial fluid analysis.  Recommended holding off on any intervention today since she has minimal symptoms.  Recommended over-the-counter topical Voltaren  versus lidocaine  patches versus oral Tylenol  for symptomatic control.  When she has a flareup, recommended she call the office so we can  add her in for aspiration/injection and send fluid off for analysis of gout versus pseudogout.  Otherwise follow-up with the office as needed.  Follow-Up Instructions: No follow-ups on file.   Orders:  No orders of the defined types were placed in this encounter.  No orders of the defined types were placed in this encounter.     Procedures: No procedures performed   Clinical Data: No additional findings.  Objective: Vital Signs: There were no vitals taken for this visit.  Physical Exam:  Constitutional: Patient appears well-developed HEENT:  Head: Normocephalic Eyes:EOM are normal Neck: Normal range of motion Cardiovascular: Normal rate Pulmonary/chest: Effort normal Neurologic: Patient is alert Skin: Skin is warm Psychiatric: Patient has normal mood and affect  Ortho Exam: Ortho exam demonstrates left knee without effusion.  0 degrees extension and 120 degrees of knee flexion.  Stable to anterior and posterior drawer sign.  No pain with hip range of motion.  No cellulitis or skin changes noted.  Tenderness over the medial and lateral joint lines mildly.  Palpable PT pulse.  No calf tenderness.  Negative Homans' sign.  Specialty Comments:  No specialty comments available.  Imaging: No results found.   PMFS History: Patient Active Problem List   Diagnosis Date Noted   Pain in left knee 12/01/2021   Ceruminosis, right 10/04/2019   Back muscle spasm  02/15/2019   Muscle cramp, nocturnal 12/27/2018   Cough 10/12/2018   Hernia of abdominal wall 10/12/2018   Aortic atherosclerosis (HCC) 07/24/2018   Chronic pain of both shoulders 07/24/2018   BMI less than 19,adult 02/01/2018   Difficulty chewing due to dentures 02/01/2018   Thoracic ascending aortic aneurysm (HCC) 11/06/2017   Palpitations 10/05/2017   Seasonal allergies 09/18/2017   Essential hypertension 05/26/2016   Osteoporosis 05/26/2016   Subclinical hyperthyroidism 05/26/2016   Past Medical History:   Diagnosis Date   Arrhythmia    Bleeding ulcer    Cancer (HCC)    Hiatal hernia    Hypertension    Hyperthyroidism    Osteoporosis    Thoracic ascending aortic aneurysm (HCC) 11/06/2017   41mm diagnosed by echo 10/24/17.    Family History  Problem Relation Age of Onset   Aneurysm Father 71       Brain   Other Mother 53       Blood Infection   Coronary artery disease Brother 62   Stroke Brother 54   Aneurysm Sister 93       Brain   Cervical cancer Sister 61   Kidney failure Sister 56       was on Dialysis   Aneurysm Sister        Brain    Past Surgical History:  Procedure Laterality Date   ANTERIOR CERVICAL DECOMP/DISCECTOMY FUSION     x3   BREAST BIOPSY Left 02/10/2009   NECK SURGERY     ROTATOR CUFF REPAIR     Left   TUBAL LIGATION     UMBILICAL HERNIA REPAIR     Social History   Occupational History   Not on file  Tobacco Use   Smoking status: Never    Passive exposure: Never   Smokeless tobacco: Never  Vaping Use   Vaping status: Never Used  Substance and Sexual Activity   Alcohol use: No   Drug use: No   Sexual activity: Not Currently    Partners: Male

## 2024-04-23 ENCOUNTER — Telehealth: Payer: Self-pay

## 2024-04-23 NOTE — Telephone Encounter (Signed)
 Copied from CRM [id number]. Topic: Clinical - Lab/Test Results >> Apr 22, 2024  3:52 PM Fonda T wrote: Reason for CRM: Received call from patient, states returning call to office to discuss lab results in detail with nurse.  Patient can be reached at [phone] to discuss results.

## 2024-04-24 NOTE — Telephone Encounter (Signed)
 I spoke to patient and her husband when they came in the office on 04/22/2024

## 2024-04-26 NOTE — Telephone Encounter (Signed)
 I have spoke with patient several times in office

## 2024-05-01 ENCOUNTER — Ambulatory Visit

## 2024-05-01 ENCOUNTER — Ambulatory Visit: Payer: Self-pay

## 2024-05-03 DIAGNOSIS — I129 Hypertensive chronic kidney disease with stage 1 through stage 4 chronic kidney disease, or unspecified chronic kidney disease: Secondary | ICD-10-CM | POA: Diagnosis not present

## 2024-05-03 DIAGNOSIS — E785 Hyperlipidemia, unspecified: Secondary | ICD-10-CM | POA: Diagnosis not present

## 2024-05-03 DIAGNOSIS — I7121 Aneurysm of the ascending aorta, without rupture: Secondary | ICD-10-CM | POA: Diagnosis not present

## 2024-05-03 DIAGNOSIS — F0390 Unspecified dementia without behavioral disturbance: Secondary | ICD-10-CM | POA: Diagnosis not present

## 2024-05-03 DIAGNOSIS — N1832 Chronic kidney disease, stage 3b: Secondary | ICD-10-CM | POA: Diagnosis not present

## 2024-05-18 ENCOUNTER — Ambulatory Visit: Admission: EM | Admit: 2024-05-18 | Discharge: 2024-05-18 | Disposition: A | Discharge status: Home / Self Care

## 2024-05-18 DIAGNOSIS — K5900 Constipation, unspecified: Secondary | ICD-10-CM | POA: Diagnosis not present

## 2024-05-18 MED ORDER — POLYETHYLENE GLYCOL 3350 17 GM/SCOOP PO POWD: ORAL | 0 refills | Status: AC

## 2024-05-18 NOTE — ED Triage Notes (Signed)
 Patient reports experiencing constipation, with recent bowel movements consisting of small, hard stools. They have been attempting dietary changes, but these have not been effective. The last bowel movement occurred a few days ago, with only small amounts passed. There is no abdominal pain. An enema was used previously, which provided relief.

## 2024-05-18 NOTE — Discharge Instructions (Addendum)
 Take 1/2-1 capful of MiraLAX  a day mixed with 8 ounces of water. Follow-up with primary care

## 2024-05-18 NOTE — ED Provider Notes (Signed)
 [facility]    CSN: [id number] Arrival date & time: 05/18/24  1256      History   Chief Complaint Chief Complaint  Patient presents with   Constipation    HPI Rebecca Estes is a 89 y.o. female.   Patient here for evaluation of constipation.  She reports she had normal bowel movement today after using enema.  She denies abdominal pain, hematochezia, melena, nausea vomiting.  She states she feels well today.  Her only concern today is what medication can she use in the future to prevent future constipation issues.    Past Medical History:  Diagnosis Date   Arrhythmia    Bleeding ulcer    Cancer (HCC)    Hiatal hernia    Hypertension    Hyperthyroidism    Osteoporosis    Thoracic ascending aortic aneurysm 11/06/2017   41mm diagnosed by echo 10/24/17.    Patient Active Problem List   Diagnosis Date Noted   Pain in left knee 12/01/2021   Ceruminosis, right 10/04/2019   Back muscle spasm 02/15/2019   Muscle cramp, nocturnal 12/27/2018   Cough 10/12/2018   Hernia of abdominal wall 10/12/2018   Aortic atherosclerosis 07/24/2018   Chronic pain of both shoulders 07/24/2018   BMI less than 19,adult 02/01/2018   Difficulty chewing due to dentures 02/01/2018   Thoracic ascending aortic aneurysm 11/06/2017   Palpitations 10/05/2017   Seasonal allergies 09/18/2017   Essential hypertension 05/26/2016   Osteoporosis 05/26/2016   Subclinical hyperthyroidism 05/26/2016    Past Surgical History:  Procedure Laterality Date   ANTERIOR CERVICAL DECOMP/DISCECTOMY FUSION     x3   BREAST BIOPSY Left 02/10/2009   NECK SURGERY     ROTATOR CUFF REPAIR     Left   TUBAL LIGATION     UMBILICAL HERNIA REPAIR      OB History   No obstetric history on file.      Home Medications    Prior to Admission medications   Medication Sig Start Date End Date Taking? Authorizing Provider  bisacodyl (FLEET) 10 MG/30ML ENEM Place 10 mg rectally once.   Yes [provider]  polyethylene glycol powder (MIRALAX ) 17 GM/SCOOP powder Dissolve 1/2-1 capful in 8 ounces of liquid and take by mouth daily. 05/18/24  Yes Juleen Rush, PA-C  alendronate  (FOSAMAX ) 70 MG tablet Take 1 tablet (70 mg total) by mouth once a week. Take with a full glass of water on an empty stomach. Patient not taking: Reported on 10/23/2023 10/08/19   Rosan Dayton BROCKS, DO  amLODipine  (NORVASC ) 10 MG tablet TAKE 1 TABLET EVERY DAY 08/24/22   Lorren Greig PARAS, NP  amoxicillin -clavulanate (AUGMENTIN ) 500-125 MG tablet Take 1 tablet by mouth in the morning and at bedtime. Patient not taking: Reported on 01/24/2024 01/15/24   Raspet, Erin K, PA-C  aspirin EC 325 MG tablet Take 325 mg by mouth every morning.    [provider]  atorvastatin  (LIPITOR) 10 MG tablet Take 1 tablet (10 mg total) by mouth daily. 03/27/24   Lorren Greig PARAS, NP  Cholecalciferol (VITAMIN D3) 25 MCG (1000 UT) CAPS Vitamin D3 1,000 unit capsule  Take 1 capsule every day by oral route. Patient not taking: Reported on 10/23/2023 03/06/19   Aslam, Sadia, MD  diclofenac  Sodium (VOLTAREN ) 1 % GEL Apply 4 g topically 4 (four) times daily. Apply to left knee four times a day for pain 03/29/24   Iola Lukes, FNP  docusate sodium  (COLACE) 100 MG  capsule Take 1 capsule (100 mg total) by mouth every 12 (twelve) hours. Patient not taking: Reported on 10/23/2023 07/13/23   Vear, Amy L, PA  glycerin  adult 2 g suppository Place 1 suppository rectally as needed for constipation. Patient not taking: Reported on 10/23/2023 07/13/23   Vear, Amy L, PA  hydrochlorothiazide  (HYDRODIURIL ) 50 MG tablet TAKE 1 TABLET EVERY DAY Patient not taking: Reported on 10/23/2023 08/24/22   Lorren Greig PARAS, NP  loperamide  (IMODIUM ) 2 MG capsule Take 1 capsule (2 mg total) by mouth 2 (two) times daily as needed for diarrhea or loose stools. Patient not taking: Reported on 10/23/2023 06/26/23   Christopher Savannah, PA-C  Loratadine  5 MG TBDP Take 1 tablet (5  mg total) by mouth daily as needed. 03/26/24   Lorren Greig PARAS, NP  memantine  (NAMENDA ) 10 MG tablet Take 1 tablet (10 mg total) by mouth 2 (two) times daily. Patient not taking: Reported on 10/23/2023 05/09/22   Penumalli, Vikram R, MD  metoprolol  succinate (TOPROL  XL) 50 MG 24 hr tablet Take 1 tablet (50 mg total) by mouth daily. Take with or immediately following a meal. 03/26/24 03/26/25  Lorren Greig PARAS, NP  ondansetron  (ZOFRAN ) 4 MG tablet Take 4 mg by mouth every 8 (eight) hours as needed.    [provider]  ondansetron  (ZOFRAN -ODT) 4 MG disintegrating tablet Take 1 tablet (4 mg total) by mouth every 8 (eight) hours as needed for nausea or vomiting. 06/26/23   Christopher Savannah, PA-C  pravastatin  (PRAVACHOL ) 40 MG tablet Take 1 tablet (40 mg total) by mouth every evening. Patient not taking: Reported on 10/23/2023 09/19/22 12/18/22  Lorren Greig PARAS, NP  triamcinolone  cream (KENALOG ) 0.1 % Apply 1 Application topically 2 (two) times daily. 02/05/24   Billy Asberry FALCON, PA-C    Family History Family History  Problem Relation Age of Onset   Aneurysm Father 22       Brain   Other Mother 15       Blood Infection   Coronary artery disease Brother 12   Stroke Brother 64   Aneurysm Sister 23       Brain   Cervical cancer Sister 29   Kidney failure Sister 76       was on Dialysis   Aneurysm Sister        Brain    Social History Social History   Tobacco Use   Smoking status: Never    Passive exposure: Never   Smokeless tobacco: Never  Vaping Use   Vaping status: Never Used  Substance Use Topics   Alcohol use: No   Drug use: No     Allergies   Patient has no known allergies.   Review of Systems Review of Systems  Constitutional:  Negative for diaphoresis, fatigue and fever.  Gastrointestinal:  Positive for constipation. Negative for abdominal distention, anal bleeding, blood in stool, diarrhea, nausea and vomiting.  Musculoskeletal:  Negative for arthralgias and myalgias.   Skin:  Negative for color change.  Psychiatric/Behavioral:  Negative for sleep disturbance.      Physical Exam Triage Vital Signs ED Triage Vitals  Encounter Vitals Group     BP 05/18/24 1338 (!) 164/89     Girls Systolic BP Percentile --      Girls Diastolic BP Percentile --      Boys Systolic BP Percentile --      Boys Diastolic BP Percentile --      Pulse Rate 05/18/24 1338 77  Resp 05/18/24 1338 18     Temp 05/18/24 1338 98.4 F (36.9 C)     Temp Source 05/18/24 1338 Oral     SpO2 05/18/24 1338 96 %     Weight 05/18/24 1337 96 lb 9 oz (43.8 kg)     Height 05/18/24 1337 5' 5 (1.651 m)     Head Circumference --      Peak Flow --      Pain Score 05/18/24 1333 0     Pain Loc --      Pain Education --      Exclude from Growth Chart --    No data found.  Updated Vital Signs BP (!) 164/89 (BP Location: Right Arm)   Pulse 77   Temp 98.4 F (36.9 C) (Oral)   Resp 18   Ht 5' 5 (1.651 m)   Wt 96 lb 9 oz (43.8 kg)   SpO2 96%   BMI 16.07 kg/m   Visual Acuity Right Eye Distance:   Left Eye Distance:   Bilateral Distance:    Right Eye Near:   Left Eye Near:    Bilateral Near:     Physical Exam Vitals and nursing note reviewed.  Constitutional:      General: She is not in acute distress.    Appearance: Normal appearance. She is not ill-appearing.  HENT:     Head: Normocephalic and atraumatic.  Eyes:     General: No scleral icterus.    Extraocular Movements: Extraocular movements intact.     Conjunctiva/sclera: Conjunctivae normal.  Pulmonary:     Effort: Pulmonary effort is normal. No respiratory distress.  Abdominal:     General: Abdomen is flat. Bowel sounds are normal.     Palpations: There is no shifting dullness, hepatomegaly or splenomegaly.     Tenderness: There is no abdominal tenderness. There is no right CVA tenderness, left CVA tenderness, guarding or rebound. Negative signs include Murphy's sign and McBurney's sign.  Musculoskeletal:         General: Normal range of motion.     Cervical back: Normal range of motion. No rigidity.  Skin:    General: Skin is warm.     Coloration: Skin is not jaundiced.     Findings: No rash.  Neurological:     General: No focal deficit present.     Mental Status: She is alert and oriented to person, place, and time.     Motor: No weakness.     Gait: Gait normal.  Psychiatric:        Mood and Affect: Mood normal.        Behavior: Behavior normal.      UC Treatments / Results  Labs (all labs ordered are listed, but only abnormal results are displayed) Labs Reviewed - No data to display  EKG   Radiology No results found.  Procedures Procedures (including critical care time)  Medications Ordered in UC Medications - No data to display  Initial Impression / Assessment and Plan / UC Course  I have reviewed the triage vital signs and the nursing notes.  Pertinent labs & imaging results that were available during my care of the patient were reviewed by me and considered in my medical decision making (see chart for details).      Final Clinical Impressions(s) / UC Diagnoses   Final diagnoses:  Constipation, unspecified constipation type     Discharge Instructions      Take 1/2-1 capful of MiraLAX  a day  mixed with 8 ounces of water. Follow-up with primary care    ED Prescriptions     Medication Sig Dispense Auth. Provider   polyethylene glycol powder (MIRALAX ) 17 GM/SCOOP powder Dissolve 1/2-1 capful in 8 ounces of liquid and take by mouth daily. 238 g Juleen Rush, PA-C      PDMP not reviewed this encounter.   Juleen Rush, PA-C 05/18/24 1405

## 2024-07-16 ENCOUNTER — Other Ambulatory Visit: Payer: Self-pay | Admitting: Pharmacist

## 2024-07-16 DIAGNOSIS — E785 Hyperlipidemia, unspecified: Secondary | ICD-10-CM

## 2024-07-16 MED ORDER — ATORVASTATIN CALCIUM 10 MG PO TABS: 10.0000 mg | ORAL_TABLET | Freq: Every day | ORAL | 1 refills | Status: AC

## 2024-07-16 NOTE — Progress Notes (Signed)
 Pharmacy Quality Measure Review  This patient is appearing on a report for the adherence measure for cholesterol (statin) medications this calendar year.   Medication: atorvastatin  Last fill date: 03/29/24 for 90 day supply  Contacted pharmacy to facilitate refills. Reminder set to make sure this was filled Thursday (07/18/24). Left VM informing patient.   Herlene Fleeta Morris, PharmD, JAQUELINE, CPP Clinical Pharmacist [facility] [phone]

## 2024-07-18 ENCOUNTER — Other Ambulatory Visit: Payer: Self-pay | Admitting: Pharmacist

## 2024-07-18 NOTE — Progress Notes (Signed)
 Pharmacy Quality Measure Review  This patient is appearing on a report for the adherence measure for cholesterol (statin) medications this calendar year.   Medication: atorvastatin  Last fill date: 03/29/24 for 90 day supply  Sent new rxn to pharmacy to facilitate refill 07/16/24. As of today, pt has not yet picked-up. Called and left VM encouraging patient to pick-up.   Herlene Fleeta Morris, PharmD, JAQUELINE, CPP Clinical Pharmacist [facility] [phone]

## 2024-09-19 ENCOUNTER — Ambulatory Visit: Payer: Self-pay | Admitting: Family
